# Patient Record
Sex: Female | Born: 1960
Health system: Southern US, Community
[De-identification: ages and names within clinical notes are randomized; demographics above are authoritative.]

## PROBLEM LIST (undated history)

## (undated) DIAGNOSIS — F419 Anxiety disorder, unspecified: Secondary | ICD-10-CM

## (undated) DIAGNOSIS — F411 Generalized anxiety disorder: Secondary | ICD-10-CM

## (undated) DIAGNOSIS — E079 Disorder of thyroid, unspecified: Secondary | ICD-10-CM

## (undated) DIAGNOSIS — F312 Bipolar disorder, current episode manic severe with psychotic features: Secondary | ICD-10-CM

## (undated) DIAGNOSIS — D229 Melanocytic nevi, unspecified: Secondary | ICD-10-CM

## (undated) DIAGNOSIS — Z950 Presence of cardiac pacemaker: Secondary | ICD-10-CM

## (undated) DIAGNOSIS — F209 Schizophrenia, unspecified: Secondary | ICD-10-CM

## (undated) DIAGNOSIS — I442 Atrioventricular block, complete: Secondary | ICD-10-CM

## (undated) HISTORY — DX: Anxiety disorder, unspecified: F41.9

## (undated) HISTORY — DX: Disorder of thyroid, unspecified: E07.9

## (undated) HISTORY — DX: Melanocytic nevi, unspecified: D22.9

## (undated) HISTORY — DX: Presence of cardiac pacemaker: Z95.0

## (undated) HISTORY — DX: Atrioventricular block, complete: I44.2

## (undated) HISTORY — PX: COLONOSCOPY: SHX174

---

## 1999-05-20 ENCOUNTER — Other Ambulatory Visit: Admission: RE | Admit: 1999-05-20 | Discharge: 1999-05-20 | Payer: Self-pay | Admitting: Obstetrics and Gynecology

## 1999-05-20 ENCOUNTER — Encounter (INDEPENDENT_AMBULATORY_CARE_PROVIDER_SITE_OTHER): Payer: Self-pay | Admitting: Specialist

## 2000-06-07 ENCOUNTER — Other Ambulatory Visit: Admission: RE | Admit: 2000-06-07 | Discharge: 2000-06-07 | Payer: Self-pay | Admitting: Obstetrics and Gynecology

## 2001-08-15 ENCOUNTER — Other Ambulatory Visit: Admission: RE | Admit: 2001-08-15 | Discharge: 2001-08-15 | Payer: Self-pay | Admitting: Obstetrics and Gynecology

## 2003-01-02 ENCOUNTER — Other Ambulatory Visit: Admission: RE | Admit: 2003-01-02 | Discharge: 2003-01-02 | Payer: Self-pay | Admitting: Obstetrics and Gynecology

## 2004-02-13 ENCOUNTER — Other Ambulatory Visit: Admission: RE | Admit: 2004-02-13 | Discharge: 2004-02-13 | Payer: Self-pay | Admitting: Obstetrics and Gynecology

## 2005-07-17 ENCOUNTER — Other Ambulatory Visit: Admission: RE | Admit: 2005-07-17 | Discharge: 2005-07-17 | Payer: Self-pay | Admitting: Obstetrics and Gynecology

## 2005-08-04 ENCOUNTER — Ambulatory Visit: Payer: Self-pay | Admitting: Gastroenterology

## 2005-08-19 ENCOUNTER — Ambulatory Visit: Payer: Self-pay | Admitting: Gastroenterology

## 2005-10-28 ENCOUNTER — Encounter: Payer: Self-pay | Admitting: Internal Medicine

## 2006-01-21 ENCOUNTER — Encounter: Admission: RE | Admit: 2006-01-21 | Discharge: 2006-01-21 | Payer: Self-pay | Admitting: Family Medicine

## 2006-03-05 ENCOUNTER — Ambulatory Visit: Payer: Self-pay | Admitting: Family Medicine

## 2006-04-28 ENCOUNTER — Ambulatory Visit: Payer: Self-pay | Admitting: Family Medicine

## 2006-10-17 DIAGNOSIS — F411 Generalized anxiety disorder: Secondary | ICD-10-CM | POA: Insufficient documentation

## 2006-10-29 ENCOUNTER — Ambulatory Visit: Payer: Self-pay | Admitting: Internal Medicine

## 2007-01-13 ENCOUNTER — Ambulatory Visit: Payer: Self-pay | Admitting: Internal Medicine

## 2007-01-13 LAB — CONVERTED CEMR LAB
BUN: 15 mg/dL (ref 6–23)
Basophils Absolute: 0 10*3/uL (ref 0.0–0.1)
Basophils Relative: 0 % (ref 0.0–1.0)
CO2: 29 meq/L (ref 19–32)
Calcium: 9.3 mg/dL (ref 8.4–10.5)
Chloride: 105 meq/L (ref 96–112)
Creatinine, Ser: 0.9 mg/dL (ref 0.4–1.2)
Eosinophils Absolute: 0.1 10*3/uL (ref 0.0–0.6)
Eosinophils Relative: 0.9 % (ref 0.0–5.0)
GFR calc Af Amer: 87 mL/min
GFR calc non Af Amer: 72 mL/min
Glucose, Bld: 97 mg/dL (ref 70–99)
HCT: 39.9 % (ref 36.0–46.0)
Hemoglobin: 13.2 g/dL (ref 12.0–15.0)
Lymphocytes Relative: 21.6 % (ref 12.0–46.0)
MCHC: 33 g/dL (ref 30.0–36.0)
MCV: 91.2 fL (ref 78.0–100.0)
Monocytes Absolute: 0.7 10*3/uL (ref 0.2–0.7)
Monocytes Relative: 8 % (ref 3.0–11.0)
Neutro Abs: 5.9 10*3/uL (ref 1.4–7.7)
Neutrophils Relative %: 69.5 % (ref 43.0–77.0)
Platelets: 292 10*3/uL (ref 150–400)
Potassium: 4.4 meq/L (ref 3.5–5.1)
RBC: 4.38 M/uL (ref 3.87–5.11)
RDW: 12.6 % (ref 11.5–14.6)
Sodium: 139 meq/L (ref 135–145)
TSH: 5.31 microintl units/mL (ref 0.35–5.50)
WBC: 8.5 10*3/uL (ref 4.5–10.5)

## 2007-02-03 ENCOUNTER — Ambulatory Visit: Payer: Self-pay | Admitting: Family Medicine

## 2007-03-10 ENCOUNTER — Encounter: Payer: Self-pay | Admitting: Internal Medicine

## 2007-07-04 ENCOUNTER — Ambulatory Visit: Payer: Self-pay | Admitting: Family Medicine

## 2007-07-12 ENCOUNTER — Encounter (INDEPENDENT_AMBULATORY_CARE_PROVIDER_SITE_OTHER): Payer: Self-pay | Admitting: Family Medicine

## 2007-10-12 ENCOUNTER — Ambulatory Visit: Payer: Self-pay | Admitting: Family Medicine

## 2007-10-12 LAB — CONVERTED CEMR LAB: Rapid Strep: NEGATIVE

## 2007-11-17 ENCOUNTER — Ambulatory Visit: Payer: Self-pay | Admitting: Internal Medicine

## 2008-01-09 ENCOUNTER — Ambulatory Visit: Payer: Self-pay | Admitting: Internal Medicine

## 2008-06-27 ENCOUNTER — Ambulatory Visit: Payer: Self-pay | Admitting: Internal Medicine

## 2008-07-16 ENCOUNTER — Encounter: Payer: Self-pay | Admitting: Internal Medicine

## 2008-07-24 ENCOUNTER — Telehealth (INDEPENDENT_AMBULATORY_CARE_PROVIDER_SITE_OTHER): Payer: Self-pay | Admitting: *Deleted

## 2008-10-26 ENCOUNTER — Encounter: Payer: Self-pay | Admitting: Internal Medicine

## 2009-07-03 ENCOUNTER — Ambulatory Visit: Payer: Self-pay | Admitting: Internal Medicine

## 2009-07-24 ENCOUNTER — Encounter: Payer: Self-pay | Admitting: Internal Medicine

## 2009-10-07 ENCOUNTER — Encounter: Payer: Self-pay | Admitting: Internal Medicine

## 2009-12-10 ENCOUNTER — Ambulatory Visit: Payer: Self-pay | Admitting: Internal Medicine

## 2009-12-10 ENCOUNTER — Emergency Department (HOSPITAL_BASED_OUTPATIENT_CLINIC_OR_DEPARTMENT_OTHER): Admission: EM | Admit: 2009-12-10 | Discharge: 2009-12-10 | Payer: Self-pay | Admitting: Emergency Medicine

## 2010-02-18 ENCOUNTER — Telehealth (INDEPENDENT_AMBULATORY_CARE_PROVIDER_SITE_OTHER): Payer: Self-pay | Admitting: *Deleted

## 2010-02-25 ENCOUNTER — Telehealth (INDEPENDENT_AMBULATORY_CARE_PROVIDER_SITE_OTHER): Payer: Self-pay | Admitting: *Deleted

## 2010-09-17 ENCOUNTER — Other Ambulatory Visit: Payer: Self-pay | Admitting: Internal Medicine

## 2010-09-17 ENCOUNTER — Ambulatory Visit
Admission: RE | Admit: 2010-09-17 | Discharge: 2010-09-17 | Payer: Self-pay | Source: Home / Self Care | Attending: Internal Medicine | Admitting: Internal Medicine

## 2010-09-17 ENCOUNTER — Encounter: Payer: Self-pay | Admitting: Internal Medicine

## 2010-09-17 DIAGNOSIS — R55 Syncope and collapse: Secondary | ICD-10-CM | POA: Insufficient documentation

## 2010-09-18 LAB — BASIC METABOLIC PANEL
BUN: 15 mg/dL (ref 6–23)
CO2: 29 mEq/L (ref 19–32)
Calcium: 9.5 mg/dL (ref 8.4–10.5)
Chloride: 99 mEq/L (ref 96–112)
Creatinine, Ser: 0.8 mg/dL (ref 0.4–1.2)
GFR: 77.91 mL/min (ref >60.00)
Glucose, Bld: 83 mg/dL (ref 70–99)
Potassium: 3.9 mEq/L (ref 3.5–5.1)
Sodium: 136 mEq/L (ref 135–145)

## 2010-09-18 LAB — CBC WITH DIFFERENTIAL/PLATELET
Basophils Absolute: 0 10*3/uL (ref 0.0–0.1)
Basophils Relative: 0.3 % (ref 0.0–3.0)
Eosinophils Absolute: 0 10*3/uL (ref 0.0–0.7)
Eosinophils Relative: 0.7 % (ref 0.0–5.0)
HCT: 40.9 % (ref 36.0–46.0)
Hemoglobin: 13.8 g/dL (ref 12.0–15.0)
Lymphocytes Relative: 34.6 % (ref 12.0–46.0)
Lymphs Abs: 2.5 10*3/uL (ref 0.7–4.0)
MCHC: 33.8 g/dL (ref 30.0–36.0)
MCV: 93.1 fl (ref 78.0–100.0)
Monocytes Absolute: 1 10*3/uL (ref 0.1–1.0)
Monocytes Relative: 13.3 % — ABNORMAL HIGH (ref 3.0–12.0)
Neutro Abs: 3.7 10*3/uL (ref 1.4–7.7)
Neutrophils Relative %: 51.1 % (ref 43.0–77.0)
Platelets: 287 10*3/uL (ref 150.0–400.0)
RBC: 4.39 Mil/uL (ref 3.87–5.11)
RDW: 13.4 % (ref 11.5–14.6)
WBC: 7.3 10*3/uL (ref 4.5–10.5)

## 2010-09-18 LAB — ALT: ALT: 21 U/L (ref 0–35)

## 2010-09-18 LAB — AST: AST: 20 U/L (ref 0–37)

## 2010-09-23 ENCOUNTER — Ambulatory Visit (HOSPITAL_COMMUNITY): Admission: RE | Admit: 2010-09-23 | Payer: Self-pay | Source: Home / Self Care | Admitting: Internal Medicine

## 2010-09-29 ENCOUNTER — Ambulatory Visit (HOSPITAL_COMMUNITY)
Admission: RE | Admit: 2010-09-29 | Discharge: 2010-09-29 | Payer: Self-pay | Source: Home / Self Care | Attending: Internal Medicine | Admitting: Internal Medicine

## 2010-09-29 ENCOUNTER — Ambulatory Visit: Admission: RE | Admit: 2010-09-29 | Discharge: 2010-09-29 | Payer: Self-pay | Source: Home / Self Care

## 2010-09-30 NOTE — Assessment & Plan Note (Signed)
Summary: swh   Vital Signs:  Patient profile:   50 year old female Weight:      136.6 pounds Temp:     98.8 degrees F BP sitting:   146 / 92  Vitals Entered By: Shary Decamp (December 10, 2009 12:58 PM) CC: dog bite yesterday, went to [facility], rx'd augmentin   History of Present Illness: status-post a dog bite yesterday went to the ER, had a tetanus shot, was prescribed Augmentin she already find out that the dog has been vaccinated against rabies.  Current Medications (verified): 1)  Risperdal 1 Mg Tabs (Risperidone) .Marland Kitchen.. 1 By Mouth Qhs 2)  Alprazolam 1 Mg  Tabs (Alprazolam) .... Prn 3)  Vitamins/supplements  Allergies (verified): No Known Drug Allergies  Past History:  Past Medical History: Anxiety, lost a child , sees psych not on BCP thyroid dz? getting checked at Dr Talmage Nap  (3-11)  Past Surgical History: c-section  Social History: born in [place] Married, getting dovorce  lives w/ parents  2 child, lost one   Review of Systems       denies any fever or discharge from the dog bite  Psych:  still dealing with anxiety, symptoms worse for the last 24 hours due to dog  bite.  Physical Exam  General:  alert and well-developed.   Extremities:  the stylet the left leg: Has two superficial wounds, no redness, warmness, no discharge.  Mild swelling around the wounds Psych:  Oriented X3, memory intact for recent and remote, normally interactive, good eye contact, and not depressed appearing.  slightly anxious appearing   Impression & Recommendations:  Problem # 1:  DOG BITE (ICD-E906.0) so far doing well the patient is reluctnt to take antibiotics because she took antibiotics for UTI a few weeks ago and got a yeast infection explained her that is possible that the wound gets  infected but she is still reluctant to take  augmentin  we agree that she will monitor the wound on call me if there is any signs of infection  Problem # 2:  ROUTINE GENERAL MEDICAL  EXAM@HEALTH  CARE FACL (ICD-V70.0) Td yesterday at the ER sees gyn routinely  Problem # 3:  ANXIETY (ICD-300.00) anxiety slightly worse for the last 24 hours, counseled Her updated medication list for this problem includes:    Alprazolam 1 Mg Tabs (Alprazolam) .Marland Kitchen... Prn  Complete Medication List: 1)  Risperdal 1 Mg Tabs (Risperidone) .Marland Kitchen.. 1 by mouth qhs 2)  Alprazolam 1 Mg Tabs (Alprazolam) .... Prn 3)  Vitamins/supplements    Immunization History:  Tetanus/Td Immunization History:    Tetanus/Td:  [facility] (12/09/2009)

## 2010-09-30 NOTE — Progress Notes (Signed)
  Phone Note Other Incoming   Request: Send information Summary of Call: Request for records received from [facility]. Request forwarded to [facility].     

## 2010-09-30 NOTE — Progress Notes (Signed)
Summary: INS PAPERWORK TO BE FILLED OUT  Phone Note Call from Patient Call back at Home Phone [phone]   Summary of Call: PATIENT DROPPED OFF FORM TO BE FILLED OUT BY DR PAZ ABOUT HER DOG BITE FROM APRIL 2011---  HER INSURANCE COMPANY NEEDS IT  WILL TAKE TO [facility] [place] Initial call taken by: Jerolyn Shin,  February 18, 2010 11:24 AM  Follow-up for Phone Call        given to dr paz awaiting signature.................Marland KitchenFelecia Deloach CMA  February 19, 2010 10:38 AM   done Torrington E. Paz MD  February 21, 2010 3:06 PM   Additional Follow-up for Phone Call Additional follow up Details #1::        Pt is aware paperwork is upfront for her to pick up. Army Fossa CMA  February 25, 2010 5:00 PM

## 2010-09-30 NOTE — Letter (Signed)
Summary: [facility]   Imported By: Lanelle Bal 12/18/2009 10:19:44  _____________________________________________________________________  External Attachment:    Type:   Image     Comment:   External Document

## 2010-10-02 NOTE — Assessment & Plan Note (Signed)
Summary: FOR FAINTING//PH   Vital Signs:  Patient profile:   50 year old female Weight:      154.13 pounds Pulse rate:   80 / minute Pulse rhythm:   regular BP sitting:   122 / 80  (left arm) Cuff size:   regular  Vitals Entered By: Army Fossa CMA (September 17, 2010 2:53 PM) CC: Pt here "fainted" last week at gym. Comments refill xanax [facility] [address]    History of Present Illness: a week ago, the patient was jogging  in the treadmil  for 8 minutes then suddenly she felt flushed and immediately after she fainted, landed on her face, no other injuries. She was immediately assisted , LOC lasted seconds (?) . There were  no associated seizure, postictal state,tongue bite. no bladder or bowel incontinence. she used to run quite a bit but this is the first time she exercises since October 2011. Prior to the syncope, she had 2 cups of coffee and a muffin  ROS No recent fever or fatigue No recent nausea, vomiting, diarrhea No taking any new medicines No chest pain or palpitations No recent headache, slurred speech or motor deficits   Current Medications (verified): 1)  Risperdal 1 Mg Tabs (Risperidone) .Marland Kitchen.. 1 By Mouth Qhs 2)  Alprazolam 1 Mg  Tabs (Alprazolam) .... Prn  Allergies (verified): No Known Drug Allergies  Past History:  Past Medical History: Reviewed history from 12/10/2009 and no changes required. Anxiety, lost a child , sees psych not on BCP thyroid dz? getting checked at Dr Talmage Nap  (3-11)  Past Surgical History: Reviewed history from 12/10/2009 and no changes required. c-section  Family History: CAD--no DM-- +  Social History: born in [place] divorced  lives w/ parents  2 child, lost one  tobacco-- no ETOH-- socially  Physical Exam  General:  alert and well-developed.   Neck:  no masses and no thyromegaly.   Lungs:  normal respiratory effort, no intercostal retractions, no accessory muscle use, and normal breath sounds.   Heart:  normal  rate, regular rhythm, no murmur, and no gallop.   Abdomen:  soft, normal bowel sounds, no distention, no masses, no guarding, and no rigidity.   Extremities:  nuo  lower extremity edema Neurologic:  alert & oriented X3, strength normal in all extremities, and gait normal.   Psych:  not anxious appearing and not depressed appearing.     Impression & Recommendations:  Problem # 1:  SYNCOPE (ICD-780.2) syncope after a 8 minute jog. no evidence of seizure  by history, CV review of systems essentially negative. EKG LBBB, no old EKGs Plan: Aspirin Echocardiogram cardiology referral ER if symptoms recur  ------------------------------addendum: paper chart reviewed, she had a LBBB before. plan is the same  Orders: Venipuncture (04540) TLB-BMP (Basic Metabolic Panel-BMET) (80048-METABOL) TLB-CBC Platelet - w/Differential (85025-CBCD) TLB-ALT (SGPT) (84460-ALT) TLB-AST (SGOT) (84450-SGOT) Specimen Handling (98119) EKG w/ Interpretation (93000) Cardiology Referral (Cardiology) Cardiology Referral (Cardiology)  Problem # 2:  ANXIETY (ICD-300.00) per psych, request a RF of xanax, ok #30 Her updated medication list for this problem includes:    Alprazolam 1 Mg Tabs (Alprazolam) .Marland Kitchen... Prn  Complete Medication List: 1)  Risperdal 1 Mg Tabs (Risperidone) .Marland Kitchen.. 1 by mouth qhs 2)  Alprazolam 1 Mg Tabs (Alprazolam) .... Prn 3)  Aspirin 81 Mg Tbec (Aspirin) .... One by mouth daily  Patient Instructions: 1)  go to the ER if you faint  again 2)  Your due for a complete physical at your  convenience Prescriptions: ALPRAZOLAM 1 MG  TABS (ALPRAZOLAM) prn  #30 x 0   Entered and Authorized by:   Nolon Rod. Ermalinda Joubert MD   Signed by:   Nolon Rod. Shanaiya Bene MD on 09/17/2010   Method used:   Print then Give to Patient   RxID:   [id number]    Orders Added: 1)  Venipuncture [14782] 2)  TLB-BMP (Basic Metabolic Panel-BMET) [80048-METABOL] 3)  TLB-CBC Platelet - w/Differential [85025-CBCD] 4)  TLB-ALT (SGPT)  [84460-ALT] 5)  TLB-AST (SGOT) [84450-SGOT] 6)  Specimen Handling [99000] 7)  EKG w/ Interpretation [93000] 8)  Cardiology Referral [Cardiology] 9)  Cardiology Referral [Cardiology] 10)  Est. Patient Level IV [95621]

## 2010-10-09 ENCOUNTER — Encounter: Payer: Self-pay | Admitting: Cardiology

## 2010-10-09 ENCOUNTER — Ambulatory Visit (INDEPENDENT_AMBULATORY_CARE_PROVIDER_SITE_OTHER): Payer: BC Managed Care – PPO | Admitting: Cardiology

## 2010-10-09 DIAGNOSIS — I5022 Chronic systolic (congestive) heart failure: Secondary | ICD-10-CM | POA: Insufficient documentation

## 2010-10-13 ENCOUNTER — Encounter (INDEPENDENT_AMBULATORY_CARE_PROVIDER_SITE_OTHER): Payer: BC Managed Care – PPO

## 2010-10-13 ENCOUNTER — Other Ambulatory Visit: Payer: Self-pay

## 2010-10-13 ENCOUNTER — Encounter: Payer: Self-pay | Admitting: Cardiology

## 2010-10-13 ENCOUNTER — Other Ambulatory Visit (INDEPENDENT_AMBULATORY_CARE_PROVIDER_SITE_OTHER): Payer: BC Managed Care – PPO

## 2010-10-13 DIAGNOSIS — I509 Heart failure, unspecified: Secondary | ICD-10-CM

## 2010-10-13 DIAGNOSIS — R55 Syncope and collapse: Secondary | ICD-10-CM

## 2010-10-13 DIAGNOSIS — I5023 Acute on chronic systolic (congestive) heart failure: Secondary | ICD-10-CM | POA: Insufficient documentation

## 2010-10-13 DIAGNOSIS — I5022 Chronic systolic (congestive) heart failure: Secondary | ICD-10-CM

## 2010-10-13 LAB — IBC PANEL
Iron: 113 ug/dL (ref 42–145)
Saturation Ratios: 33.7 % (ref 20.0–50.0)

## 2010-10-13 LAB — CBC WITH DIFFERENTIAL/PLATELET
Basophils Absolute: 0 10*3/uL (ref 0.0–0.1)
Lymphocytes Relative: 20.8 % (ref 12.0–46.0)
Monocytes Relative: 6.9 % (ref 3.0–12.0)
Platelets: 265 10*3/uL (ref 150.0–400.0)
RDW: 13 % (ref 11.5–14.6)

## 2010-10-13 LAB — BRAIN NATRIURETIC PEPTIDE: Pro B Natriuretic peptide (BNP): 28.8 pg/mL (ref 0.0–100.0)

## 2010-10-13 LAB — FERRITIN: Ferritin: 46.3 ng/mL (ref 10.0–291.0)

## 2010-10-13 LAB — BASIC METABOLIC PANEL
CO2: 27 mEq/L (ref 19–32)
Calcium: 9.1 mg/dL (ref 8.4–10.5)
Chloride: 99 mEq/L (ref 96–112)
Sodium: 141 mEq/L (ref 135–145)

## 2010-10-13 LAB — PROTIME-INR: INR: 1 ratio (ref 0.8–1.0)

## 2010-10-14 LAB — TSH: TSH: 3.83 u[IU]/mL (ref 0.35–5.50)

## 2010-10-16 ENCOUNTER — Telehealth (INDEPENDENT_AMBULATORY_CARE_PROVIDER_SITE_OTHER): Payer: Self-pay | Admitting: *Deleted

## 2010-10-16 ENCOUNTER — Telehealth: Payer: Self-pay | Admitting: Cardiology

## 2010-10-16 NOTE — Assessment & Plan Note (Signed)
Summary: np6 syncope pt has bcbs. gd / per renee office [phone]/gd / akw  Medications Added ALPRAZOLAM 1 MG  TABS (ALPRAZOLAM) one daily prn TYLENOL PM EXTRA STRENGTH 500-25 MG TABS (DIPHENHYDRAMINE-APAP (SLEEP)) take one tablet as needed COREG 3.125 MG TABS (CARVEDILOL) one twice a day LISINOPRIL 5 MG TABS (LISINOPRIL) one-half tablet daily      Allergies Added: ! Traci Sermon  Primary Provider:  Dr. Clotilde Dieter  CC:  syncope-.  History of Present Illness: 50 yo with history of LBBB and schizophreniform disorder presents for evaluation of abnormal echocardiogram and syncopal episode.  Several weeks ago in 1/12 while walking on a treadmill, patient felt lightheaded and flushed and passed out.  She landed on her face.  She was unconscious only for a few seconds she thinks.  She has had no syncopal or presyncopal episodes prior or since then.  She does not get exertional dyspnea or chest pain.  She walks for exercise.  She does not smoke.  She had a stress test about 4 years ago in [facility] but is not sure why (possibly because of LBBB).    As part of the workup for her syncopal episode, she had an echocardiogram.  This showed diffuse global LV hypokinesis.  EF was reported as 35-40%.  It looks more like 30% at the most to me.  As mentioned above, she has no history of chest pain.  No recent URI-type symptoms.  No history of HTN.  No family history of cardiomyopathy.  She has a LBBB on ECG.  Per Dr. Leta Jungling notes, this has been found in the past as well.   ECG: NSR, LBBB  Labs (1/12): K 3.9, creatinine 0.8  Current Medications (verified): 1)  Risperdal 1 Mg Tabs (Risperidone) .Marland Kitchen.. 1 By Mouth Qhs 2)  Alprazolam 1 Mg  Tabs (Alprazolam) .... Prn 3)  Aspirin 81 Mg Tbec (Aspirin) .... One By Mouth Daily 4)  Tylenol Pm Extra Strength 500-25 Mg Tabs (Diphenhydramine-Apap (Sleep)) .... Take One Tablet As Needed  Allergies (verified): 1)  ! * Clarinex  Past History:  Past Medical History: 1.  Anxiety 2. 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 thyroid dz? getting checked at Dr Talmage Nap  (3-11) 4. LBBB: This seems to be chronic. 5. Cardiomyopathy: Echo (1/12) with diffuse hypokinesis and EF reported as 35-40% (looks like 30% at the most to me), grade I diastolic dysfunction, mild mitral regurgitation.  6. Syncope while walking on treadmill.   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   Vital Signs:  Patient profile:   50 year old female Height:      62 inches Weight:      154 pounds BMI:     28.27 Pulse rate:   78 / minute Pulse (ortho):   82 / minute Pulse rhythm:   regular BP sitting:   110 / 70  (left arm) BP standing:   109 / 75  Vitals Entered By: Judithe Modest CMA (October 09, 2010 12:15 PM)  Serial Vital Signs/Assessments:  Time      Position  BP       Pulse  Resp  Temp     By 12:35  PM  Lying LA  118/82   75                    Judithe Modest CMA 12:35 PM  Sitting   109/75   76                    Judithe Modest CMA 12:35 PM  Standing  109/75   82                    Amanda Trulove CMA  Comments: 12:35 PM 2 minutes-102/77 HR 95 3 minutes-100/73 HR 98  Pt reported that she felt normal except for feeling a little anxious By: Judithe Modest CMA    Physical Exam  General:  Well developed, well nourished, in no acute distress. Head:  normocephalic and atraumatic Nose:  no deformity, discharge, inflammation, or lesions Mouth:  Teeth, gums and palate normal. Oral mucosa normal. Neck:  Neck supple, no JVD. No masses, thyromegaly or abnormal cervical nodes. Lungs:  Clear bilaterally to auscultation and percussion. Heart:  Non-displaced PMI, chest non-tender;  regular rate and rhythm, S1, S2 without murmurs, rubs or gallops. Carotid upstroke normal, no bruit. Pedals normal pulses. No edema, no varicosities. Abdomen:  Bowel sounds positive; abdomen soft and non-tender without masses, organomegaly, or hernias noted. No hepatosplenomegaly. Extremities:  No clubbing or cyanosis. Neurologic:  Alert and oriented x 3. Skin:  Intact without lesions or rashes. Psych:  Normal affect.   Impression & Recommendations:  Problem # 1:  CHRONIC SYSTOLIC HEART FAILURE (ICD-428.22) Dilated cardiomyopathy with EF appearing to be at most 30% by echo.  She has a LBBB which is chronic (per Dr. Leta Jungling notes, was seen on another ECG in the paper chart).  She has no significant chest pain or exertional dyspnea.  She is not volume overloaded on exam.  The cause of her cardiomyopathy is not readily apparent.  She does not have any significant risk factors for CAD.  She will need to start on medications to try to strengthen the heart and we will explore potential causes of her cardiomyopathy.  Differential includes CAD, prior myocarditis, infiltrative disease, familial cardiomyopathy (though no family member with known cardiomyopathy).  - Left heart catheterization to definitively rule in or out CAD.  - Check TSH, iron studies, SPEP, HIV, ANA - If LHC shows no coronary disease, would consider cardiac MRI to assess for infiltrative disease or myocarditis.  - Start Coreg 3.125 mg two times a day and lisinopril 2.5 mg daily.   Problem # 2:  SYNCOPE (ICD-780.2) Worrisome that this occurred in the setting of exertion, a LBBB, and a dilated cardiomyopathy.  Would certainly be very concerned about the possibility of VT.  Will get a 3 week event monitor.    Other Orders: Cardiac Catheterization (Cardiac Cath) Event (Event)  Patient Instructions: 1)  Your physician has recommended you make the following change in your medication:  2)  Start Coreg(carvedilol) 3.125mg  twice a day. 3)   Start Lisinopril 2.5mg  daily--this will be one-half of a 5mg  tablet daily. 4)  Return for lab Monday or Tuesday of next week---BMP-CBC-PT-TSH-ANA-Iron-IBC-Ferritin-BNP-HIV--428.22 5)  Your physician has requested that you have a cardiac catheterization.  Cardiac catheterization is used to diagnose and/or treat various heart conditions. Doctors may recommend this procedure for a number of different reasons. The most common reason is to evaluate chest pain. Chest pain can be a symptom of coronary artery disease (CAD), and cardiac catheterization can show  whether plaque is narrowing or blocking your heart's arteries. This procedure is also used to evaluate the valves, as well as measure the blood flow and oxygen levels in different parts of your heart.  For further information please visit [email or web address]  Please follow instruction sheet, as given. 6)  FEBRUARY 16,1096 7)  Your physician recommends that you schedule a follow-up appointment in: 2-3 weeks with Dr Shirlee Latch. Prescriptions: LISINOPRIL 5 MG TABS (LISINOPRIL) one-half tablet daily  #15 x 6   Entered by:   Katina Dung, RN, BSN   Authorized by:   Marca Ancona, MD   Signed by:   Katina Dung, RN, BSN on 10/09/2010   Method used:   Electronically to        [facility] #[id number]* (retail)       [address]       [place], [place]  [address]       Ph: [phone]       Fax: [phone]   RxID:   [id number] COREG 3.125 MG TABS (CARVEDILOL) one twice a day  #60 x 6   Entered by:   Katina Dung, RN, BSN   Authorized by:   Marca Ancona, MD   Signed by:   Katina Dung, RN, BSN on 10/09/2010   Method used:   Electronically to        [facility]. #[id number]* (retail)       [address]       [place], [place]  [address]       Ph: [phone]       Fax: [phone]   RxID:   [id number] ALPRAZOLAM 1 MG  TABS (ALPRAZOLAM) one daily prn  #30 x  0   Entered by:   Katina Dung, RN, BSN   Authorized by:   Marca Ancona, MD   Signed by:   Katina Dung, RN, BSN on 10/09/2010   Method used:   Print then Give to Patient   RxID:   [id number]

## 2010-10-16 NOTE — Letter (Signed)
Summary: Cardiac Catheterization Instructions- [facility]  [facility], Main Office  [address]   [place], [place] [address]   Phone: [phone]  Fax: [phone]     10/09/2010 MRN: [id number]  [facility] [address] [place], [place]  [address]  [place]  Dear Ms. DROEGE,   You are scheduled for a Cardiac Catheterization on Friday February 17,2012 with Dr. Marca Ancona.  Please arrive to the 1st floor of the Heart and Vascular Center at [facility] at 8:30 am  on the day of your procedure. Please do not arrive before 6:30 a.m. Call the Heart and Vascular Center at [phone] if you are unable to make your appointmnet. The Code to get into the parking garage under the building is 0300. Take the elevators to the 1st floor. You must have someone to drive you home. Someone must be with you for the first 24 hours after you arrive home. Please wear clothes that are easy to get on and off and wear slip-on shoes. Do not eat or drink after midnight except water with your medications that morning. Bring all your medications and current insurance cards with you.    _x__ You may take ALL of your medications with water that morning.   The usual length of stay after your procedure is 2 to 3 hours. This can vary.  If you have any questions, please call the office at the number listed above.   Katina Dung, RN, BSN

## 2010-10-17 ENCOUNTER — Telehealth (INDEPENDENT_AMBULATORY_CARE_PROVIDER_SITE_OTHER): Payer: Self-pay | Admitting: *Deleted

## 2010-10-17 ENCOUNTER — Encounter: Payer: Self-pay | Admitting: Cardiology

## 2010-10-17 LAB — CONVERTED CEMR LAB
Alpha-1-Globulin: 4.1 % (ref 2.9–4.9)
Alpha-2-Globulin: 9.9 % (ref 7.1–11.8)
Gamma Globulin: 16.1 % (ref 11.1–18.8)

## 2010-10-21 ENCOUNTER — Other Ambulatory Visit: Payer: Self-pay | Admitting: Cardiology

## 2010-10-22 NOTE — Miscellaneous (Signed)
Summary: Orders Update  Clinical Lists Changes  Orders: Added new Test order of T-Antinuclear Antib (ANA) [id number]) - Signed Added new Test order of T-Immunofixation Electrophoresis, Serum  ([id number]) - Signed Added new Test order of T-HIV-1 (Screen) [id number]) - Signed

## 2010-10-22 NOTE — Progress Notes (Signed)
Summary: Medical records request  Phone Note Other Incoming   Caller: Noreene Larsson @ [facility]--[phone] --ok to leave message Summary of Call: Noreene Larsson called from law office to check/confirm that we have received medical records request on the above patient. She notes that it has been sent several times (3 or more) and initial request was in 01/2010.   I know things like this go to [place], ?How do we confirm something like this? Please advise. Initial call taken by: Lucious Groves CMA,  October 16, 2010 12:32 PM  Follow-up for Phone Call        effective today, we can give out the following name and number to anyone calling to check on medical records:  Luster Landsberg at [facility]  [phone]  UNLESS the request  was just faxed  in the last 3-4 business days--the paperwork and / or paper chart may still be in the "basket" near Carol's desk waiting to be sent over to [facility]--we try to send the batch over to [facility] every 2-3 days Follow-up by: Jerolyn Shin,  October 16, 2010 3:27 PM  Additional Follow-up for Phone Call Additional follow up Details #1::        Ok, per the lady they have made 3+ requests. We need to call her ASAP to confirm that this has been received/initiated. Lucious Groves CMA  October 16, 2010 3:31 PM       Additional Follow-up for Phone Call Additional follow up Details #2::    See phone note from [place] at [facility].Marland KitchenMarland KitchenJerolyn Shin  October 17, 2010 2:50 PM

## 2010-10-22 NOTE — Progress Notes (Signed)
Summary: medical records response from [facility] at [facility]  ---- Converted from flag ---- ---- 10/17/2010 11:17 AM, Carlynn Spry wrote: Per Phone note on 10/16/10 on pt request for records on [employer]armaine [employer] from [employer].Maralyn Sago we would not have been holding a request over here from June. If it was from so far back it should have been brought to my attn. when they said second request. I did speak to the lawyers office concerning this request and looking in the EMR we never received the 1st or 2nd request, I did let the lawyers ofc. know that I would be completeing the request today and asked her to call [employer] customer Service on Monday with your site no.#.  I am doing these records as I am typeing you this flag. Noreene Larsson @ [employer] was okay with the solution.  If you have any further questions please give me a call.  Thanks, Luster Landsberg' [employer] ------------------------------

## 2010-10-22 NOTE — Progress Notes (Signed)
Summary: Reschedule cath   Phone Note Call from Patient   Caller: Patient Summary of Call: I spoke with the pt and she is scheduled for a cardiac cath tomorrow.  The pt said her mother was taken to the ER at [facility] this week and is now in the ICU on a ventilator and the family has to meet tomorrow to discuss taking the pt off of ventilator.  The pt is under a great deal of stress and would like to reschedule cardiac cath to 10/31/10  if possible.  The pt's sister is going to be in town through 11/03/10 and she would be able to provide transportation. I spoke with Casimiro Needle in the [facility] lab and canceled cath for 10/17/10.  I will forward this message to [facility] and Dr Shirlee Latch to see if the pt's cardiac cath can be arranged on 10/31/10 (MD is [facility]).  I made the pt aware that Thurston Hole would call her with new instructions for cath once this is rescheduled.    Initial call taken by: Julieta Gutting, RN, BSN,  October 16, 2010 8:49 AM     Appended Document: Reschedule cath 3/2 reschedule would be ok with me.   Appended Document: Reschedule cath I rescheduled cath to 10/31/10 1:30 [facility]--pt is aware --pt will return for BMP/CBC/PT  10/27/10

## 2010-10-22 NOTE — Letter (Signed)
Summary: Cardiac Catheterization Instructions- [facility]  [facility], Main Office  [address]   [place], [place] [address]   Phone: [phone]  Fax: [phone]     10/17/2010 MRN: [id number]  [facility] [address] [place], [place]  [address]  [place]  Dear Ms. COMLEY,   You are scheduled for a Cardiac Catheterization on Friday March 2,2012 with Dr. Marca Ancona.  Please arrive to the 1st floor of the Heart and Vascular Center at [facility] at 12:30 pm on the day of your procedure. Please do not arrive before 6:30 a.m. Call the Heart and Vascular Center at [phone] if you are unable to make your appointmnet. The Code to get into the parking garage under the building is 3000. Take the elevators to the 1st floor. You must have someone to drive you home. Someone must be with you for the first 24 hours after you arrive home. Please wear clothes that are easy to get on and off and wear slip-on shoes. Do not eat after midnight, you can have clear liquids until 7:30am. Nothing to eat or drink after 7:30am except water with your medications. Bring all your medications and current insurance cards with you.       [facility] [address] FOR LAB ON MONDAY        FEBRUARY 27,2012.   The usual length of stay after your procedure is 2 to 3 hours. This can vary.  If you have any questions, please call the office at the number listed above.   Katina Dung, RN, BSN

## 2010-10-24 ENCOUNTER — Other Ambulatory Visit (INDEPENDENT_AMBULATORY_CARE_PROVIDER_SITE_OTHER): Payer: BC Managed Care – PPO

## 2010-10-24 ENCOUNTER — Encounter (INDEPENDENT_AMBULATORY_CARE_PROVIDER_SITE_OTHER): Payer: Self-pay | Admitting: *Deleted

## 2010-10-24 ENCOUNTER — Other Ambulatory Visit: Payer: Self-pay

## 2010-10-24 DIAGNOSIS — I5022 Chronic systolic (congestive) heart failure: Secondary | ICD-10-CM

## 2010-10-24 DIAGNOSIS — R55 Syncope and collapse: Secondary | ICD-10-CM

## 2010-10-24 LAB — CBC WITH DIFFERENTIAL/PLATELET
Basophils Relative: 0.5 % (ref 0.0–3.0)
Eosinophils Absolute: 0.2 10*3/uL (ref 0.0–0.7)
HCT: 39.8 % (ref 36.0–46.0)
Lymphs Abs: 2 10*3/uL (ref 0.7–4.0)
MCHC: 34.3 g/dL (ref 30.0–36.0)
MCV: 91.9 fl (ref 78.0–100.0)
Monocytes Absolute: 0.8 10*3/uL (ref 0.1–1.0)
Neutrophils Relative %: 60.1 % (ref 43.0–77.0)
Platelets: 280 10*3/uL (ref 150.0–400.0)
RBC: 4.32 Mil/uL (ref 3.87–5.11)

## 2010-10-24 LAB — BASIC METABOLIC PANEL
BUN: 13 mg/dL (ref 6–23)
CO2: 29 mEq/L (ref 19–32)
Chloride: 106 mEq/L (ref 96–112)
Creatinine, Ser: 0.7 mg/dL (ref 0.4–1.2)

## 2010-10-24 LAB — PROTIME-INR: Prothrombin Time: 11.7 s (ref 10.2–12.4)

## 2010-10-27 ENCOUNTER — Other Ambulatory Visit: Payer: BC Managed Care – PPO

## 2010-10-28 ENCOUNTER — Ambulatory Visit: Payer: BC Managed Care – PPO | Admitting: Cardiology

## 2010-10-29 ENCOUNTER — Telehealth: Payer: Self-pay | Admitting: Cardiology

## 2010-10-31 ENCOUNTER — Inpatient Hospital Stay (HOSPITAL_BASED_OUTPATIENT_CLINIC_OR_DEPARTMENT_OTHER)
Admission: RE | Admit: 2010-10-31 | Discharge: 2010-10-31 | Disposition: A | Discharge status: Home / Self Care | Payer: BC Managed Care – PPO | Source: Ambulatory Visit | Attending: Cardiovascular Disease | Admitting: Cardiovascular Disease

## 2010-10-31 DIAGNOSIS — I447 Left bundle-branch block, unspecified: Secondary | ICD-10-CM | POA: Insufficient documentation

## 2010-10-31 DIAGNOSIS — I428 Other cardiomyopathies: Secondary | ICD-10-CM | POA: Insufficient documentation

## 2010-10-31 DIAGNOSIS — I509 Heart failure, unspecified: Secondary | ICD-10-CM | POA: Insufficient documentation

## 2010-11-03 DIAGNOSIS — I428 Other cardiomyopathies: Secondary | ICD-10-CM | POA: Insufficient documentation

## 2010-11-06 ENCOUNTER — Other Ambulatory Visit: Payer: Self-pay | Admitting: Cardiology

## 2010-11-06 ENCOUNTER — Encounter (INDEPENDENT_AMBULATORY_CARE_PROVIDER_SITE_OTHER): Payer: Self-pay | Admitting: *Deleted

## 2010-11-06 NOTE — Progress Notes (Signed)
Summary: question on  Cardiac Catheterization   Phone Note Call from Patient Call back at Home Phone [phone]   Caller: Patient Reason for Call: Talk to Nurse Summary of Call: pt has question re when she can go back to work. when she get the  Cardiac Catheterization procedure done on friday. Initial call taken by: Roe Coombs,  October 29, 2010 12:20 PM  Follow-up for Phone Call        Pt. is scheduled for cath on Friday, October 31, 2010. 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 I told pt she should plan on taking Saturday off and could probably return to work on Monday depending on outcome of cath.  I told pt that Dr. Shirlee Latch would give her definite instructions on when she could return to work after cath done. Follow-up by: Dossie Arbour, RN, BSN,  October 29, 2010 1:55 PM

## 2010-11-07 NOTE — Procedures (Signed)
  NAMESIMCHA, FARRINGTON          ACCOUNT NO.:  [id number]  MEDICAL RECORD NO.:  [id number]          PATIENT TYPE:  OUT  LOCATION:  [facility]                         FACILITY:  [facility]  PHYSICIAN:  Marca Ancona, MD      DATE OF BIRTH:  Feb 14, 1961  DATE OF PROCEDURE:  10/31/2010 DATE OF DISCHARGE:  09/29/2010                           CARDIAC CATHETERIZATION   PROCEDURES: 1. Left heart catheterization. 2. Coronary angiography. 3. Left ventriculography.  INDICATIONS:  This is a 50 year old who was found by echo to have an EF of about 30%.  She does not have significant symptomatology.  She has left bundle-branch block.  We are doing left heart catheterization today to try to find the etiology of her cardiomyopathy.  PROCEDURE NOTE:  After informed consent was obtained, the right groin was sterilely prepped and draped.  Lidocaine 1% was used to locally anesthetize the right groin area.  The right common femoral artery was entered using modified Seldinger technique and a 5-French arterial sheath was placed.  Right coronary artery was engaged using the 3-D RCA catheter.  The left coronary artery was engaged using JL-4 catheter. The left ventricle was entered using angled pigtail catheter.  There was no complications.  FINDINGS: 1. Hemodynamics.  LV 140/50 and aorta 143/80. 2. Left ventriculography.  EF was estimated 45-50% with mildly     decreased global function. 3. Right coronary artery.  The right coronary artery was dominant     vessel with no angiographic coronary artery disease. 4. Left main.  The left main had no angiographic coronary artery     disease. 5. Left circumflex system.  The left circumflex system had no     angiographic coronary artery disease. 6. LAD system.  The LAD system had a moderate-to-large first diagonal     and no angiographic coronary artery disease.  IMPRESSION:  This is a 50 year old with echocardiogram showing EF of about 30% and left  bundle-branch block who was sent for left heart catheterization today to rule out coronary artery disease as a cause of the cardiomyopathy.  There is no angiographic coronary disease.  EF is estimated to be 45-50% by left ventriculography with diffuse hypokinesis.  I suspect this is a nonischemic cardiomyopathy.  The patient does seem to have made some improvement in her LV systolic function.  We will get a cardiac MRI to rule out infiltrative process or any sign of myocarditis.     Marca Ancona, MD     DM/MEDQ  D:  10/31/2010  T:  11/01/2010  Job:  [id number]  cc:   Willow Ora, MD  Electronically Signed by Marca Ancona MD on 11/06/2010 09:12:41 AM

## 2010-11-10 ENCOUNTER — Ambulatory Visit: Payer: BC Managed Care – PPO | Admitting: Cardiology

## 2010-11-11 NOTE — Letter (Signed)
Summary: Appointment - Cardiac MRI  [facility], Main Office  [address]   [place], [place] [address]   Phone: [phone]  Fax: [phone]      November 06, 2010 MRN: [id number]   [facility] [address] [place], [place]  [address]   Dear Ms. HARDWICK,   We have scheduled the above patient for an appointment for a Cardiac MRI on 11-19-2010 at 9:00 a.m.  Please refer to the below information for the location and instructions for this test:  Location:     [facility]       [address]       [place], [place]  [address] Instructions:    Wil[facility] Arms at [facility] Outpatient Registration 45 minutes prior to your appointment time.  This will ensure you are in the Radiology Department 30 minutes prior to your appointment.    There are no restrictions for this test you may eat and take medications as usual.  If you need to reschedule this appointment please call at the number listed above.  Sincerely,      Lorne Skeens  [facility] Scheduling Team

## 2010-11-17 ENCOUNTER — Inpatient Hospital Stay (HOSPITAL_COMMUNITY): Admission: RE | Admit: 2010-11-17 | Payer: BC Managed Care – PPO | Source: Ambulatory Visit

## 2010-11-17 ENCOUNTER — Other Ambulatory Visit (HOSPITAL_COMMUNITY): Payer: BC Managed Care – PPO

## 2010-11-18 ENCOUNTER — Telehealth: Payer: Self-pay | Admitting: *Deleted

## 2010-11-18 NOTE — Telephone Encounter (Signed)
Dr Shirlee Latch reviewed monitor done 10/13/10-11/11/10. NSR.  Pt given results by telephone

## 2010-11-19 ENCOUNTER — Ambulatory Visit (HOSPITAL_COMMUNITY)
Admission: RE | Admit: 2010-11-19 | Discharge: 2010-11-19 | Disposition: A | Discharge status: Home / Self Care | Payer: BC Managed Care – PPO | Source: Ambulatory Visit | Attending: Cardiology | Admitting: Cardiology

## 2010-11-19 DIAGNOSIS — I517 Cardiomegaly: Secondary | ICD-10-CM | POA: Insufficient documentation

## 2010-11-19 DIAGNOSIS — I428 Other cardiomyopathies: Secondary | ICD-10-CM | POA: Insufficient documentation

## 2010-11-19 MED ORDER — GADOPENTETATE DIMEGLUMINE 469.01 MG/ML IV SOLN
35.0000 mL | Freq: Once | INTRAVENOUS | Status: AC
  Administered 2010-11-19: 35 mL via INTRAVENOUS

## 2010-11-20 ENCOUNTER — Encounter: Payer: Self-pay | Admitting: Cardiology

## 2010-11-24 ENCOUNTER — Telehealth: Payer: Self-pay | Admitting: Cardiology

## 2010-11-24 NOTE — Telephone Encounter (Signed)
Pt calling re mri results. Pt states a nurse called re results. Pt # [phone].

## 2010-11-24 NOTE — Telephone Encounter (Signed)
MRI results and MD's recommendations given  Pt. States has a  post cardiac cath appointment on  12/08/10.

## 2010-11-26 ENCOUNTER — Telehealth: Payer: Self-pay | Admitting: Cardiology

## 2010-11-26 NOTE — Telephone Encounter (Signed)
I talked with pt by telephone 

## 2010-12-08 ENCOUNTER — Ambulatory Visit (INDEPENDENT_AMBULATORY_CARE_PROVIDER_SITE_OTHER): Payer: BC Managed Care – PPO | Admitting: Cardiology

## 2010-12-08 ENCOUNTER — Encounter: Payer: Self-pay | Admitting: Internal Medicine

## 2010-12-08 ENCOUNTER — Encounter: Payer: Self-pay | Admitting: Cardiology

## 2010-12-08 VITALS — BP 154/88 | HR 73 | Ht 62.0 in | Wt 149.0 lb

## 2010-12-08 DIAGNOSIS — I5022 Chronic systolic (congestive) heart failure: Secondary | ICD-10-CM

## 2010-12-08 DIAGNOSIS — R55 Syncope and collapse: Secondary | ICD-10-CM

## 2010-12-08 MED ORDER — CARVEDILOL 6.25 MG PO TABS: 6.2500 mg | ORAL_TABLET | Freq: Two times a day (BID) | ORAL | Status: DC

## 2010-12-08 MED ORDER — LISINOPRIL 5 MG PO TABS: 5.0000 mg | ORAL_TABLET | Freq: Every day | ORAL | Status: DC

## 2010-12-08 NOTE — Patient Instructions (Signed)
Increase Coreg(carvedilol) to 6.25mg  twice a day.  Increase Lisinopril to 5mg  daily.  Lab in 2 weeks--BMP 428.22  Schedule an appointment to see Dr Shirlee Latch in 3 months.(July 2012)  Schedule an appointment for an echocardiogram in 6 months. (October 2012)

## 2010-12-09 NOTE — Assessment & Plan Note (Signed)
Three week event monitor showed no worrisome arrhythmia.  Will follow carefully.

## 2010-12-09 NOTE — Progress Notes (Signed)
50 yo with history of LBBB and schizophreniform disorder presented initially for evaluation of abnormal echocardiogram and syncopal episode. In 1/12 while walking on a treadmill, patient felt lightheaded and flushed and passed out.  She landed on her face.  She was unconscious only for a few seconds she thinks.  She has had no syncopal or presyncopal episodes prior or since then.  She does not get exertional dyspnea or chest pain.  She walks for exercise.  She does not smoke.   As part of the workup for her syncopal episode, she had an echocardiogram.  This showed diffuse global LV hypokinesis.  EF was reported as 35-40%.  It looked more like 30% at the most to me.  As mentioned above, she has no history of chest pain.  No recent URI-type symptoms.  No history of HTN.  No family history of cardiomyopathy.  She had a LBBB on ECG.  This had been seen in the past as well.   Left heart cath in 3/12 showed EF improved somewhat to 45-50% and no angiographic coronary disease.  Cardiac MRI showed EF 43% with no delayed enhancement.  3 week event monitor showed no significant arrhythmias.   ECG: NSR, LBBB  Labs (1/12): K 3.9, creatinine 0.8 Labs (2/12): K 4.4, creatinine 0.7, BNP 29, SPEP negative, ANA weakly positive (1:40), HIV negative, transferrin saturation 34%.   Allergies (verified):  1)  ! * Clarinex  Past Medical History: 1. Anxiety 2. 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 thyroid dz? getting checked at Dr Talmage Nap  (3-11) 4. LBBB: This seems to be chronic. 5. Nonischemic cardiomyopathy: Echo (1/12) with diffuse hypokinesis and EF reported as 35-40% (looked like 30% at the most to me), grade I diastolic dysfunction, mild mitral regurgitation.  Left heart cath (3/12) with EF improved to 45-50%, no angiographic CAD.  Cardiac MRI 3/12 showed EF 43%, mild global hypokinesis, mild LV dilation, normal RV size and systolic function, no myocardial delayed enhancement. HIV negative, transferrin  saturation 34%, SPEP negative, ANA only weakly positive.  6. Syncope while walking on treadmill. 3 week event monitor in 2/12 showed no significant events.   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   Current Outpatient Prescriptions  Medication Sig Dispense Refill  . ALPRAZolam (XANAX) 1 MG tablet Take 1 mg by mouth at bedtime as needed. 0.5 mg bid      . aspirin 81 MG tablet Take 81 mg by mouth daily.        . diphenhydramine-acetaminophen (TYLENOL PM EXTRA STRENGTH) 25-500 MG TABS Take 1 tablet by mouth at bedtime as needed.        . risperiDONE (RISPERDAL) 1 MG tablet Take 1 mg by mouth daily. 2 tablets at bedtime      . carvedilol (COREG) 6.25 MG tablet Take 1 tablet (6.25 mg total) by mouth 2 (two) times daily.  60 tablet  11  . lisinopril (PRINIVIL,ZESTRIL) 5 MG tablet Take 1 tablet (5 mg total) by mouth daily.  30 tablet  11    BP 154/88  Pulse 73  Ht 5\' 2"  (1.575 m)  Wt 149 lb (67.586 kg)  BMI 27.25 kg/m2 General: NAD  Neck: No JVD, no thyromegaly or thyroid nodule.  Lungs: Clear to auscultation bilaterally with normal respiratory effort. CV: Nondisplaced PMI.  Heart regular S1/S2, no S3/S4, no murmur.  No peripheral edema.  No carotid bruit.  Normal pedal pulses.  Abdomen: Soft, nontender, no hepatosplenomegaly, no distention.  Skin: Intact without lesions or rashes.  Neurologic: Alert and oriented x 3.  Psych: Normal affect. Extremities: No clubbing or cyanosis.  HEENT: Normal.

## 2010-12-09 NOTE — Assessment & Plan Note (Signed)
Nonischemic cardiomyopathy.  EF was initially around 30%, improved now to 43% (by cardiac MRI) with medical treatment.  No angiographic coronary disease.  Labs tests for ANA, HIV, SPEP, transferrin saturation were all unremarkable.  No family history of cardiomyopathy.  No myocardial delayed enhancement, so no definite evidence for prior MI, myocarditis, or infiltrative disease.  - Increase Coreg to 6.25 mg bid and lisinopirl to 5 mg daily.   - BMET in 2 wks - Echo in 6 months

## 2011-01-01 ENCOUNTER — Other Ambulatory Visit (INDEPENDENT_AMBULATORY_CARE_PROVIDER_SITE_OTHER): Payer: BC Managed Care – PPO | Admitting: *Deleted

## 2011-01-01 DIAGNOSIS — I5022 Chronic systolic (congestive) heart failure: Secondary | ICD-10-CM

## 2011-01-01 LAB — BASIC METABOLIC PANEL
CO2: 27 mEq/L (ref 19–32)
Chloride: 102 mEq/L (ref 96–112)
Creatinine, Ser: 0.7 mg/dL (ref 0.4–1.2)
Potassium: 4.1 mEq/L (ref 3.5–5.1)
Sodium: 137 mEq/L (ref 135–145)

## 2011-01-13 NOTE — Assessment & Plan Note (Signed)
[facility]                        [place] OFFICE NOTE   NAME:Nancy Drake, Nancy Drake                   MRN:          [id number]  DATE:02/03/2007                            DOB:          February 04, 1961    REASON FOR VISIT:  Followup.   Nancy Drake presents today reporting that she feels much better.  She  noticed that her dizziness, resolved after she ate more frequent meals  throughout the day.  She has not had any significant recurrence since  she saw Dr. Drue Novel.  Review of Labs were unremarkable.  She has no specific  complaints today.   MEDICATIONS:  Please see med list.   OBJECTIVE:  Weight 137.4, temperature 98.9, pulse 78, respiratory rate  of 16, blood pressure 130/70.  She is a pleasant female in no acute  distress, answers questions appropriately, alert and oriented x3.  HEENT:  Unremarkable.  NECK:  Supple.  LUNGS:  Clear.  HEART:  Regular rate and rhythm.  No murmurs, gallops, rubs.   IMPRESSION:  Dizziness improved with diet changes, consistent with  hyperglycemia.   PLAN:  1. Advised patient to continue with lifestyle changes.  2. Encourage patient to follow up with her psychiatrist; as she has      not seen them in a while.  3. The patient to follow up as needed in the interim.     Leanne Chang, M.D.  Electronically Signed    LA/MedQ  DD: 02/03/2007  DT: 02/03/2007  Job #: [id number]

## 2011-02-18 ENCOUNTER — Telehealth: Payer: Self-pay | Admitting: Cardiology

## 2011-02-18 NOTE — Telephone Encounter (Signed)
That should be ok to use.

## 2011-02-18 NOTE — Telephone Encounter (Signed)
Pt aware OK to use per Dr Shirlee Latch

## 2011-02-18 NOTE — Telephone Encounter (Signed)
Pt is prescribe prometrium 200 mg from GYN. Wants to know should she take this.

## 2011-02-18 NOTE — Telephone Encounter (Signed)
Spoke with pt who states she is only to be on the medication for 12 days but wants to make sure it is OK with Dr Shirlee Latch.  Pt aware he is not in the office today but will be tomorrow and we will call her back once approval has been given.

## 2011-04-23 ENCOUNTER — Ambulatory Visit (INDEPENDENT_AMBULATORY_CARE_PROVIDER_SITE_OTHER): Payer: BC Managed Care – PPO | Admitting: Cardiology

## 2011-04-23 ENCOUNTER — Encounter: Payer: Self-pay | Admitting: Cardiology

## 2011-04-23 VITALS — BP 118/80 | HR 62 | Ht 62.0 in | Wt 137.8 lb

## 2011-04-23 DIAGNOSIS — I5022 Chronic systolic (congestive) heart failure: Secondary | ICD-10-CM

## 2011-04-23 MED ORDER — LISINOPRIL 5 MG PO TABS: ORAL_TABLET | ORAL | Status: DC

## 2011-04-23 NOTE — Patient Instructions (Signed)
Increase Lisinopril to 5mg  twice a day.  Your physician recommends that you return for lab work in: 2 weeks --BMP 428.22  Your physician has requested that you have an echocardiogram. Echocardiography is a painless test that uses sound waves to create images of your heart. It provides your doctor with information about the size and shape of your heart and how well your heart's chambers and valves are working. This procedure takes approximately one hour. There are no restrictions for this procedure.   September 2012.  Your physician recommends that you schedule a follow-up appointment in with Dr Shirlee Latch in September after the echo has been done.

## 2011-04-24 NOTE — Assessment & Plan Note (Signed)
Nonischemic cardiomyopathy.  EF was initially around 30%, improved now to 43% (by cardiac MRI) with medical treatment.  No angiographic coronary disease.  Labs tests for ANA, HIV, SPEP, transferrin saturation were all unremarkable.  No family history of cardiomyopathy.  No heavy ETOH or drug abuse.  No myocardial delayed enhancement, so no definite evidence for prior MI, myocarditis, or infiltrative disease.  - Continue current dose of Coreg.   - Increase lisinopril to 5 mg bid.  - BMET in 2 weeks.  - Followup in 2 months with echo to reassess LV systolic function on medical treatment.

## 2011-04-24 NOTE — Progress Notes (Signed)
PCP: Dr. Drue Novel  50 yo with history of LBBB and schizophreniform disorder presented initially for evaluation of abnormal echocardiogram and syncopal episode. In 1/12 while walking on a treadmill, patient felt lightheaded and flushed and passed out.  She landed on her face.  She was unconscious only for a few seconds she thinks.  She has had no syncopal or presyncopal episodes prior or since then.  She does not get exertional dyspnea or chest pain.  She walks for exercise.  She does not smoke.  As part of the workup for her syncopal episode, she had an echocardiogram.  This showed diffuse global LV hypokinesis.  EF was reported as 35-40%.  It looked more like 30% at the most to me.  As mentioned above, she has no history of chest pain.  No recent URI-type symptoms.  No history of HTN.  No family history of cardiomyopathy. She had a LBBB on ECG.  This had been seen in the past as well.   Left heart cath in 3/12 showed EF improved somewhat to 45-50% and no angiographic coronary disease.  Cardiac MRI showed EF 43% with no delayed enhancement.  3 week event monitor showed no significant arrhythmias.   Patient has been doing well symptomatically.  She swims, bikes, and walks for exercise.  She is doing some weight training.  She feels less anxious in general.  No exertional dyspnea or chest pain.   ECG: NSR, LBBB  Labs (1/12): K 3.9, creatinine 0.8 Labs (2/12): K 4.4, creatinine 0.7, BNP 29, SPEP negative, ANA weakly positive (1:40), HIV negative, transferrin saturation 34%.  Labs (5/12): K 4.1, creatinine 0.7  Allergies (verified):  1)  ! * Clarinex  Past Medical History: 1. Anxiety 2. 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 thyroid dz? getting checked at Dr Talmage Nap  (3-11) 4. LBBB: This seems to be chronic. 5. Nonischemic cardiomyopathy: Echo (1/12) with diffuse hypokinesis and EF reported as 35-40% (looked like 30% at the most to me), grade I diastolic dysfunction, mild mitral regurgitation.  Left  heart cath (3/12) with EF improved to 45-50%, no angiographic CAD.  Cardiac MRI 3/12 showed EF 43%, mild global hypokinesis, mild LV dilation, normal RV size and systolic function, no myocardial delayed enhancement. HIV negative, transferrin saturation 34%, SPEP negative, ANA only weakly positive.  No heavy ETOH use.  6. Syncope while walking on treadmill. 3 week event monitor in 2/12 showed no significant events.   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   Current Outpatient Prescriptions  Medication Sig Dispense Refill  . ALPRAZolam (XANAX) 1 MG tablet Take 1 mg by mouth at bedtime as needed. 0.5 mg bid      . aspirin 81 MG tablet Take 81 mg by mouth daily.        . carvedilol (COREG) 6.25 MG tablet Take 1 tablet (6.25 mg total) by mouth 2 (two) times daily.  60 tablet  11  . Melatonin 1 MG TABS Take 1 tablet by mouth as needed.        . risperiDONE (RISPERDAL) 1 MG tablet Take 1 mg by mouth daily. 2 tablets at bedtime      . lisinopril (  PRINIVIL,ZESTRIL) 5 MG tablet Take one tablet twice a day  60 tablet  6    BP 118/80  Pulse 62  Ht 5\' 2"  (1.575 m)  Wt 137 lb 12.8 oz (62.506 kg)  BMI 25.20 kg/m2 General: NAD Neck: No JVD, no thyromegaly or thyroid nodule.  Lungs: Clear to auscultation bilaterally with normal respiratory effort. CV: Nondisplaced PMI.  Heart regular S1/S2, no S3/S4, no murmur.  No peripheral edema.  No carotid bruit.  Normal pedal pulses.  Abdomen: Soft, nontender, no hepatosplenomegaly, no distention.  Skin: Intact without lesions or rashes.  Neurologic: Alert and oriented x 3.  Psych: Normal affect. Extremities: No clubbing or cyanosis.  HEENT: Normal.

## 2011-05-05 ENCOUNTER — Other Ambulatory Visit (INDEPENDENT_AMBULATORY_CARE_PROVIDER_SITE_OTHER): Payer: BC Managed Care – PPO | Admitting: *Deleted

## 2011-05-05 DIAGNOSIS — I5022 Chronic systolic (congestive) heart failure: Secondary | ICD-10-CM

## 2011-05-05 LAB — BASIC METABOLIC PANEL
BUN: 12 mg/dL (ref 6–23)
Chloride: 106 mEq/L (ref 96–112)
GFR: 91.56 mL/min (ref >60.00)
Glucose, Bld: 96 mg/dL (ref 70–99)
Potassium: 3.9 mEq/L (ref 3.5–5.1)
Sodium: 141 mEq/L (ref 135–145)

## 2011-05-12 ENCOUNTER — Encounter: Payer: Self-pay | Admitting: *Deleted

## 2011-05-15 ENCOUNTER — Encounter: Payer: Self-pay | Admitting: Family Medicine

## 2011-05-15 ENCOUNTER — Ambulatory Visit (INDEPENDENT_AMBULATORY_CARE_PROVIDER_SITE_OTHER): Payer: BC Managed Care – PPO | Admitting: Family Medicine

## 2011-05-15 VITALS — BP 130/74 | HR 65 | Temp 99.2°F | Wt 140.4 lb

## 2011-05-15 DIAGNOSIS — J40 Bronchitis, not specified as acute or chronic: Secondary | ICD-10-CM

## 2011-05-15 MED ORDER — GUAIFENESIN-CODEINE 100-10 MG/5ML PO SYRP: ORAL_SOLUTION | ORAL | Status: DC

## 2011-05-15 MED ORDER — CLARITHROMYCIN ER 500 MG PO TB24: 1000.0000 mg | ORAL_TABLET | Freq: Every day | ORAL | Status: AC

## 2011-05-15 NOTE — Patient Instructions (Signed)
Bronchitis Bronchitis is the body's way of reacting to injury and/or infection (inflammation) of the bronchi. Bronchi are the air tubes that extend from the windpipe into the lungs. If the inflammation becomes severe, it may cause shortness of breath.  CAUSES Inflammation may be caused by:  A virus.   Germs (bacteria).   Dust.   Allergens.   Pollutants and many other irritants.  The cells lining the bronchial tree are covered with tiny hairs (cilia). These constantly beat upward, away from the lungs, toward the mouth. This keeps the lungs free of pollutants. When these cells become too irritated and are unable to do their job, mucus begins to develop. This causes the characteristic cough of bronchitis. The cough clears the lungs when the cilia are unable to do their job. Without either of these protective mechanisms, the mucus would settle in the lungs. Then you would develop pneumonia. Smoking is a common cause of bronchitis and can contribute to pneumonia. Stopping this habit is the single most important thing you can do to help yourself. TREATMENT  Your caregiver may prescribe an antibiotic if the cough is caused by bacteria. Also, medicines that open up your airways make it easier to breathe. Your caregiver may also recommend or prescribe an expectorant. It will loosen the mucus to be coughed up. Only take over-the-counter or prescription medicines for pain, discomfort, or fever as directed by your caregiver.   Removing whatever causes the problem (smoking, for example) is critical to preventing the problem from getting worse.   Cough suppressants may be prescribed for relief of cough symptoms.   Inhaled medicines may be prescribed to help with symptoms now and to help prevent problems from returning.   For those with recurrent (chronic) bronchitis, there may be a need for steroid medicines.  SEEK IMMEDIATE MEDICAL CARE IF:  During treatment, you develop more pus-like mucus  (purulent sputum).   You or your child has an oral temperature above 100.4, not controlled by medicine.   Your baby is older than 3 months with a rectal temperature of 102 F (38.9 C) or higher.   Your baby is 3 months old or younger with a rectal temperature of 100.4 F (38 C) or higher.   You become progressively more ill.   You have increased difficulty breathing, wheezing, or shortness of breath.  It is necessary to seek immediate medical care if you are elderly or sick from any other disease. MAKE SURE YOU:  Understand these instructions.   Will watch your condition.   Will get help right away if you are not doing well or get worse.  Document Released: 08/17/2005 Document Re-Released: 11/11/2009 [employer] Patient Information 2011 [employer]. 

## 2011-05-15 NOTE — Progress Notes (Signed)
  Subjective:     Nancy Drake is a 50 y.o. female here for evaluation of a cough. Onset of symptoms was 5 days ago. Symptoms have been gradually worsening since that time. The cough is productive and is aggravated by reclining position. Associated symptoms include: fever and shortness of breath. Patient does not have a history of asthma. Patient does not have a history of environmental allergens. Patient has not traveled recently. Patient does not have a history of smoking. Patient has not had a previous chest x-ray. Patient has not had a PPD done.  The following portions of the patient's history were reviewed and updated as appropriate: allergies, current medications, past family history, past medical history, past social history, past surgical history and problem list.  Review of Systems Pertinent items are noted in HPI.    Objective:    Oxygen saturation 97% on room air BP 130/74  Pulse 65  Temp(Src) 99.2 F (37.3 C) (Oral)  Wt 140 lb 6.4 oz (63.685 kg)  SpO2 97% General appearance: alert, cooperative, appears stated age and no distress Ears: normal TM's and external ear canals both ears Nose: Nares normal. Septum midline. Mucosa normal. No drainage or sinus tenderness. Throat: lips, mucosa, and tongue normal; teeth and gums normal Neck: no adenopathy, no carotid bruit, no JVD, supple, symmetrical, trachea midline and thyroid not enlarged, symmetric, no tenderness/mass/nodules Lungs: rhonchi bilaterally Heart: regular rate and rhythm, S1, S2 normal, no murmur, click, rub or gallop Extremities: extremities normal, atraumatic, no cyanosis or edema    Assessment:    Acute Bronchitis    Plan:    Antibiotics per medication orders. Antitussives per medication orders. Avoid exposure to tobacco smoke and fumes. Call if shortness of breath worsens, blood in sputum, change in character of cough, development of fever or chills, inability to maintain nutrition and hydration. Avoid  exposure to tobacco smoke and fumes.

## 2011-05-21 ENCOUNTER — Other Ambulatory Visit (HOSPITAL_COMMUNITY): Payer: BC Managed Care – PPO

## 2011-05-21 ENCOUNTER — Ambulatory Visit: Payer: BC Managed Care – PPO | Admitting: Cardiology

## 2011-06-10 ENCOUNTER — Encounter: Payer: Self-pay | Admitting: Internal Medicine

## 2011-06-10 ENCOUNTER — Ambulatory Visit (INDEPENDENT_AMBULATORY_CARE_PROVIDER_SITE_OTHER): Payer: BC Managed Care – PPO | Admitting: Family Medicine

## 2011-06-10 ENCOUNTER — Encounter: Payer: Self-pay | Admitting: Family Medicine

## 2011-06-10 DIAGNOSIS — R059 Cough, unspecified: Secondary | ICD-10-CM | POA: Insufficient documentation

## 2011-06-10 DIAGNOSIS — R05 Cough: Secondary | ICD-10-CM | POA: Insufficient documentation

## 2011-06-10 NOTE — Patient Instructions (Signed)
Your cough and symptoms are most likely due to allergies and post nasal drip Start Claritin or Zyrtec daily Use the Nasonex- 2 sprays each nostril- daily Drink plenty of fluids Add Mucinex to thin your congestion Call with any questions or concerns Hang in there!

## 2011-06-10 NOTE — Assessment & Plan Note (Signed)
Most likely due to PND.  No bacterial infxn on PE.  Start OTC antihistamine and nasal steroid.  Reviewed supportive care and red flags that should prompt return.  Pt expressed understanding and is in agreement w/ plan.

## 2011-06-10 NOTE — Progress Notes (Signed)
  Subjective:    Patient ID: Nancy Drake, female    DOB: 16-Dec-1960, 50 y.o.   MRN: [id number]  HPI ? Bronchitis- had bronchitis in September, took abx but cough and congestion has not improved.  Coughing all day.  Cough is productive of white sputum.  No fevers.  Denies nasal congestion/PND.  + sick contacts.   Review of Systems For ROS see HPI     Objective:   Physical Exam  Vitals reviewed. Constitutional: She appears well-developed and well-nourished. No distress.  HENT:  Head: Normocephalic and atraumatic.  Right Ear: Tympanic membrane normal.  Left Ear: Tympanic membrane normal.  Nose: Mucosal edema and rhinorrhea present. Right sinus exhibits no maxillary sinus tenderness and no frontal sinus tenderness. Left sinus exhibits no maxillary sinus tenderness and no frontal sinus tenderness.  Mouth/Throat: Mucous membranes are normal. Posterior oropharyngeal erythema (w/ PND) present.  Eyes: Conjunctivae and EOM are normal. Pupils are equal, round, and reactive to light.  Neck: Normal range of motion. Neck supple.  Cardiovascular: Normal rate, regular rhythm and normal heart sounds.   Pulmonary/Chest: Effort normal and breath sounds normal. No respiratory distress. She has no wheezes. She has no rales.  Lymphadenopathy:    She has no cervical adenopathy.          Assessment & Plan:

## 2011-06-17 ENCOUNTER — Encounter: Payer: Self-pay | Admitting: *Deleted

## 2011-06-24 ENCOUNTER — Ambulatory Visit (INDEPENDENT_AMBULATORY_CARE_PROVIDER_SITE_OTHER): Payer: BC Managed Care – PPO | Admitting: Cardiology

## 2011-06-24 ENCOUNTER — Ambulatory Visit (HOSPITAL_COMMUNITY): Payer: BC Managed Care – PPO | Attending: Cardiology

## 2011-06-24 ENCOUNTER — Encounter: Payer: Self-pay | Admitting: Cardiology

## 2011-06-24 DIAGNOSIS — I059 Rheumatic mitral valve disease, unspecified: Secondary | ICD-10-CM | POA: Insufficient documentation

## 2011-06-24 DIAGNOSIS — I5022 Chronic systolic (congestive) heart failure: Secondary | ICD-10-CM

## 2011-06-24 DIAGNOSIS — R9431 Abnormal electrocardiogram [ECG] [EKG]: Secondary | ICD-10-CM | POA: Insufficient documentation

## 2011-06-24 DIAGNOSIS — R55 Syncope and collapse: Secondary | ICD-10-CM

## 2011-06-24 DIAGNOSIS — I509 Heart failure, unspecified: Secondary | ICD-10-CM | POA: Insufficient documentation

## 2011-06-24 DIAGNOSIS — I079 Rheumatic tricuspid valve disease, unspecified: Secondary | ICD-10-CM | POA: Insufficient documentation

## 2011-06-24 MED ORDER — CARVEDILOL 12.5 MG PO TABS: 12.5000 mg | ORAL_TABLET | Freq: Two times a day (BID) | ORAL | Status: DC

## 2011-06-24 NOTE — Progress Notes (Signed)
PCP: Dr. Drue Novel  49 yo with history of LBBB and schizophreniform disorder presented initially for evaluation of abnormal echocardiogram and syncopal episode. In 1/12 while walking on a treadmill, patient felt lightheaded and flushed and passed out.  She landed on her face.  She was unconscious only for a few seconds she thinks.  She has had no syncopal or presyncopal episodes prior or since then.  She does not get exertional dyspnea or chest pain.  She walks for exercise.  She does not smoke.  As part of the workup for her syncopal episode, she had an echocardiogram.  This showed diffuse global LV hypokinesis.  EF was reported as 35-40%.  It looked more like 30% at the most to me.  As mentioned above, she has no history of chest pain.  No recent URI-type symptoms.  No history of HTN.  No family history of cardiomyopathy. She had a LBBB on ECG.  This had been seen in the past as well.   Left heart cath in 3/12 showed EF improved somewhat to 45-50% and no angiographic coronary disease.  Cardiac MRI showed EF 43% with no delayed enhancement.  3 week event monitor showed no significant arrhythmias.   Patient continues to do well symptomatically.  She swims, bikes, and walks for exercise.  She is doing some weight training.  No exertional dyspnea or chest pain.  No further syncope.  Echo was done today.  I reviewed the echo, which showed EF around 40%, mild MR, grade II diastolic dysfunction.    ECG: NSR, LBBB  Labs (1/12): K 3.9, creatinine 0.8 Labs (2/12): K 4.4, creatinine 0.7, BNP 29, SPEP negative, ANA weakly positive (1:40), HIV negative, transferrin saturation 34%.  Labs (5/12): K 4.1, creatinine 0.7 Labs (9/12): K 3.9, creatinine 0.7  Allergies (verified):  1)  ! * Clarinex  Past Medical History: 1. Anxiety 2. 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 thyroid dz? getting checked at Dr Talmage Nap  (3-11) 4. LBBB: This seems to be chronic. 5. Nonischemic cardiomyopathy: Echo (1/12) with diffuse  hypokinesis and EF reported as 35-40% (looked like 30% at the most to me), grade I diastolic dysfunction, mild mitral regurgitation.  Left heart cath (3/12) with EF improved to 45-50%, no angiographic CAD.  Cardiac MRI 3/12 showed EF 43%, mild global hypokinesis, mild LV dilation, normal RV size and systolic function, no myocardial delayed enhancement. HIV negative, transferrin saturation 34%, SPEP negative, ANA only weakly positive.  No heavy ETOH use.  Echo (10/12): EF 40%, mild MR, grade II diastolic dysfunction (my read).  6. Syncope while walking on treadmill. 3 week event monitor in 2/12 showed no significant events.   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   Current Outpatient Prescriptions  Medication Sig Dispense Refill  . ALPRAZolam (XANAX) 1 MG tablet Take 1 mg by mouth at bedtime as needed. 0.5 mg bid      . aspirin 81 MG tablet Take 81 mg by mouth daily.        . diphenhydramine-acetaminophen (TYLENOL PM) 25-500 MG TABS Take 1 tablet by mouth at bedtime as needed.        Marland Kitchen guaiFENesin-codeine (ROBITUSSIN AC) 100-10  MG/5ML syrup 1-2 tsp po qhs prn cough  180 mL  0  . lisinopril (PRINIVIL,ZESTRIL) 5 MG tablet Take one tablet twice a day  60 tablet  6  . Melatonin 1 MG TABS Take 1 tablet by mouth as needed.        . risperiDONE (RISPERDAL) 1 MG tablet Take 5 mg by mouth daily. 2 tablets at bedtime      . DISCONTD: carvedilol (COREG) 6.25 MG tablet Take 1 tablet (6.25 mg total) by mouth 2 (two) times daily.  60 tablet  11  . carvedilol (COREG) 12.5 MG tablet Take 1 tablet (12.5 mg total) by mouth 2 (two) times daily.  60 tablet  6    BP 122/68  Pulse 72  Ht 5\' 1"  (1.549 m)  Wt  144 lb (65.318 kg)  BMI 27.21 kg/m2 General: NAD Neck: No JVD, no thyromegaly or thyroid nodule.  Lungs: Clear to auscultation bilaterally with normal respiratory effort. CV: Nondisplaced PMI.  Heart regular S1/S2, no S3/S4, no murmur.  No peripheral edema.  No carotid bruit.  Normal pedal pulses.  Abdomen: Soft, nontender, no hepatosplenomegaly, no distention.  Neurologic: Alert and oriented x 3.  Psych: Normal affect. Extremities: No clubbing or cyanosis.

## 2011-06-24 NOTE — Assessment & Plan Note (Signed)
Nonischemic cardiomyopathy.  EF was initially around 30%, improved to 43% on cardiac MRI.  Echo today appeared to show EF around 40%.  No angiographic coronary disease.  Labs tests for ANA, HIV, SPEP, transferrin saturation have been unremarkable.  No family history of cardiomyopathy.  No heavy ETOH or drug abuse.  No myocardial delayed enhancement, so no definite evidence for prior MI, myocarditis, or infiltrative disease.  It is possible that cardiomyopathy is related to a prior viral myocarditis.  - Increase Coreg to 9.375 mg bid x 5 days, then up to 12.5 mg bid after that..   - Continue current lisinopril  - Followup in 4 months.

## 2011-06-24 NOTE — Patient Instructions (Addendum)
Increase Coreg(carvedilol) to 9.375mg  twice a day for 5 days then increase to 12.5mg  twice a day. This will be one and one-half 6.25mg  tablets twice a day for 5 days then one 12.5mg  tablet twice a day.  Your physician wants you to follow-up in: 4 months with Dr Shirlee Latch. ( April 2013).  You will receive a reminder letter in the mail two months in advance. If you don't receive a letter, please call our office to schedule the follow-up appointment.

## 2011-06-24 NOTE — Assessment & Plan Note (Signed)
Three week event monitor earlier this year showed no worrisome arrhythmia.  She has not had any further syncopal or severe lightheaded events.  Will follow carefully.

## 2011-08-26 ENCOUNTER — Telehealth: Payer: Self-pay | Admitting: Internal Medicine

## 2011-08-26 NOTE — Telephone Encounter (Signed)
Patient states that she was supposed to fly to [place] this Friday 08/28/11 but cancelled the flight due to anxiety. She would like Dr. Drue Novel to write her a doctors note stating that she does have anxiety so that she can get a full refund for her ticket.

## 2011-08-27 NOTE — Telephone Encounter (Signed)
Patient made aware and agreed to call psych     KP

## 2011-08-27 NOTE — Telephone Encounter (Signed)
She sees psychiatry, they need to send the letter

## 2011-12-31 ENCOUNTER — Other Ambulatory Visit: Payer: Self-pay | Admitting: *Deleted

## 2011-12-31 DIAGNOSIS — I5022 Chronic systolic (congestive) heart failure: Secondary | ICD-10-CM

## 2011-12-31 MED ORDER — LISINOPRIL 5 MG PO TABS: ORAL_TABLET | ORAL | Status: DC

## 2012-02-15 ENCOUNTER — Other Ambulatory Visit: Payer: Self-pay | Admitting: Cardiology

## 2012-02-15 ENCOUNTER — Telehealth: Payer: Self-pay | Admitting: Cardiology

## 2012-02-15 DIAGNOSIS — F411 Generalized anxiety disorder: Secondary | ICD-10-CM | POA: Insufficient documentation

## 2012-02-15 DIAGNOSIS — R002 Palpitations: Secondary | ICD-10-CM | POA: Insufficient documentation

## 2012-02-15 NOTE — Telephone Encounter (Signed)
Pt calling stating she needs to get off some of her meds--states her psychologist recently changed her meds and she is having trouble taking all these meds and her psychiatric meds are causing her to have nightmares--she is almost out of coreg and wonders if she needs to take--advised to take her cardiac meds and if she is having problems with psychiatric meds, she needs to call psycologist who prescribed--i refilled her coreg and told her to go get it and continue to take it--pt reluctantly agrees

## 2012-02-15 NOTE — ED Notes (Signed)
Pt. initially came in c/o rapid heart rate. Checked vitals and heart rate was 67 with oxygen at 97% on RA.

## 2012-02-15 NOTE — Telephone Encounter (Signed)
Please return call to patient at [phone] regarding medications

## 2012-02-15 NOTE — Telephone Encounter (Signed)
Problems likely from psych meds, she needs to followup with psych.

## 2012-02-16 ENCOUNTER — Encounter (HOSPITAL_COMMUNITY): Payer: Self-pay | Admitting: *Deleted

## 2012-02-16 ENCOUNTER — Emergency Department (HOSPITAL_COMMUNITY)
Admission: EM | Admit: 2012-02-16 | Discharge: 2012-02-16 | Discharge status: Against Medical Advice | Payer: BC Managed Care – PPO | Attending: Emergency Medicine | Admitting: Emergency Medicine

## 2012-02-16 NOTE — ED Notes (Signed)
Pt reports anxiety today, was having palpitations earlier today d/t anxiety. Pt has taken xanax today x3 w/o relief. Pt also admits to poor sleep x5 days when her psychiatrist started her on her restoril. Pt denies SI/HI however states that the medications makes her feel paranoid "like something bad is going to happen to me."

## 2012-02-17 ENCOUNTER — Emergency Department (HOSPITAL_COMMUNITY)
Admission: EM | Admit: 2012-02-17 | Discharge: 2012-02-17 | Disposition: A | Discharge status: Home / Self Care | Payer: BC Managed Care – PPO | Attending: Emergency Medicine | Admitting: Emergency Medicine

## 2012-02-17 ENCOUNTER — Encounter (HOSPITAL_COMMUNITY): Payer: Self-pay | Admitting: Emergency Medicine

## 2012-02-17 ENCOUNTER — Ambulatory Visit (INDEPENDENT_AMBULATORY_CARE_PROVIDER_SITE_OTHER): Payer: Medicaid Other | Admitting: Internal Medicine

## 2012-02-17 VITALS — BP 128/86 | HR 93 | Temp 98.7°F | Wt 132.0 lb

## 2012-02-17 DIAGNOSIS — G4700 Insomnia, unspecified: Secondary | ICD-10-CM

## 2012-02-17 DIAGNOSIS — I428 Other cardiomyopathies: Secondary | ICD-10-CM

## 2012-02-17 DIAGNOSIS — F419 Anxiety disorder, unspecified: Secondary | ICD-10-CM

## 2012-02-17 DIAGNOSIS — F411 Generalized anxiety disorder: Secondary | ICD-10-CM | POA: Insufficient documentation

## 2012-02-17 DIAGNOSIS — Z79899 Other long term (current) drug therapy: Secondary | ICD-10-CM | POA: Insufficient documentation

## 2012-02-17 DIAGNOSIS — F2081 Schizophreniform disorder: Secondary | ICD-10-CM | POA: Insufficient documentation

## 2012-02-17 DIAGNOSIS — R410 Disorientation, unspecified: Secondary | ICD-10-CM

## 2012-02-17 DIAGNOSIS — F29 Unspecified psychosis not due to a substance or known physiological condition: Secondary | ICD-10-CM

## 2012-02-17 DIAGNOSIS — I429 Cardiomyopathy, unspecified: Secondary | ICD-10-CM

## 2012-02-17 LAB — RAPID URINE DRUG SCREEN, HOSP PERFORMED
Amphetamines: NOT DETECTED
Benzodiazepines: POSITIVE — AB
Cocaine: NOT DETECTED

## 2012-02-17 LAB — BASIC METABOLIC PANEL
BUN: 11 mg/dL (ref 6–23)
CO2: 27 mEq/L (ref 19–32)
Chloride: 102 mEq/L (ref 96–112)
Creatinine, Ser: 0.71 mg/dL (ref 0.50–1.10)
Glucose, Bld: 119 mg/dL — ABNORMAL HIGH (ref 70–99)

## 2012-02-17 LAB — DIFFERENTIAL
Lymphocytes Relative: 32 % (ref 12–46)
Lymphs Abs: 3.2 10*3/uL (ref 0.7–4.0)
Monocytes Absolute: 1 10*3/uL (ref 0.1–1.0)
Monocytes Relative: 10 % (ref 3–12)
Neutro Abs: 5.7 10*3/uL (ref 1.7–7.7)

## 2012-02-17 LAB — URINALYSIS, ROUTINE W REFLEX MICROSCOPIC
Bilirubin Urine: NEGATIVE
Glucose, UA: NEGATIVE mg/dL
Nitrite: NEGATIVE
Specific Gravity, Urine: 1.01 (ref 1.005–1.030)
pH: 6.5 (ref 5.0–8.0)

## 2012-02-17 LAB — URINE MICROSCOPIC-ADD ON

## 2012-02-17 LAB — CBC
HCT: 39.4 % (ref 36.0–46.0)
Hemoglobin: 13.7 g/dL (ref 12.0–15.0)
MCV: 87.4 fL (ref 78.0–100.0)
WBC: 9.9 10*3/uL (ref 4.0–10.5)

## 2012-02-17 MED ORDER — LORAZEPAM 1 MG PO TABS: 1.0000 mg | ORAL_TABLET | Freq: Three times a day (TID) | ORAL | Status: DC | PRN

## 2012-02-17 MED ORDER — CEPHALEXIN 500 MG PO CAPS
500.0000 mg | ORAL_CAPSULE | Freq: Four times a day (QID) | ORAL | Status: DC
  Administered 2012-02-17: 500 mg via ORAL
  Filled 2012-02-17: qty 1

## 2012-02-17 MED ORDER — ACETAMINOPHEN 325 MG PO TABS
650.0000 mg | ORAL_TABLET | ORAL | Status: DC | PRN
  Administered 2012-02-17: 650 mg via ORAL
  Filled 2012-02-17: qty 2

## 2012-02-17 MED ORDER — IBUPROFEN 200 MG PO TABS: 600.0000 mg | ORAL_TABLET | Freq: Three times a day (TID) | ORAL | Status: DC | PRN

## 2012-02-17 MED ORDER — ONDANSETRON HCL 4 MG PO TABS: 4.0000 mg | ORAL_TABLET | Freq: Three times a day (TID) | ORAL | Status: DC | PRN

## 2012-02-17 MED ORDER — ZOLPIDEM TARTRATE 5 MG PO TABS
5.0000 mg | ORAL_TABLET | Freq: Once | ORAL | Status: AC
  Administered 2012-02-17: 5 mg via ORAL

## 2012-02-17 MED ORDER — ZOLPIDEM TARTRATE 5 MG PO TABS
5.0000 mg | ORAL_TABLET | Freq: Every evening | ORAL | Status: DC | PRN
  Filled 2012-02-17: qty 1

## 2012-02-17 MED ORDER — LORAZEPAM 1 MG PO TABS
1.0000 mg | ORAL_TABLET | Freq: Once | ORAL | Status: AC
  Administered 2012-02-17: 1 mg via ORAL
  Filled 2012-02-17: qty 1

## 2012-02-17 NOTE — ED Notes (Signed)
Per EMS report, pt was sent to ED by Physician who was concerned that pt was misusing her medication.  Pt told MD that some days she doesn't take any of her tricyclic antidepressant and some days she takes more than she should.  Pt denies SI or intentional OD, states some days she is just more anxious than others.  Pt appears very anxious at this time.

## 2012-02-17 NOTE — ED Notes (Signed)
ACT team at bedside for assessment. 

## 2012-02-17 NOTE — ED Notes (Signed)
Placed on a tricyclic a week ago. States that she has been taking extra doses of the med and is more confused. Hx of cardiomyopathy

## 2012-02-17 NOTE — ED Notes (Signed)
ZOX:[id number]<AV> Expected date:02/17/12<BR> Expected time:<BR> Means of arrival:<BR> Comments:<BR> EMS 10 GC = od on tricyclic/stable

## 2012-02-17 NOTE — Discharge Instructions (Signed)
Anxiety and Panic Attacks Your caregiver has informed you that you are having an anxiety or panic attack. There may be many forms of this. Most of the time these attacks come suddenly and without warning. They come at any time of day, including periods of sleep, and at any time of life. They may be strong and unexplained. Although panic attacks are very scary, they are physically harmless. Sometimes the cause of your anxiety is not known. Anxiety is a protective mechanism of the body in its fight or flight mechanism. Most of these perceived danger situations are actually nonphysical situations (such as anxiety over losing a job). CAUSES  The causes of an anxiety or panic attack are many. Panic attacks may occur in otherwise healthy people given a certain set of circumstances. There may be a genetic cause for panic attacks. Some medications may also have anxiety as a side effect. SYMPTOMS  Some of the most common feelings are:  Intense terror.   Dizziness, feeling faint.   Hot and cold flashes.   Fear of going crazy.   Feelings that nothing is real.   Sweating.   Shaking.   Chest pain or a fast heartbeat (palpitations).   Smothering, choking sensations.   Feelings of impending doom and that death is near.   Tingling of extremities, this may be from over-breathing.   Altered reality (derealization).   Being detached from yourself (depersonalization).  Several symptoms can be present to make up anxiety or panic attacks. DIAGNOSIS  The evaluation by your caregiver will depend on the type of symptoms you are experiencing. The diagnosis of anxiety or panic attack is made when no physical illness can be determined to be a cause of the symptoms. TREATMENT  Treatment to prevent anxiety and panic attacks may include:  Avoidance of circumstances that cause anxiety.   Reassurance and relaxation.   Regular exercise.   Relaxation therapies, such as yoga.   Psychotherapy with a  psychiatrist or therapist.   Avoidance of caffeine, alcohol and illegal drugs.   Prescribed medication.  SEEK IMMEDIATE MEDICAL CARE IF:   You experience panic attack symptoms that are different than your usual symptoms.   You have any worsening or concerning symptoms.  Document Released: 08/17/2005 Document Revised: 08/06/2011 Document Reviewed: 12/19/2009 [facility] Patient Information 2012 [place], [place].  RESOURCE GUIDE  Chronic Pain Problems: Contact [facility]  [phone] Patients need to be referred by their primary care doctor.  Insufficient Money for Medicine: Contact United Way:  call "211" or [facility] 212-553-2844.  No Primary Care Doctor: - Call [facility]  [phone] - can help you locate a primary care doctor that  accepts your insurance, provides certain services, etc. - Physician Referral [phone]  Agencies that provide inexpensive medical care: - [facility]  [phone] - [facility]  [phone] - [facility]  [phone] [facility]  [phone] - [facility]  [phone] [facility]  [phone]  Medicaid-accepting [facility] Providers: - [facility]- [address], [address]  [phone], Mon-Fri 9am-7pm, Sat 9am-1pm - [facility]- [address] [place], [address] [place]  [phone] - [facility]- [address] [place]  [phone] [facility]- [address]  [phone] - Renaye Rakers- [address] [place], [address], [phone]  Only accepts [place] patients after they have their name  applied  to their card  Self Pay (no insurance) in [facility]: - Sickle Cell Patients: Dr Willey Blade, [facility]  [address] [place], [phone] - [facility]- [address] [place]  [phone]       Patrcia Dolly [facility] [place]-  [address]       -     [facility]- see information above (Speak to [employer] if you do not have insurance)       -  [facility]- [address] [place], [phone]       -  [facility] [place]- [address],  [phone]       -  [facility]- [address], [phone]       -  Dr Julio Sicks-  [address], [place], [phone]       -  [facility]- [address] [phone]       -  [facility]- [address], [phone], also [address], [phone]       -    [facility]- [address] [place], [phone], 1st & 3rd Saturday   every month, 10am-1pm  1) Find a Doctor and Pay Out of Pocket Although you won't have to find out who is covered by your insurance plan, it is a good idea to ask around and get recommendations. You will then need to call the office and see if the doctor you have chosen will accept you as a new patient and what types of options they offer for patients who are self-pay. Some doctors offer discounts or will set up payment plans for their patients who do not have insurance, but you will need to ask so you aren't surprised when you get to your appointment.  2) Contact Your Local Health Department Not all health departments have doctors that can see patients for sick visits, but many do, so it is worth a call to see if yours does. If you don't know where your local health department is, you can check in your phone book. The CDC also has a tool to help you locate your state's health department, and many state websites also have listings of all of their local health departments.  3) Find a Walk-in Clinic If your illness is not likely to be very severe or complicated, you may want to try a walk in clinic. These are popping up all over the country in pharmacies, drugstores, and shopping centers. They're usually staffed by nurse practitioners or physician assistants that have been trained  to treat common illnesses and complaints. They're usually fairly quick and inexpensive. However, if you have serious medical issues or chronic medical problems, these are probably not your best option  STD Testing - [facility] of [facility], STD Clinic, [address] [place], phone [phone] or [phone].  Monday - Friday, call for an appointment. [facility] Department of [employer], STD Clinic, [place] [address], [place], phone [phone] or [phone].  Monday - Friday, call for an appointment.  Abuse/Neglect: [facility] Child Abuse Hotline 218 419 6624 [facility] Child Abuse Hotline (703)368-8350 (After Hours)  Emergency Shelter:  [facility] (760)010-7434  Maternity Homes: - [facility] 913-537-9254 - [facility] [phone]  MRSA Hotline #:  161-0960  [facility] Resources  [facility] [place]  [employer] [facility] Dept. [address]                 [address]         [address] [place] [address]  Blondell Reveal Phone:  [phone]                                  Phone:  [phone]                   Phone:  [phone]  [facility] Mental Health, [phone] - [phone]       -     [facility] in [place], [address] [place] [address],                                  321-528-0858, [facility] Child Abuse Hotline 254-780-5739 or 416-044-6041 (After Hours)   Behavioral Health Services  Substance Abuse Resources: - [facility] and Drug Services  6677783222 - [facility] 9806636661 - [facility] [place] [phone] Floydene Flock [phone]) [phone] - Residential & Outpatient Substance Abuse Program   (939)536-9839  Psychological Services: [facility]  [phone] Services  [phone] - [facility], [phone] [place]. [address] [place], ACCESS LINE: 901-098-3894 or (810)629-4646, [employer]za  Dental Assistance  If unable to pay or uninsured, contact:  [facility] or [facility]. to become qualified for the adult dental clinic.  Patients with Medicaid: [facility] [phone] [address], [phone] [address] [phone]  If unable to pay, or uninsured, contact [facility] 510-301-9341) or [facility] Department [phone] in [place], [phone] in [facility]) to become qualified for the adult dental clinic  Other Low-Cost Community Dental Services: - [facility]- [address] [place], [place], [place], [address], [phone], [address], 2nd and 4th Thursday of the month at 6:30am.  10 clients each day by appointment, can sometimes see walk-in patients if someone does not show for an appointment. [facility]- [address] [place], [place], [address], [phone] - [facility] - [facility]- [address] [place], [place], [address], [phone] - [facility]- [phone] [facility] Department- [phone] [facility] Department- [phone]

## 2012-02-17 NOTE — BH Assessment (Signed)
Assessment Note   Nancy Drake is a 51 y.o. female who presents to [facility] with severe anxiety, insomnia and depression.  Pt denies SI/HI/Psych.  Pt came to ed on Monday with same sxs and d/c'd home with followup.  Pt reports the following: pt has been experiencing increased anxiety for the past 6 mos and states that she's had a change in her medications.  Pt.'s risperidal was decreased to 0.5mg  by Dr. [place] Pink).  Pt is very restless and has severe anxiety during assessment, is confused and concentration is decreased.  Pt can contract for safety and has appointment with psych 02/18/12.  Pt will be d/c home with follow up instructions and telepsych will be cancelled.    Axis I: Anxiety Disorder NOS and Depressive Disorder NOS Axis II: Deferred Axis III:  Past Medical History  Diagnosis Date  . Anxiety   . 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 Cardiomyopathy   . Syncope     while walking on treadmill  . LBBB (left bundle branch block)     SEEMS TO BE CHRONIC   Axis IV: other psychosocial or environmental problems Axis V: 51-60 moderate symptoms  Past Medical History:  Past Medical History  Diagnosis Date  . Anxiety   . 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 Cardiomyopathy   . Syncope     while walking on treadmill  . LBBB (left bundle branch block)     SEEMS TO BE CHRONIC    Past Surgical History  Procedure Date  . Cesarean section     Family History:  Family History  Problem Relation Age of Onset  . Other Other     uncle with pacemaker  . Heart disease Other     PACERMAKER    Social History:  reports that she has never smoked. She has never used smokeless tobacco. She reports that she drinks alcohol. She reports that she does not use illicit drugs.  Additional Social History:  Alcohol / Drug Use Pain Medications: None  Prescriptions: None  Over the Counter: None  History of alcohol / drug use?: No history of alcohol / drug abuse Longest  period of sobriety (when/how long): None   CIWA: CIWA-Ar BP: 133/79 mmHg Pulse Rate: 92  COWS:    Allergies:  Allergies  Allergen Reactions  . Desloratadine     Unknown reaction.     Home Medications:  (Not in a hospital admission)  OB/GYN Status:  No LMP recorded. Patient is not currently having periods (Reason: Perimenopausal).  General Assessment Data Location of Assessment: [facility] ED Living Arrangements: Children;Parent Can pt return to current living arrangement?: Yes Admission Status: Voluntary Is patient capable of signing voluntary admission?: Yes Transfer from: Acute Hospital Referral Source: MD  Education Status Is patient currently in school?: No Current Grade: None  Highest grade of school patient has completed: None  Name of school: None  Contact person: None   Risk to self Suicidal Ideation: No Is patient at risk for suicide?: No Suicidal Plan?: No Access to Means: No Specify Access to Suicidal Means: None  What has been your use of drugs/alcohol within the last 12 months?: Pt denies  Previous Attempts/Gestures: No How many times?: 0  Other Self Harm Risks: None  Triggers for Past Attempts: None known Intentional Self Injurious Behavior: None Family Suicide History: No Recent stressful life event(s): Other (Comment) (Unk; Recent change in psych meds, Relational issues ) Persecutory voices/beliefs?: No Depression: Yes Depression  Symptoms: Loss of interest in usual pleasures Substance abuse history and/or treatment for substance abuse?: No Suicide prevention information given to non-admitted patients: Not applicable  Risk to Others Homicidal Ideation: No Thoughts of Harm to Others: No Current Homicidal Intent: No Current Homicidal Plan: No Access to Homicidal Means: No Identified Victim: None  History of harm to others?: No Assessment of Violence: None Noted Violent Behavior Description: None  Does patient have access to weapons?: No Criminal  Charges Pending?: No Does patient have a court date: No  Psychosis Hallucinations: None noted Delusions: None noted  Mental Status Report Appear/Hygiene: Other (Comment) (Appropriate ) Eye Contact: Fair Motor Activity: Unremarkable Speech: Logical/coherent Level of Consciousness: Alert;Restless Mood: Anxious;Depressed Affect: Anxious;Frightened Anxiety Level: Severe Thought Processes: Coherent;Relevant Judgement: Unimpaired Orientation: Person;Place;Time;Situation Obsessive Compulsive Thoughts/Behaviors: None  Cognitive Functioning Concentration: Decreased Memory: Recent Intact;Remote Intact IQ: Average Insight: Fair Impulse Control: Fair Appetite: Fair Weight Loss: 0  Weight Gain: 0  Sleep: Decreased Total Hours of Sleep: 3  Vegetative Symptoms: None  ADLScreening [facility] Assessment Services) Patient's cognitive ability adequate to safely complete daily activities?: Yes Patient able to express need for assistance with ADLs?: Yes Independently performs ADLs?: Yes  Abuse/Neglect [employer]) Physical Abuse: Denies Verbal Abuse: Denies Sexual Abuse: Denies  Prior Inpatient Therapy Prior Inpatient Therapy: No Prior Therapy Dates: None  Prior Therapy Facilty/Provider(s): None  Reason for Treatment: None   Prior Outpatient Therapy Prior Outpatient Therapy: No Prior Therapy Dates: None  Prior Therapy Facilty/Provider(s): None  Reason for Treatment: None   ADL Screening (condition at time of admission) Patient's cognitive ability adequate to safely complete daily activities?: Yes Patient able to express need for assistance with ADLs?: Yes Independently performs ADLs?: Yes Weakness of Legs: None Weakness of Arms/Hands: None  Home Assistive Devices/Equipment Home Assistive Devices/Equipment: None  Therapy Consults (therapy consults require a physician order) PT Evaluation Needed: No OT Evalulation Needed: No SLP Evaluation Needed: No Abuse/Neglect Assessment  (Assessment to be complete while patient is alone) Physical Abuse: Denies Verbal Abuse: Denies Sexual Abuse: Denies Exploitation of patient/patient's resources: Denies Self-Neglect: Denies Values / Beliefs Cultural Requests During Hospitalization: None Spiritual Requests During Hospitalization: None Consults Spiritual Care Consult Needed: No Social Work Consult Needed: No Merchant navy officer (For Healthcare) Advance Directive: Patient does not have advance directive;Patient would not like information Pre-existing out of facility DNR order (yellow form or pink MOST form): No Nutrition Screen Unintentional weight loss greater than 10lbs within the last month: No Problems chewing or swallowing foods and/or liquids: No Home Tube Feeding or Total Parenteral Nutrition (TPN): No Patient appears severely malnourished: No Pregnant or Lactating: No  Additional Information 1:1 In Past 12 Months?: No CIRT Risk: No Elopement Risk: No Does patient have medical clearance?: Yes     Disposition:  Disposition Disposition of Patient: Outpatient treatment;Referred to (Current Provider ) Type of outpatient treatment: Adult Patient referred to: Outpatient clinic referral  On Site Evaluation by:   Reviewed with Physician:     Murrell Redden 02/17/2012 9:48 PM

## 2012-02-17 NOTE — ED Notes (Signed)
D/c instructions reviewed w/ pt and family - pt and family deny any further questions or concerns at present. Pt ambulating independently w/ steady gait on d/c in no acute distress, A&Ox4.  

## 2012-02-17 NOTE — ED Notes (Signed)
Telepsych consult called and information faxed.  Caller informed that they have multiple consults and only 1 psychiatrist so it will probably take longer than 1 hour for consult to be completed.

## 2012-02-17 NOTE — Progress Notes (Signed)
  Subjective:    Patient ID: Nancy Drake, female    DOB: Sep 18, 1960, 51 y.o.   MRN: [id number]  HPI Acute visit. Here with her father. --The first complaint of the patient was sore throat for 3 days, she denied cough, runny nose or URI type of symptoms. Subjective fever?. --As I notice that she was extremely anxious she reported that she was really feeling poorly. Increased anxiety started about 2 weeks ago, she couldn't sleep, went to see psychiatry 02/01/2012 and she was prescribed doxepin according to the patient. The patient's father also reports that she is very anxious because her only living daughter is taking a trip with her father. --On further questions, she admits to palpitations on and off, some dizziness. Also some shortness of breath and a ill defined  anterior chest pain. She cannot describe symptoms further. + near fainting feeling sometimes. --I notice that the patient was having some difficulty expressing her words and the father states that she has been a little confused lately. --She showed me her all her medicine bottles, has several bottles of risperdol. I'm not sure about compliance    Past Medical History: 10-2010 syncope--->ECHO 10-2010, cath 10-2010 10-2010 dx with Dilated cardiomyopathy with initial  EF ~ 30% by echo Anxiety, lost a child , sees psych Schizophrenia not on BCP thyroid dz? getting checked at Dr Talmage Nap  (3-11)  Past Surgical History: c-section  Family History: CAD--no DM-- +  Social History: born in [place],  divorced , lives w/ father, 2 child, lost one  tobacco-- no ETOH-- socially   Review of Systems See HPI    Objective:   Physical Exam Alert, very anxious appearing, does not seem to be in any physical distress. Neck no JVD HEENT: Throat with minimal redness and normal uvula. No discharge. Lungs clear to auscultation bilaterally Cardiovascular, regular, no murmur Extremities no edema Neurological exam, alert, oriented x3,  definitely has some difficulty finding words, fidgeting constantly, no actual tremor. Face symmetric, EOMI, motor intact.     Assessment & Plan:  Presents today with several symptoms: Sore throat Severe anxiety, history of schizophrenia. Insomnia, recently started doxepin Difficulty finding words, fidgeting. Confused at times ? (per pt father's report) Etiology of the symptoms is unclear. Tricyclic overdose? Severe anxiety, uncontrolled schizophrenia? Stroke ? history of dilated cardiomyopathy. Patient refused I EKG, refused strep test. Plan: Referral to the ER for further eval. Father in agreement. Case discussed with the emergency room physician. JP

## 2012-02-17 NOTE — ED Provider Notes (Signed)
History     CSN: [id number]  Arrival date & time 02/17/12  1645   First MD Initiated Contact with Patient 02/17/12 1723      Chief Complaint  Patient presents with  . Anxiety    (Consider location/radiation/quality/duration/timing/severity/associated sxs/prior treatment) HPI Comments: C. placed on doxepin. His had increasing anxiety. Was seen by her primary care physician and sent here for further evaluation. She's had difficulty sleeping and concentrating. She is slightly confused. She has no suicidal or homicidal ideation   Patient is a 51 y.o. female presenting with anxiety. The history is provided by the patient. No language interpreter was used.  Anxiety This is a new problem. The current episode started more than 2 days ago (approx 1 week ago). The problem occurs constantly. The problem has been gradually worsening. Pertinent negatives include no chest pain, no abdominal pain, no headaches and no shortness of breath. Nothing aggravates the symptoms. Nothing relieves the symptoms. She has tried nothing for the symptoms. The treatment provided no relief.    Past Medical History  Diagnosis Date  . Anxiety   . 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 Cardiomyopathy   . Syncope     while walking on treadmill  . LBBB (left bundle branch block)     SEEMS TO BE CHRONIC    Past Surgical History  Procedure Date  . Cesarean section     Family History  Problem Relation Age of Onset  . Other Other     uncle with pacemaker  . Heart disease Other     PACERMAKER    History  Substance Use Topics  . Smoking status: Never Smoker   . Smokeless tobacco: Never Used  . Alcohol Use: 0.0 oz/week     socially    OB History    Grav Para Term Preterm Abortions TAB SAB Ect Mult Living                  Review of Systems  Constitutional: Negative for fever, chills, activity change, appetite change and fatigue.  HENT: Negative for congestion, sore throat, rhinorrhea, neck  pain and neck stiffness.   Respiratory: Negative for cough and shortness of breath.   Cardiovascular: Negative for chest pain and palpitations.  Gastrointestinal: Negative for nausea, vomiting, abdominal pain, diarrhea and constipation.  Genitourinary: Negative for dysuria, urgency, frequency and flank pain.  Musculoskeletal: Negative for myalgias, back pain and arthralgias.  Neurological: Negative for dizziness, weakness, light-headedness, numbness and headaches.  Psychiatric/Behavioral: Positive for confusion and dysphoric mood. Negative for suicidal ideas, hallucinations, self-injury and agitation. The patient is nervous/anxious.   All other systems reviewed and are negative.    Allergies  Desloratadine  Home Medications   Current Outpatient Rx  Name Route Sig Dispense Refill  . ALPRAZOLAM 1 MG PO TABS Oral Take 0.5-1 mg by mouth 4 (four) times daily as needed. For anxiety    . CARVEDILOL 12.5 MG PO TABS Oral Take 12.5 mg by mouth 2 (two) times daily with a meal.    . DOXEPIN HCL 25 MG PO CAPS Oral Take 25 mg by mouth at bedtime.    Marland Kitchen LISINOPRIL 5 MG PO TABS Oral Take 5 mg by mouth 2 (two) times daily.    . ADULT MULTIVITAMIN W/MINERALS CH Oral Take 1 tablet by mouth daily.    Marland Kitchen RISPERIDONE 0.5 MG PO TABS Oral Take 0.5 mg by mouth every evening.      BP 150/92  Pulse 90  Temp 98.7 F (37.1 C) (Oral)  Resp 18  Ht 5\' 2"  (1.575 m)  Wt 132 lb (59.875 kg)  BMI 24.14 kg/m2  SpO2 97%  Physical Exam  Nursing note and vitals reviewed. Constitutional: She is oriented to person, place, and time. She appears well-developed and well-nourished. No distress.  HENT:  Head: Normocephalic and atraumatic.  Mouth/Throat: Oropharynx is clear and moist. No oropharyngeal exudate.  Eyes: Conjunctivae and EOM are normal. Pupils are equal, round, and reactive to light.  Neck: Normal range of motion. Neck supple.  Cardiovascular: Normal rate, regular rhythm, normal heart sounds and intact distal  pulses.  Exam reveals no gallop and no friction rub.   No murmur heard. Pulmonary/Chest: Effort normal and breath sounds normal. No respiratory distress. She exhibits no tenderness.  Abdominal: Soft. Bowel sounds are normal. There is no tenderness. There is no rebound and no guarding.  Musculoskeletal: Normal range of motion. She exhibits no edema and no tenderness.  Neurological: She is alert and oriented to person, place, and time. She has normal strength. No cranial nerve deficit or sensory deficit.  Skin: Skin is warm. No rash noted.  Psychiatric: Her affect is blunt. Her speech is delayed. Her speech is not rapid and/or pressured. She is slowed and withdrawn. Thought content is not paranoid and not delusional. She exhibits a depressed mood. She expresses no homicidal and no suicidal ideation. She expresses no suicidal plans and no homicidal plans.    ED Course  Procedures (including critical care time)  Labs Reviewed  URINALYSIS, ROUTINE W REFLEX MICROSCOPIC - Abnormal; Notable for the following:    Leukocytes, UA SMALL (*)     All other components within normal limits  URINE RAPID DRUG SCREEN (HOSP PERFORMED) - Abnormal; Notable for the following:    Benzodiazepines POSITIVE (*)     All other components within normal limits  URINE MICROSCOPIC-ADD ON - Abnormal; Notable for the following:    Squamous Epithelial / LPF FEW (*)     Bacteria, UA FEW (*)     All other components within normal limits  BASIC METABOLIC PANEL - Abnormal; Notable for the following:    Glucose, Bld 119 (*)     All other components within normal limits  CBC  DIFFERENTIAL   No results found.   1. ANXIETY       MDM  Anxiety. Since likely secondary to recent adjustment of her medications. She has an appointment with her psychiatrist tomorrow. She received a dose of Ambien prior to discharge for sleep. She was received a dose of Ativan. The act team spoke with her provided additional recommendations.  There is no need for telemetry psych as the patient is not a harm to herself or others and has no evidence of psychosis. She is medically clear. Will be discharged home        Dayton Bailiff, MD 02/17/12 2112

## 2012-02-18 ENCOUNTER — Emergency Department (HOSPITAL_COMMUNITY)
Admission: EM | Admit: 2012-02-18 | Discharge: 2012-02-22 | Disposition: A | Discharge status: Home / Self Care | Payer: BC Managed Care – PPO | Attending: Emergency Medicine | Admitting: Emergency Medicine

## 2012-02-18 ENCOUNTER — Encounter: Payer: Self-pay | Admitting: Internal Medicine

## 2012-02-18 ENCOUNTER — Encounter (HOSPITAL_COMMUNITY): Payer: Self-pay | Admitting: *Deleted

## 2012-02-18 DIAGNOSIS — Z79899 Other long term (current) drug therapy: Secondary | ICD-10-CM | POA: Insufficient documentation

## 2012-02-18 DIAGNOSIS — F419 Anxiety disorder, unspecified: Secondary | ICD-10-CM

## 2012-02-18 DIAGNOSIS — F411 Generalized anxiety disorder: Secondary | ICD-10-CM | POA: Insufficient documentation

## 2012-02-18 DIAGNOSIS — F2081 Schizophreniform disorder: Secondary | ICD-10-CM | POA: Insufficient documentation

## 2012-02-18 DIAGNOSIS — I428 Other cardiomyopathies: Secondary | ICD-10-CM | POA: Insufficient documentation

## 2012-02-18 LAB — RAPID URINE DRUG SCREEN, HOSP PERFORMED
Amphetamines: NOT DETECTED
Barbiturates: NOT DETECTED
Cocaine: NOT DETECTED
Tetrahydrocannabinol: NOT DETECTED

## 2012-02-18 LAB — COMPREHENSIVE METABOLIC PANEL
ALT: 20 U/L (ref 0–35)
AST: 20 U/L (ref 0–37)
Albumin: 4.6 g/dL (ref 3.5–5.2)
CO2: 26 mEq/L (ref 19–32)
Chloride: 101 mEq/L (ref 96–112)
GFR calc non Af Amer: >90 mL/min (ref >90)
Potassium: 3.8 mEq/L (ref 3.5–5.1)
Sodium: 138 mEq/L (ref 135–145)
Total Bilirubin: 0.4 mg/dL (ref 0.3–1.2)

## 2012-02-18 LAB — CBC
Platelets: 370 10*3/uL (ref 150–400)
RBC: 4.7 MIL/uL (ref 3.87–5.11)
RDW: 13.1 % (ref 11.5–15.5)
WBC: 11.1 10*3/uL — ABNORMAL HIGH (ref 4.0–10.5)

## 2012-02-18 MED ORDER — RISPERIDONE 0.5 MG PO TABS
0.5000 mg | ORAL_TABLET | Freq: Every evening | ORAL | Status: DC
  Administered 2012-02-18 – 2012-02-21 (×4): 0.5 mg via ORAL
  Filled 2012-02-18 (×4): qty 1

## 2012-02-18 MED ORDER — LISINOPRIL 5 MG PO TABS
5.0000 mg | ORAL_TABLET | Freq: Two times a day (BID) | ORAL | Status: DC
  Administered 2012-02-18 – 2012-02-22 (×9): 5 mg via ORAL
  Filled 2012-02-18 (×12): qty 1

## 2012-02-18 MED ORDER — ALPRAZOLAM 0.5 MG PO TABS
0.5000 mg | ORAL_TABLET | Freq: Four times a day (QID) | ORAL | Status: DC | PRN
  Administered 2012-02-18: 1 mg via ORAL
  Administered 2012-02-18 – 2012-02-19 (×4): 0.5 mg via ORAL
  Administered 2012-02-20 – 2012-02-21 (×4): 1 mg via ORAL
  Filled 2012-02-18 (×3): qty 1
  Filled 2012-02-18 (×2): qty 2
  Filled 2012-02-18 (×2): qty 1
  Filled 2012-02-18 (×2): qty 2

## 2012-02-18 MED ORDER — CARVEDILOL 12.5 MG PO TABS
12.5000 mg | ORAL_TABLET | Freq: Two times a day (BID) | ORAL | Status: DC
  Administered 2012-02-18 – 2012-02-22 (×8): 12.5 mg via ORAL
  Filled 2012-02-18 (×11): qty 1

## 2012-02-18 MED ORDER — DOXEPIN HCL 25 MG PO CAPS
25.0000 mg | ORAL_CAPSULE | Freq: Every day | ORAL | Status: DC
  Administered 2012-02-18 – 2012-02-21 (×4): 25 mg via ORAL
  Filled 2012-02-18 (×5): qty 1

## 2012-02-18 NOTE — ED Notes (Signed)
Appetite fair.  

## 2012-02-18 NOTE — ED Provider Notes (Signed)
History     CSN: [id number]  Arrival date & time 02/18/12  1003   First MD Initiated Contact with Patient 02/18/12 1007      Chief Complaint  Patient presents with  . Medical Clearance    (Consider location/radiation/quality/duration/timing/severity/associated sxs/prior treatment) The history is provided by the patient and medical records (psychiatrist office). The history is limited by the condition of the patient (psychosis, unwillingness to cooperate with examination).  Pt is a 51 y/o F with PMH anxiety, schizophreniform disorder, stable cardiomyopathy who presents to ED escorted by police (without IVC paperwork) from her psychiatrist office for medical clearance and inpatient treatment of psychosis. Pt does tell me that her doctor saw her today, but will not elaborate. Reports SI at one point in our interview, then denies later. Endorses voices in her head, denies visual hallucinations. Appears confused. States "pain all over" but will not elaborate.   Past Medical History  Diagnosis Date  . Anxiety   . 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 Cardiomyopathy   . Syncope     while walking on treadmill  . LBBB (left bundle branch block)     SEEMS TO BE CHRONIC    Past Surgical History  Procedure Date  . Cesarean section     Family History  Problem Relation Age of Onset  . Other Other     uncle with pacemaker  . Heart disease Other     PACERMAKER    History  Substance Use Topics  . Smoking status: Never Smoker   . Smokeless tobacco: Never Used  . Alcohol Use: 0.0 oz/week     socially     Review of Systems  Unable to perform ROS: Psychiatric disorder    Allergies  Desloratadine  Home Medications   Current Outpatient Rx  Name Route Sig Dispense Refill  . ALPRAZOLAM 1 MG PO TABS Oral Take 0.5-1 mg by mouth 4 (four) times daily as needed. For anxiety    . CARVEDILOL 12.5 MG PO TABS Oral Take 12.5 mg by mouth 2 (two) times daily with a meal.    .  DOXEPIN HCL 25 MG PO CAPS Oral Take 25 mg by mouth at bedtime.    Marland Kitchen LISINOPRIL 5 MG PO TABS Oral Take 5 mg by mouth 2 (two) times daily.    . ADULT MULTIVITAMIN W/MINERALS CH Oral Take 1 tablet by mouth daily.    Marland Kitchen RISPERIDONE 0.5 MG PO TABS Oral Take 0.5 mg by mouth every evening.      BP 126/96  Pulse 88  Temp 98.9 F (37.2 C) (Oral)  Resp 20  SpO2 100%  Physical Exam  Constitutional: She appears well-developed and well-nourished.       Disheveled.   HENT:  Head: Normocephalic and atraumatic.  Right Ear: External ear normal.       MMM  Eyes: Conjunctivae are normal.  Neck: Neck supple.  Cardiovascular: Normal rate, regular rhythm and normal heart sounds.   Pulmonary/Chest: Effort normal and breath sounds normal. No respiratory distress. She has no wheezes.  Abdominal: Soft. Bowel sounds are normal. She exhibits no distension. There is no tenderness.  Musculoskeletal: She exhibits no edema.  Neurological: She is alert. No cranial nerve deficit (3-12 intact). Abnormal coordination: does not cooperate with coordination testing.  Skin: Skin is warm and dry.  Psychiatric: Her mood appears anxious. Her speech is delayed and tangential. She is withdrawn and actively hallucinating. Cognition and memory are impaired. She expresses inappropriate  judgment. She expresses suicidal ideation. She is inattentive.    ED Course  Procedures (including critical care time)  Labs Reviewed  CBC - Abnormal; Notable for the following:    WBC 11.1 (*)     All other components within normal limits  COMPREHENSIVE METABOLIC PANEL - Abnormal; Notable for the following:    Glucose, Bld 117 (*)     All other components within normal limits  URINE RAPID DRUG SCREEN (HOSP PERFORMED) - Abnormal; Notable for the following:    Benzodiazepines POSITIVE (*)     All other components within normal limits  ETHANOL  POCT PREGNANCY, URINE   No results found.     MDM  Psychosis, seen by psychiatrist with a  [facility] contract. Is already accepted at [facility] but no beds available. Medically cleared, awaiting placement.         Shaaron Adler, [place] 02/18/12 2026

## 2012-02-18 NOTE — ED Notes (Signed)
Positive for Benzo's but had Ativan prior to collecting specimen.

## 2012-02-18 NOTE — ED Notes (Signed)
Pt alert.  Pt denies SI/HI.  Pt c/o hearing numbers in her mind.  Pt calm and cooperative

## 2012-02-18 NOTE — ED Notes (Signed)
Pt received to Rm 35. Alert, asking for her father. Asking for "mango juice" and became tearful. When questioned said "my Mom always drank that". Given apple juice and a snack.

## 2012-02-18 NOTE — ED Notes (Addendum)
Pt's sister, Darl Pikes called. Wanted to make sure we knew pt had "heart condition". Also wanted to emphasize that her ex-husband "[name]" is not to make any decisions regarding her care. [name] is currently in visiting with pt and is expressing concerns about her current meds and her private psychiatrist. States "I have been arguing with him for years".

## 2012-02-18 NOTE — ED Notes (Signed)
Family at bedside. 

## 2012-02-18 NOTE — ED Notes (Signed)
Pt. Belonging in Locker #35 (1 bag)

## 2012-02-18 NOTE — ED Notes (Signed)
Pt is manic. Pt was escorted by gpd to ed. Pt was sent from pcp due to manic phases. Pt is standing difficult to redirect. Pt denies si/hi.pt states my head fills full and I dont know how to complete my thoughts

## 2012-02-18 NOTE — ED Notes (Signed)
Pt has been wanded and has one bag of belongings. Pt purse and cell phone sent with father.

## 2012-02-18 NOTE — Telephone Encounter (Signed)
LM for pt on voice ID voice mail that recent symptoms unlikely to be from coreg per Dr Shirlee Latch. Pt advised she should continue coreg and folllowup with provider that prescribed other medications.

## 2012-02-18 NOTE — ED Notes (Signed)
Pt's father has been at her bedside for a prolonged period of time. Pt seems very comforted by his presence.

## 2012-02-18 NOTE — ED Notes (Signed)
FAMILY CONTACTS  Myra Rude (brother-in-law)  [phone]  Janett Billow (sister) [phone]

## 2012-02-19 NOTE — Consult Note (Signed)
Reason for Consult: Anxiety, and psychosis, questionable, noncompliant with the medication management Referring Physician: Dr. Lenor Coffin is an 51 y.o. female.  HPI: Patient was seen and chart reviewed. Patient has no previous history of acute psychiatric hospitalization but received outpatient psychiatric services from Dr. Judie Bonus who provided Risperdal and doxepin. Patient was brought in by [facility] for displaying manic behaviors at her physician office and thought blocking. Patient has appeared talking herself, highly anxious picking on her fingernails constantly and telling herself to stop picking fingers. Reportedly patient has been mismanaging her medication taking too many pills or not taking it all. Patient seems to be obsessed and has compulsive behaviors. Patient was able to take a shower within reasonable time during this morning. Patient was brought in by her father 2 times in the last 2 weeks because her sleep difficulties, nightmares, not him, not taking medication right. Patient was sent home because of no safety concerns referred to the outpatient psychiatric services. Patient blamed her increased anxiety for repeated change of her psychotropic medications. Patient stated she is not eating right or sleeping well and asking if she had took enough meals for the day and asking if she can be safe enough to go home. Patient has been decompensating since last the emergency department visit about 2 days ago.  Patient appeared staying in her room sitting on the edge of the bed and took 1/2 of her meal provided. She is awake, alert, oriented. Patient has anxious mood with the appropriate and congruent affect. Patient was unable to keep her hands still constantly picking her fingers or toes, which seems to be more stressful to her. Patient has denied suicidal and homicidal ideations, intentions and plans. Patient has no evidence of paranoia, delusional, auditory or  visual hallucinations.   Past Medical History  Diagnosis Date  . Anxiety   . 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 Cardiomyopathy   . Syncope     while walking on treadmill  . LBBB (left bundle branch block)     SEEMS TO BE CHRONIC    Past Surgical History  Procedure Date  . Cesarean section     Family History  Problem Relation Age of Onset  . Other Other     uncle with pacemaker  . Heart disease Other     PACERMAKER    Social History:  reports that she has never smoked. She has never used smokeless tobacco. She reports that she drinks alcohol. She reports that she does not use illicit drugs.  Allergies:  Allergies  Allergen Reactions  . Desloratadine     Unknown reaction.     Medications: I have reviewed the patient's current medications.  Results for orders placed during the hospital encounter of 02/18/12 (from the past 48 hour(s))  CBC     Status: Abnormal   Collection Time   02/18/12 10:36 AM      Component Value Range Comment   WBC 11.1 (*) 4.0 - 10.5 K/uL    RBC 4.70  3.87 - 5.11 MIL/uL    Hemoglobin 14.5  12.0 - 15.0 g/dL    HCT 09.8  11.9 - 14.7 %    MCV 88.3  78.0 - 100.0 fL    MCH 30.9  26.0 - 34.0 pg    MCHC 34.9  30.0 - 36.0 g/dL    RDW 82.9  56.2 - 13.0 %    Platelets 370  150 - 400 K/uL  COMPREHENSIVE METABOLIC PANEL     Status: Abnormal   Collection Time   02/18/12 10:36 AM      Component Value Range Comment   Sodium 138  135 - 145 mEq/L    Potassium 3.8  3.5 - 5.1 mEq/L    Chloride 101  96 - 112 mEq/L    CO2 26  19 - 32 mEq/L    Glucose, Bld 117 (*) 70 - 99 mg/dL    BUN 13  6 - 23 mg/dL    Creatinine, Ser 4.09  0.50 - 1.10 mg/dL    Calcium 81.1  8.4 - 10.5 mg/dL    Total Protein 8.3  6.0 - 8.3 g/dL    Albumin 4.6  3.5 - 5.2 g/dL    AST 20  0 - 37 U/L    ALT 20  0 - 35 U/L    Alkaline Phosphatase 72  39 - 117 U/L    Total Bilirubin 0.4  0.3 - 1.2 mg/dL    GFR calc non Af Amer >90  >90 mL/min    GFR calc Af Amer >90  >90  mL/min   ETHANOL     Status: Normal   Collection Time   02/18/12 10:36 AM      Component Value Range Comment   Alcohol, Ethyl (B) <11  0 - 11 mg/dL   URINE RAPID DRUG SCREEN (HOSP PERFORMED)     Status: Abnormal   Collection Time   02/18/12 12:41 PM      Component Value Range Comment   Opiates NONE DETECTED  NONE DETECTED    Cocaine NONE DETECTED  NONE DETECTED    Benzodiazepines POSITIVE (*) NONE DETECTED    Amphetamines NONE DETECTED  NONE DETECTED    Tetrahydrocannabinol NONE DETECTED  NONE DETECTED    Barbiturates NONE DETECTED  NONE DETECTED   POCT PREGNANCY, URINE     Status: Normal   Collection Time   02/18/12 12:48 PM      Component Value Range Comment   Preg Test, Ur NEGATIVE  NEGATIVE     No results found.  Positive for anxiety, bad mood, depression, obsessive compulsive disorder, repetitive activity, sleep disturbance and Thought blocking Blood pressure 148/64, pulse 90, temperature 98 F (36.7 C), temperature source Oral, resp. rate 20, SpO2 96.00%.   Assessment/Plan: Psychotic disorder, not otherwise specified. Anxiety disorder, not otherwise specified Obsessive-compulsive disorder traits Schizophreniform disorder, by history.  Recommended inpatient psychiatric hospitalization for safety and stabilization. Patient required medication management to stabilize her anxiety and failed as outpatient psychiatric treatment.  Arlesia Kiel,[name] 02/19/2012, 11:42 AM

## 2012-02-19 NOTE — ED Provider Notes (Signed)
Care assumed at shift change.  Patient is awake, alert and walking around the unit.  Her speech is slowed and her affect is flat.  She asks "is it the year 2013?"  Awaiting acceptance for inpatient treatment.  Geoffery Lyons, MD 02/19/12 [phone]

## 2012-02-19 NOTE — ED Notes (Signed)
Pt father here to visit. Okayed through security. Keys and wallet given to Victorino Dike, RN and locked into cabinet.

## 2012-02-19 NOTE — [facility] Counselor (Signed)
TC to [facility]. Spoke with [facility] who stated they did have bed availability. Faxed info over for review.

## 2012-02-19 NOTE — ED Notes (Signed)
Patient expressed the desire to take a shower. Patient reminded that she had already showered today. Patient said she needed to use the restroom. As patient was leaving the bathroom, she was noted to have wet hair. Patient did not take any shower belongings with her into the bathroom. When asked about our previous conversation about her already taking a shower today, she said that she forgot. Patient appears to have only wet her hair. Towel given to patient to dry off her hair if needed.
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
## 2012-02-19 NOTE — ED Notes (Signed)
Pt remains awake, pacing in her room. No s/s of distress noted.

## 2012-02-19 NOTE — ED Provider Notes (Signed)
Medical screening examination/treatment/procedure(s) were performed by non-physician practitioner and as supervising physician I was immediately available for consultation/collaboration.  Bernd Crom, MD 02/19/12 1420 

## 2012-02-19 NOTE — ED Notes (Signed)
Pt pacing and chanting to herself in her room, appearing anxious. Pt repeating "esperi". Pt unable to explain what she means. Pt unable to stop repeating phrase. RN redirected patient; pt sitting on bed.

## 2012-02-19 NOTE — ED Notes (Signed)
Pt awake, out in hallway requesting to see her father and sister. Pt states "I have to go now; I need to speak with them". Pt fidgeting and unable to stand still. Pt also with noted thought blocking. Pt informed that she cannot leave at this time. Pt redirected to her room.

## 2012-02-20 MED ORDER — HALOPERIDOL 5 MG PO TABS
5.0000 mg | ORAL_TABLET | Freq: Once | ORAL | Status: AC
  Administered 2012-02-20: 5 mg via ORAL
  Filled 2012-02-20: qty 1

## 2012-02-20 NOTE — BH Assessment (Signed)
Assessment Note   From initial 02/19/12 assessment -Nancy Drake is a 51 y.o. female who was brought in by [facility] due to displaying thought blocking and manic behavior at her physicians appointment 6/20. Pt is currently here voluntarily. Pt demonstrates thought blocking and was unable to answer this writers questions during the assessment. Per RN, prior to this Clinical research associate entering pt's room, pt was on her bed chanting incantations in what appeared to be a pray ritual. During assessment, pt repeated "esperidone" multiple times, and repeatedly touched her throat. Pt would then state "wait" and attempt to speak again. Pt was able to say she has been to [facility] before with her father and that she had a nightmare last night. Pt then continued to repeat "esperidone" and slowly shuffled towards this Clinical research associate. Pt has not slept since arriving in the ED. Per notes, pt presented to ED 6/19 with minor thought blocking, stating her head "feels full" and reporting a recent change in psychotropic medications. Pt was encouraged to follow up with her psychiatrist in outpatient at that time. Pt appears to have decompensated since that time. Pt appears to need hospitalization for further evaluation and stabilization.   Reassessment 02/20/12 - Pt's speech sometime incoherent. Pt denies AVH but pt appears to be responding to internal stimuli and thought blocking occuring. Writer unable to assess re: mood, anxiety, or sleep. Pt's affect is blunted. 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 She asks Clinical research associate if pt has told Clinical research associate about pt's daughter. When writer asks daughter's age, pt responds, "When I came into the hospital, she was 74 but I've been in here a while". Pt tells writer "You people are killing me. You aren't giving me my medicine and they are in my file". Pt denies SI and HI. No delusions noted. From 02/17/12 assessment, pt endorsed increasing anxiety over past 6 mos.     Axis I: Psychotic Disorder NOS Axis II:  Deferred Axis III:  Past Medical History  Diagnosis Date  . Anxiety   . 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 Cardiomyopathy   . Syncope     while walking on treadmill  . LBBB (left bundle branch block)     SEEMS TO BE CHRONIC   Axis IV: deferred Axis V: 11-20 some danger of hurting self or others possible OR occasionally fails to maintain minimal personal hygiene OR gross impairment in communication  Past Medical History:  Past Medical History  Diagnosis Date  . Anxiety   . 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 Cardiomyopathy   . Syncope     while walking on treadmill  . LBBB (left bundle branch block)     SEEMS TO BE CHRONIC    Past Surgical History  Procedure Date  . Cesarean section     Family History:  Family History  Problem Relation Age of Onset  . Other Other     uncle with pacemaker  . Heart disease Other     PACERMAKER    Social History:  reports that she has never smoked. She has never used smokeless tobacco. She reports that she drinks alcohol. She reports that she does not use illicit drugs.  Additional Social History:  Alcohol / Drug Use History of alcohol / drug use?: No history of alcohol / drug abuse  CIWA: CIWA-Ar BP: 167/102 mmHg Pulse Rate: 86  COWS:    Allergies:  Allergies  Allergen Reactions  . Desloratadine  Unknown reaction.     Home Medications:  (Not in a hospital admission)  OB/GYN Status:  No LMP recorded. Patient is not currently having periods (Reason: Perimenopausal).  General Assessment Data [facility] of Assessment: [facility] Living Arrangements: Children;Parent Can pt return to current living arrangement?: Yes Admission Status: Voluntary Is patient capable of signing voluntary admission?: No Transfer from: Acute Hospital Referral Source: MD  Education Status Is patient currently in school?: No  Risk to self Suicidal Ideation: No Suicidal Intent: No Is patient at risk for suicide?:  No Suicidal Plan?: No Access to Means: No Specify Access to Suicidal Means: n/a What has been your use of drugs/alcohol within the last 12 months?: none Previous Attempts/Gestures: No How many times?: 0  Other Self Harm Risks: none Triggers for Past Attempts:  (n/a) Intentional Self Injurious Behavior: None Family Suicide History: Unable to assess Recent stressful life event(s): Other (Comment) (unable to assess) Persecutory voices/beliefs?: No Depression: No Depression Symptoms:  (none noted) Substance abuse history and/or treatment for substance abuse?: No Suicide prevention information given to non-admitted patients: Not applicable  Risk to Others Homicidal Ideation: No Thoughts of Harm to Others: No Current Homicidal Intent: No Current Homicidal Plan: No Access to Homicidal Means: No Identified Victim: none History of harm to others?: No Assessment of Violence: None Noted Violent Behavior Description: none Does patient have access to weapons?: No Criminal Charges Pending?: No Does patient have a court date: No  Psychosis Hallucinations:  (pt denies but appears to be responding to internal stimuli) Delusions: None noted  Mental Status Report Appear/Hygiene: Disheveled Eye Contact: Fair Motor Activity: Unremarkable Speech: Incoherent;Soft;Logical/coherent Level of Consciousness: Quiet/awake Mood: Other (Comment) (pt didn't answer questions re: mood) Affect: Blunted Anxiety Level:  (pt didn't answer re: anxiety level) Thought Processes: Irrelevant (thought blocking) Judgement: Impaired Orientation: Unable to assess Obsessive Compulsive Thoughts/Behaviors:  (pt didn't answer )  Cognitive Functioning Concentration:  (unable to assess) Memory:  (unable to assess) IQ: Average Insight: Poor Impulse Control: Poor Appetite: Fair Weight Loss: 0  Weight Gain: 0  Sleep:  (pt didn't answer question) Total Hours of Sleep:  (unknown) Vegetative Symptoms:  None  ADLScreening [facility] Assessment Services) Patient's cognitive ability adequate to safely complete daily activities?: Yes Patient able to express need for assistance with ADLs?: Yes Independently performs ADLs?: Yes  Abuse/Neglect [facility]) Physical Abuse: Denies Verbal Abuse: Denies Sexual Abuse: Denies  Prior Inpatient Therapy Prior Inpatient Therapy: No Prior Therapy Dates: None  Prior Therapy Facilty/Provider(s): None  Reason for Treatment: None   Prior Outpatient Therapy Prior Outpatient Therapy: Yes Prior Therapy Dates: currently Prior Therapy Facilty/Provider(s): Dr. Donell Beers Reason for Treatment: med managment  ADL Screening (condition at time of admission) Patient's cognitive ability adequate to safely complete daily activities?: Yes Patient able to express need for assistance with ADLs?: Yes Independently performs ADLs?: Yes       Abuse/Neglect Assessment (Assessment to be complete while patient is alone) Physical Abuse: Denies Verbal Abuse: Denies Sexual Abuse: Denies Values / Beliefs Cultural Requests During Hospitalization: None Spiritual Requests During Hospitalization: None   Advance Directives (For Healthcare) Advance Directive: Patient does not have advance directive Pre-existing out of facility DNR order (yellow form or pink MOST form): No Nutrition Screen Diet: Regular Unintentional weight loss greater than 10lbs within the last month: No Problems chewing or swallowing foods and/or liquids: No Home Tube Feeding or Total Parenteral Nutrition (TPN): No Patient appears severely malnourished: No Pregnant or Lactating: No  Additional Information 1:1 In Past 12  Months?: No CIRT Risk: No Elopement Risk: No Does patient have medical clearance?: Yes     Disposition:  Disposition Disposition of Patient: Inpatient treatment program (Pt [facility] bed on [address] hall) Type of inpatient treatment program: Adult (Accepted [facility]  pending bed)  On Site Evaluation by:   Reviewed with Physician:     Donnamarie Rossetti P 02/20/2012 11:09 PM

## 2012-02-20 NOTE — ED Provider Notes (Addendum)
BP 133/82  Pulse 89  Temp 98.3 F (36.8 C) (Oral)  Resp 20  SpO2 97% Pt mildly agitated last night. Was given haldol and xanax. Pt is resting comfortably. Pt accepted at [facility] pending bed availability.    Loren Racer, MD 02/20/12 0716    BP 109/72  Pulse 86  Temp 98.1 F (36.7 C) (Oral)  Resp 16  SpO2 98% Pt is repetitive. No new issues. Awaiting bed.   Loren Racer, MD 02/21/12 [phone]

## 2012-02-21 NOTE — ED Notes (Signed)
Medicated with 1mg  Xanax as ordered. Will continue to monitor.

## 2012-02-22 ENCOUNTER — Telehealth: Payer: Self-pay | Admitting: Cardiology

## 2012-02-22 MED ORDER — LORAZEPAM 1 MG PO TABS
1.0000 mg | ORAL_TABLET | Freq: Once | ORAL | Status: AC
  Administered 2012-02-22: 1 mg via ORAL
  Filled 2012-02-22: qty 1

## 2012-02-22 MED ORDER — WHITE PETROLATUM GEL
Status: DC | PRN
  Administered 2012-02-22: 1 via TOPICAL
  Filled 2012-02-22: qty 5

## 2012-02-22 NOTE — BH Assessment (Signed)
Assessment Note From initial 02/19/12 assessment -Nancy Drake is a 51 y.o. female who was brought in by [facility] due to displaying thought blocking and manic behavior at her physicians appointment 6/20. Pt is currently here voluntarily. Pt demonstrates thought blocking and was unable to answer this writers questions during the assessment. Per RN, prior to this Clinical research associate entering pt's room, pt was on her bed chanting incantations in what appeared to be a pray ritual. During assessment, pt repeated "esperidone" multiple times, and repeatedly touched her throat. Pt would then state "wait" and attempt to speak again. Pt was able to say she has been to [facility] before with her father and that she had a nightmare last night. Pt then continued to repeat "esperidone" and slowly shuffled towards this Clinical research associate. Pt has not slept since arriving in the ED. Per notes, pt presented to ED 6/19 with minor thought blocking, stating her head "feels full" and reporting a recent change in psychotropic medications. Pt was encouraged to follow up with her psychiatrist in outpatient at that time. Pt appears to have decompensated since that time. Pt appears to need hospitalization for further evaluation and stabilization.  Reassessment 02/20/12 - Pt's speech sometime incoherent. Pt denies AVH but pt appears to be responding to internal stimuli and thought blocking occuring. Writer unable to assess re: mood, anxiety, or sleep. Pt's affect is blunted. 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 She asks Clinical research associate if pt has told Clinical research associate about pt's daughter. When writer asks daughter's age, pt responds, "When I came into the hospital, she was 89 but I've been in here a while". Pt tells writer "You people are killing me. You aren't giving me my medicine and they are in my file". Pt denies SI and HI. No delusions noted. From 02/17/12 assessment, pt endorsed increasing anxiety over past 6 mos.   Reassessment 02/22/12 - Pt speech coherent. She denies AVH.  Thought process is coherent and circumstantial. No thought blocking present. At times pt seems confused. Pt able to tell writer about pt's childhood in [place] and [place] in great detail. She reports euthymic mood. Pt endorses severe anxiety with panic attack night of 02/21/12. 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 Pt denies SI and HI.   Axis I: Psychotic Disorder NOS Axis II: Deferred Axis III:  Past Medical History  Diagnosis Date  . Anxiety   . 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 Cardiomyopathy   . Syncope     while walking on treadmill  . LBBB (left bundle branch block)     SEEMS TO BE CHRONIC   Axis IV: other psychosocial or environmental problems and problems related to social environment Axis V: 31-40 impairment in reality testing  Past Medical History:  Past Medical History  Diagnosis Date  . Anxiety   . 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 Cardiomyopathy   . Syncope     while walking on treadmill  . LBBB (left bundle branch block)     SEEMS TO BE CHRONIC    Past Surgical History  Procedure Date  . Cesarean section     Family History:  Family History  Problem Relation Age of Onset  . Other Other     uncle with pacemaker  . Heart disease Other     PACERMAKER    Social History:  reports that she  has never smoked. She has never used smokeless tobacco. She reports that she drinks alcohol. She reports that she does not use illicit drugs.  Additional Social History:  Alcohol / Drug Use History of alcohol / drug use?: No history of alcohol / drug abuse  CIWA: CIWA-Ar BP: 133/86 mmHg Pulse Rate: 100  COWS:    Allergies:  Allergies  Allergen Reactions  . Desloratadine     Unknown reaction.     Home Medications:  (Not in a hospital admission)  OB/GYN Status:  No LMP recorded. Patient is not currently having periods (Reason:  Perimenopausal).  General Assessment Data Location of Assessment: [facility] ED Living Arrangements: Children;Parent Can pt return to current living arrangement?: Yes Admission Status: Voluntary Is patient capable of signing voluntary admission?: No Transfer from: Acute Hospital Referral Source: MD  Education Status Is patient currently in school?: No Current Grade: n/a Highest grade of school patient has completed: 11 Name of school: [employer] in [place] person: n/a  Risk to self Suicidal Ideation: No Suicidal Intent: No Is patient at risk for suicide?: No Suicidal Plan?: No Access to Means: No Specify Access to Suicidal Means: n/a What has been your use of drugs/alcohol within the last 12 months?: non Previous Attempts/Gestures: No How many times?: 0  Other Self Harm Risks: non Triggers for Past Attempts:  (n/a) Intentional Self Injurious Behavior: None Family Suicide History: No Recent stressful life event(s): Other (Comment) (unable to assess) Persecutory voices/beliefs?: No Depression: No Depression Symptoms:  (none) Substance abuse history and/or treatment for substance abuse?: No Suicide prevention information given to non-admitted patients: Not applicable  Risk to Others Homicidal Ideation: No Thoughts of Harm to Others: No Current Homicidal Intent: No Current Homicidal Plan: No Access to Homicidal Means: No Identified Victim: none History of harm to others?: No Assessment of Violence: None Noted Violent Behavior Description: n/a Does patient have access to weapons?: No Criminal Charges Pending?: No Does patient have a court date: No  Psychosis Hallucinations: None noted Delusions: None noted  Mental Status Report Appear/Hygiene: Other (Comment) (unremarkable) Eye Contact: Good Motor Activity: Freedom of movement Speech: Logical/coherent;Soft Level of Consciousness: Alert Mood: Anxious Affect: Appropriate to circumstance Anxiety  Level: Panic Attacks Panic attack frequency: frequently Most recent panic attack: 02/21/12 Thought Processes: Coherent;Relevant;Circumstantial Judgement: Impaired Orientation: Person;Place;Situation;Time Obsessive Compulsive Thoughts/Behaviors: None  Cognitive Functioning Concentration: Decreased Memory: Remote Intact;Recent Intact IQ: Average Insight: Poor Impulse Control: Fair Appetite: Fair Weight Loss: 0  Weight Gain: 0  Sleep: No Change Total Hours of Sleep: 6  Vegetative Symptoms: None  ADLScreening [facility] Assessment Services) Patient's cognitive ability adequate to safely complete daily activities?: Yes Patient able to express need for assistance with ADLs?: Yes Independently performs ADLs?: Yes  Abuse/Neglect [facility]) Physical Abuse: Denies Verbal Abuse: Denies Sexual Abuse: Denies  Prior Inpatient Therapy Prior Inpatient Therapy: No Prior Therapy Dates: None  Prior Therapy Facilty/Provider(s): None  Reason for Treatment: None   Prior Outpatient Therapy Prior Outpatient Therapy: Yes Prior Therapy Dates: currently Prior Therapy Facilty/Provider(s): Dr. Donell Beers Reason for Treatment: med managment  ADL Screening (condition at time of admission) Patient's cognitive ability adequate to safely complete daily activities?: Yes Patient able to express need for assistance with ADLs?: Yes Independently performs ADLs?: Yes       Abuse/Neglect Assessment (Assessment to be complete while patient is alone) Physical Abuse: Denies Verbal Abuse: Denies Sexual Abuse: Denies Values / Beliefs Cultural Requests During Hospitalization: None Spiritual Requests During Hospitalization: None   Advance  Directives (For Healthcare) Advance Directive: Patient does not have advance directive Pre-existing out of facility DNR order (yellow form or pink MOST form): No Nutrition Screen Diet: Regular Unintentional weight loss greater than 10lbs within the last month: No Problems chewing  or swallowing foods and/or liquids: No Home Tube Feeding or Total Parenteral Nutrition (TPN): No Patient appears severely malnourished: No Pregnant or Lactating: No  Additional Information 1:1 In Past 12 Months?: No CIRT Risk: No Elopement Risk: No Does patient have medical clearance?: Yes     Disposition:  Disposition Disposition of Patient:  (Accepted [facility] pending available 400 hall bed) Type of inpatient treatment program: Adult  On Site Evaluation by:   Reviewed with Physician:     Donnamarie Rossetti P 02/22/2012 1:49 AM

## 2012-02-22 NOTE — Discharge Planning (Signed)
CSW spoke with patient who is more lucid and clear in thought process.  Previous assessment notes indicate same.  Patient expressed desire to be discharged. Patient to set up followup appointment with outpatient psychiatrist.  Discussed with EDP who agrees with disposition,.  Manson Passey Philipe Laswell ANN S , MSW, LCSWA 02/22/2012 9:48 AM [phone]

## 2012-02-22 NOTE — Discharge Instructions (Signed)
Follow up with your doctors in coming week.  Return to ER if worse, severe anxiety or depression, other concern.       Anxiety and Panic Attacks Your caregiver has informed you that you are having an anxiety or panic attack. There may be many forms of this. Most of the time these attacks come suddenly and without warning. They come at any time of day, including periods of sleep, and at any time of life. They may be strong and unexplained. Although panic attacks are very scary, they are physically harmless. Sometimes the cause of your anxiety is not known. Anxiety is a protective mechanism of the body in its fight or flight mechanism. Most of these perceived danger situations are actually nonphysical situations (such as anxiety over losing a job). CAUSES  The causes of an anxiety or panic attack are many. Panic attacks may occur in otherwise healthy people given a certain set of circumstances. There may be a genetic cause for panic attacks. Some medications may also have anxiety as a side effect. SYMPTOMS  Some of the most common feelings are:  Intense terror.   Dizziness, feeling faint.   Hot and cold flashes.   Fear of going crazy.   Feelings that nothing is real.   Sweating.   Shaking.   Chest pain or a fast heartbeat (palpitations).   Smothering, choking sensations.   Feelings of impending doom and that death is near.   Tingling of extremities, this may be from over-breathing.   Altered reality (derealization).   Being detached from yourself (depersonalization).  Several symptoms can be present to make up anxiety or panic attacks. DIAGNOSIS  The evaluation by your caregiver will depend on the type of symptoms you are experiencing. The diagnosis of anxiety or panic attack is made when no physical illness can be determined to be a cause of the symptoms. TREATMENT  Treatment to prevent anxiety and panic attacks may include:  Avoidance of circumstances that cause anxiety.     Reassurance and relaxation.   Regular exercise.   Relaxation therapies, such as yoga.   Psychotherapy with a psychiatrist or therapist.   Avoidance of caffeine, alcohol and illegal drugs.   Prescribed medication.  SEEK IMMEDIATE MEDICAL CARE IF:   You experience panic attack symptoms that are different than your usual symptoms.   You have any worsening or concerning symptoms.  Document Released: 08/17/2005 Document Revised: 08/06/2011 Document Reviewed: 12/19/2009 [facility] Patient Information 2012 [place], [place].       Depression, Adolescent and Adult Depression is a true and treatable medical condition. In general there are two kinds of depression:  Depression we all experience in some form. For example depression from the death of a loved one, financial distress or natural disasters will trigger or increase depression.   Clinical depression, on the other hand, appears without an apparent cause or reason. This depression is a disease. Depression may be caused by chemical imbalance in the body and brain or may come as a response to a physical illness. Alcohol and other drugs can cause depression.  DIAGNOSIS  The diagnosis of depression is usually based upon symptoms and medical history. TREATMENT  Treatments for depression fall into three categories. These are:  Drug therapy. There are many medicines that treat depression. Responses may vary and sometimes trial and error is necessary to determine the best medicines and dosage for a particular patient.   Psychotherapy, also called talking treatments, helps people resolve their problems by looking at them  from a different point of view and by giving people insight into their own personal makeup. Traditional psychotherapy looks at a childhood source of a problem. Other psychotherapy will look at current conflicts and move toward solving those. If the cause of depression is drug use, counseling is available to help abstain. In  time the depression will usually improve. If there were underlying causes for the chemical use, they can be addressed.   ECT (electroconvulsive therapy) or shock treatment is not as commonly used today. It is a very effective treatment for severe suicidal depression. During ECT electrical impulses are applied to the head. These impulses cause a generalized seizure. It can be effective but causes a loss of memory for recent events. Sometimes this loss of memory may include the last several months.  Treat all depression or suicide threats as serious. Obtain professional help. Do not wait to see if serious depression will get better over time without help. Seek help for yourself or those around you. In the [place] the number to the National Suicide Help Lines With 24 Hour Help Are: 1-800-SUICIDE 918-644-3570 Document Released: 08/14/2000 Document Revised: 08/06/2011 Document Reviewed: 04/04/2008 [facility] Patient Information 2012 [place], [place].

## 2012-02-22 NOTE — ED Provider Notes (Addendum)
Pt resting comfortably. Nad. Vitals normal. Discussed w act team - reeval pending.  Suzi Roots, MD 02/22/12 [phone]  Act team has reassessed. In past couple days note pt feels markedly improved. They feel pt stable for discharge. On recheck pt appears upbeat, is smiling, alert, conversant. States she had been feeling effect of a number of relatively recent stressors which had caused her to feel very anxious. States feels is now coping much better. States stressors and anxiety now seem manageable to her. Denies feeling depressed. Mood normal. No thoughts of harm to self or others. Optimistic about discharge to home and resuming her normal activities. States has f/u w Dr Drue Novel and Plovsky.     Suzi Roots, MD 02/22/12 1011  Suzi Roots, MD 02/22/12 [phone]

## 2012-02-22 NOTE — Telephone Encounter (Signed)
Pt will continue coreg 12.5mg  bid and lisinopril 5mg  bid. She will keep her appt with Dr Shirlee Latch 03/18/12.

## 2012-02-22 NOTE — Telephone Encounter (Signed)
Spoke with pt. Pt states she was recently hospitalized. Psychiatric meds and sleeping meds were adjusted. She is feeling some better.

## 2012-02-22 NOTE — Telephone Encounter (Signed)
Pt rtn dr Alford Highland call, was seen in [facility] ER, pls call also thinks she needs to se [facility] sooner 03-18-12/mt

## 2012-03-18 ENCOUNTER — Encounter: Payer: Self-pay | Admitting: Cardiology

## 2012-03-18 ENCOUNTER — Ambulatory Visit (INDEPENDENT_AMBULATORY_CARE_PROVIDER_SITE_OTHER): Payer: BC Managed Care – PPO | Admitting: Cardiology

## 2012-03-18 VITALS — BP 100/70 | HR 80 | Ht 62.0 in | Wt 133.0 lb

## 2012-03-18 DIAGNOSIS — I428 Other cardiomyopathies: Secondary | ICD-10-CM

## 2012-03-18 DIAGNOSIS — R55 Syncope and collapse: Secondary | ICD-10-CM

## 2012-03-18 NOTE — Patient Instructions (Signed)
Your physician wants you to follow-up in: 6 months with Dr McLean. (January 2014). You will receive a reminder letter in the mail two months in advance. If you don't receive a letter, please call our office to schedule the follow-up appointment.  

## 2012-03-21 NOTE — Assessment & Plan Note (Addendum)
Nonischemic cardiomyopathy.  EF was initially around 30%, improved to 43% on cardiac MRI.  Echo in 10/12 appeared to show EF around 40%.  No angiographic coronary disease.  Labs tests for ANA, HIV, SPEP, transferrin saturation have been unremarkable.  No family history of cardiomyopathy.  No heavy ETOH or drug abuse.  No myocardial delayed enhancement, so no definite evidence for prior MI, myocarditis, or infiltrative disease.  This may be a LBBB cardiomyopathy.  NYHA class I.  She will continue Coreg and lisinopril at current doses.  Repeat echo in a year or so.

## 2012-03-21 NOTE — Assessment & Plan Note (Signed)
Three week event monitor showed no worrisome arrhythmia.  She has not had any further syncopal or severe lightheaded events.  Will follow carefully.

## 2012-03-21 NOTE — Progress Notes (Signed)
Patient ID: Nancy Drake, female   DOB: 1961/04/17, 51 y.o.   MRN: [id number] PCP: Dr. Drue Novel  51 yo with history of LBBB and schizophreniform disorder presented initially for evaluation of abnormal echocardiogram and syncopal episode. In 1/12 while walking on a treadmill, patient felt lightheaded and flushed and passed out.  She landed on her face.  She was unconscious only for a few seconds she thinks.  She has had no syncopal or presyncopal episodes prior or since then.  As part of the workup for her syncopal episode, she had an echocardiogram.  This showed diffuse global LV hypokinesis.  EF was reported as 35-40%.  It looked more like 30% at the most to me.  As mentioned above, she has no history of chest pain.  No recent URI-type symptoms.  No history of HTN.  No family history of cardiomyopathy. She had a LBBB on ECG.  This had been seen in the past as well.   Left heart cath in 3/12 showed EF improved somewhat to 45-50% and no angiographic coronary disease.  Cardiac MRI showed EF 43% with no delayed enhancement.  3 week event monitor showed no significant arrhythmias.   Patient continues to do well symptomatically.  She walks for exercise.  No exertional dyspnea or chest pain.  No further syncope.  In 6/13, she went to the ER for a panic attack and ended up with a 3 day stay at [facility].  She is feeling better now.   ECG: NSR, LBBB  Labs (1/12): K 3.9, creatinine 0.8 Labs (2/12): K 4.4, creatinine 0.7, BNP 29, SPEP negative, ANA weakly positive (1:40), HIV negative, transferrin saturation 34%.  Labs (5/12): K 4.1, creatinine 0.7 Labs (9/12): K 3.9, creatinine 0.7 Labs (6/13): K 3.8, creatinine 0.72  Allergies (verified):  1)  ! * Clarinex  Past Medical History: 1. Anxiety 2. 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 thyroid dz? getting checked at Dr Talmage Nap  (3-11) 4. LBBB: This seems to be chronic. 5. Nonischemic cardiomyopathy: Echo (1/12) with diffuse hypokinesis and EF  reported as 35-40% (looked like 30% at the most to me), grade I diastolic dysfunction, mild mitral regurgitation.  Left heart cath (3/12) with EF improved to 45-50%, no angiographic CAD.  Cardiac MRI 3/12 showed EF 43%, mild global hypokinesis, mild LV dilation, normal RV size and systolic function, no myocardial delayed enhancement. HIV negative, transferrin saturation 34%, SPEP negative, ANA only weakly positive.  No heavy ETOH use.  Echo (10/12): EF 40%, mild MR, grade II diastolic dysfunction (my read).  6. Syncope while walking on treadmill. 3 week event monitor in 2/12 showed no significant events.   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 ALPRAZolam (XANAX) 1 MG tablet Take 0.5-1 mg by mouth 4 (four) times daily as needed. For anxiety      . carvedilol (COREG) 12.5 MG tablet Take 12.5 mg by mouth 2 (two) times daily with a meal.      . lisinopril (PRINIVIL,ZESTRIL) 5 MG tablet Take 5 mg by mouth 2 (two) times daily.      . Multiple Vitamin (MULTIVITAMIN WITH MINERALS) TABS Take 1 tablet by mouth daily.      . risperiDONE (RISPERDAL) 0.5 MG tablet  Take 0.5 mg by mouth 2 (two) times daily.         BP 100/70  Pulse 80  Ht 5\' 2"  (1.575 m)  Wt 133 lb (60.328 kg)  BMI 24.33 kg/m2 General: NAD Neck: No JVD, no thyromegaly or thyroid nodule.  Lungs: Clear to auscultation bilaterally with normal respiratory effort. CV: Nondisplaced PMI.  Heart regular S1/S2, no S3/S4, no murmur.  No peripheral edema.  No carotid bruit.  Normal pedal pulses.  Abdomen: Soft, nontender, no hepatosplenomegaly, no distention.  Neurologic: Alert and oriented x 3.  Psych: Normal affect. Extremities: No clubbing or  cyanosis.

## 2012-03-31 DIAGNOSIS — I442 Atrioventricular block, complete: Secondary | ICD-10-CM

## 2012-03-31 HISTORY — PX: PACEMAKER INSERTION: SHX728

## 2012-03-31 HISTORY — DX: Atrioventricular block, complete: I44.2

## 2012-04-08 ENCOUNTER — Encounter (HOSPITAL_COMMUNITY)
Admission: EM | Disposition: A | Discharge status: Home / Self Care | Payer: Self-pay | Source: Home / Self Care | Attending: Internal Medicine

## 2012-04-08 ENCOUNTER — Encounter (HOSPITAL_COMMUNITY): Payer: Self-pay | Admitting: *Deleted

## 2012-04-08 ENCOUNTER — Emergency Department (HOSPITAL_COMMUNITY): Payer: BC Managed Care – PPO

## 2012-04-08 ENCOUNTER — Inpatient Hospital Stay (HOSPITAL_COMMUNITY)
Admission: EM | Admit: 2012-04-08 | Discharge: 2012-04-13 | DRG: 116 | Disposition: A | Discharge status: Home / Self Care | Payer: BC Managed Care – PPO | Attending: Internal Medicine | Admitting: Internal Medicine

## 2012-04-08 ENCOUNTER — Other Ambulatory Visit: Payer: Self-pay

## 2012-04-08 DIAGNOSIS — I447 Left bundle-branch block, unspecified: Secondary | ICD-10-CM | POA: Diagnosis present

## 2012-04-08 DIAGNOSIS — I442 Atrioventricular block, complete: Principal | ICD-10-CM | POA: Diagnosis present

## 2012-04-08 DIAGNOSIS — F411 Generalized anxiety disorder: Secondary | ICD-10-CM | POA: Diagnosis present

## 2012-04-08 DIAGNOSIS — I5023 Acute on chronic systolic (congestive) heart failure: Secondary | ICD-10-CM

## 2012-04-08 DIAGNOSIS — Z79899 Other long term (current) drug therapy: Secondary | ICD-10-CM

## 2012-04-08 DIAGNOSIS — R000 Tachycardia, unspecified: Secondary | ICD-10-CM | POA: Diagnosis present

## 2012-04-08 DIAGNOSIS — I5022 Chronic systolic (congestive) heart failure: Secondary | ICD-10-CM

## 2012-04-08 DIAGNOSIS — R55 Syncope and collapse: Secondary | ICD-10-CM | POA: Diagnosis present

## 2012-04-08 DIAGNOSIS — F209 Schizophrenia, unspecified: Secondary | ICD-10-CM | POA: Diagnosis present

## 2012-04-08 DIAGNOSIS — Z950 Presence of cardiac pacemaker: Secondary | ICD-10-CM

## 2012-04-08 DIAGNOSIS — I428 Other cardiomyopathies: Secondary | ICD-10-CM

## 2012-04-08 HISTORY — PX: TEMPORARY PACEMAKER INSERTION: SHX5471

## 2012-04-08 LAB — COMPREHENSIVE METABOLIC PANEL
Albumin: 3.2 g/dL — ABNORMAL LOW (ref 3.5–5.2)
BUN: 11 mg/dL (ref 6–23)
Chloride: 105 mEq/L (ref 96–112)
Creatinine, Ser: 0.99 mg/dL (ref 0.50–1.10)
Total Bilirubin: 0.1 mg/dL — ABNORMAL LOW (ref 0.3–1.2)

## 2012-04-08 LAB — CBC
HCT: 40.9 % (ref 36.0–46.0)
Hemoglobin: 14 g/dL (ref 12.0–15.0)
MCH: 30.6 pg (ref 26.0–34.0)
MCHC: 34.2 g/dL (ref 30.0–36.0)
MCV: 89.3 fL (ref 78.0–100.0)
RDW: 12.7 % (ref 11.5–15.5)

## 2012-04-08 LAB — CBC WITH DIFFERENTIAL/PLATELET
Basophils Relative: 0 % (ref 0–1)
Eosinophils Absolute: 0.2 10*3/uL (ref 0.0–0.7)
HCT: 37 % (ref 36.0–46.0)
Hemoglobin: 12.3 g/dL (ref 12.0–15.0)
MCH: 30 pg (ref 26.0–34.0)
MCHC: 33.2 g/dL (ref 30.0–36.0)
Monocytes Absolute: 1.1 10*3/uL — ABNORMAL HIGH (ref 0.1–1.0)
Monocytes Relative: 8 % (ref 3–12)
Neutro Abs: 8.6 10*3/uL — ABNORMAL HIGH (ref 1.7–7.7)

## 2012-04-08 LAB — PRO B NATRIURETIC PEPTIDE: Pro B Natriuretic peptide (BNP): 173.4 pg/mL — ABNORMAL HIGH (ref 0–125)

## 2012-04-08 LAB — PROTIME-INR: Prothrombin Time: 13.6 seconds (ref 11.6–15.2)

## 2012-04-08 LAB — URINALYSIS, ROUTINE W REFLEX MICROSCOPIC
Bilirubin Urine: NEGATIVE
Hgb urine dipstick: NEGATIVE
Ketones, ur: NEGATIVE mg/dL
Protein, ur: NEGATIVE mg/dL
Urobilinogen, UA: 0.2 mg/dL (ref 0.0–1.0)

## 2012-04-08 LAB — TROPONIN I: Troponin I: <0.3 ng/mL (ref <0.30)

## 2012-04-08 LAB — CREATININE, SERUM: GFR calc non Af Amer: >90 mL/min (ref >90)

## 2012-04-08 LAB — CARDIAC PANEL(CRET KIN+CKTOT+MB+TROPI): Relative Index: INVALID (ref 0.0–2.5)

## 2012-04-08 SURGERY — TEMPORARY PACEMAKER INSERTION
Anesthesia: LOCAL | Laterality: Right

## 2012-04-08 MED ORDER — SODIUM CHLORIDE 0.9 % IJ SOLN: 3.0000 mL | INTRAMUSCULAR | Status: DC | PRN

## 2012-04-08 MED ORDER — LIDOCAINE HCL (PF) 1 % IJ SOLN
INTRAMUSCULAR | Status: AC
  Filled 2012-04-08: qty 30

## 2012-04-08 MED ORDER — HEPARIN (PORCINE) IN NACL 2-0.9 UNIT/ML-% IJ SOLN
INTRAMUSCULAR | Status: AC
  Filled 2012-04-08: qty 1000

## 2012-04-08 MED ORDER — ATROPINE SULFATE 1 MG/ML IJ SOLN
INTRAMUSCULAR | Status: AC | PRN
  Administered 2012-04-08 (×2): 1 mg via INTRAVENOUS

## 2012-04-08 MED ORDER — ALPRAZOLAM 0.25 MG PO TABS
0.5000 mg | ORAL_TABLET | Freq: Four times a day (QID) | ORAL | Status: DC | PRN
  Administered 2012-04-08: 1 mg via ORAL
  Administered 2012-04-09 – 2012-04-11 (×6): 0.5 mg via ORAL
  Administered 2012-04-11: 1 mg via ORAL
  Administered 2012-04-12 – 2012-04-13 (×4): 0.5 mg via ORAL
  Filled 2012-04-08: qty 2
  Filled 2012-04-08: qty 1
  Filled 2012-04-08: qty 4
  Filled 2012-04-08 (×5): qty 2
  Filled 2012-04-08 (×2): qty 1
  Filled 2012-04-08: qty 4
  Filled 2012-04-08: qty 1
  Filled 2012-04-08 (×2): qty 2

## 2012-04-08 MED ORDER — NITROGLYCERIN 0.4 MG SL SUBL: 0.4000 mg | SUBLINGUAL_TABLET | SUBLINGUAL | Status: DC | PRN

## 2012-04-08 MED ORDER — OXYCODONE-ACETAMINOPHEN 5-325 MG PO TABS
1.0000 | ORAL_TABLET | ORAL | Status: DC | PRN
  Administered 2012-04-09 – 2012-04-10 (×4): 2 via ORAL
  Administered 2012-04-10 – 2012-04-11 (×2): 1 via ORAL
  Filled 2012-04-08 (×2): qty 2
  Filled 2012-04-08: qty 1
  Filled 2012-04-08: qty 2
  Filled 2012-04-08: qty 1
  Filled 2012-04-08: qty 2

## 2012-04-08 MED ORDER — ENOXAPARIN SODIUM 40 MG/0.4ML ~~LOC~~ SOLN
40.0000 mg | SUBCUTANEOUS | Status: DC
  Administered 2012-04-09 – 2012-04-10 (×2): 40 mg via SUBCUTANEOUS
  Filled 2012-04-08 (×5): qty 0.4

## 2012-04-08 MED ORDER — ONDANSETRON HCL 4 MG/2ML IJ SOLN
4.0000 mg | Freq: Four times a day (QID) | INTRAMUSCULAR | Status: DC | PRN
  Filled 2012-04-08: qty 2

## 2012-04-08 MED ORDER — RISPERIDONE 0.5 MG PO TABS
0.5000 mg | ORAL_TABLET | Freq: Two times a day (BID) | ORAL | Status: DC
  Administered 2012-04-08 – 2012-04-11 (×6): 0.5 mg via ORAL
  Filled 2012-04-08 (×7): qty 1

## 2012-04-08 MED ORDER — ATROPINE SULFATE 1 MG/ML IJ SOLN
INTRAMUSCULAR | Status: AC | PRN
  Administered 2012-04-08: 1 mg via INTRAVENOUS

## 2012-04-08 MED ORDER — ONDANSETRON HCL 4 MG/2ML IJ SOLN: 4.0000 mg | Freq: Four times a day (QID) | INTRAMUSCULAR | Status: DC | PRN

## 2012-04-08 MED ORDER — SODIUM CHLORIDE 0.9 % IV SOLN
250.0000 mL | INTRAVENOUS | Status: DC | PRN
  Administered 2012-04-08: 21:00:00 via INTRAVENOUS

## 2012-04-08 MED ORDER — DOPAMINE-DEXTROSE 3.2-5 MG/ML-% IV SOLN
2.0000 ug/kg/min | Freq: Once | INTRAVENOUS | Status: AC
  Administered 2012-04-08: 5 ug/kg/min via INTRAVENOUS

## 2012-04-08 MED ORDER — MORPHINE SULFATE 2 MG/ML IJ SOLN
1.0000 mg | INTRAMUSCULAR | Status: DC | PRN
  Administered 2012-04-09: 1 mg via INTRAVENOUS
  Filled 2012-04-08: qty 1

## 2012-04-08 MED ORDER — ALPRAZOLAM 0.25 MG PO TABS: 0.2500 mg | ORAL_TABLET | Freq: Two times a day (BID) | ORAL | Status: DC | PRN

## 2012-04-08 MED ORDER — POTASSIUM CHLORIDE CRYS ER 20 MEQ PO TBCR
40.0000 meq | EXTENDED_RELEASE_TABLET | Freq: Once | ORAL | Status: AC
  Administered 2012-04-08: 40 meq via ORAL
  Filled 2012-04-08: qty 2

## 2012-04-08 MED ORDER — DOPAMINE-DEXTROSE 3.2-5 MG/ML-% IV SOLN
INTRAVENOUS | Status: AC
  Administered 2012-04-08: 5 ug/kg/min via INTRAVENOUS
  Filled 2012-04-08: qty 250

## 2012-04-08 MED ORDER — ACETAMINOPHEN 325 MG PO TABS: 650.0000 mg | ORAL_TABLET | ORAL | Status: DC | PRN

## 2012-04-08 MED ORDER — ZOLPIDEM TARTRATE 5 MG PO TABS
5.0000 mg | ORAL_TABLET | Freq: Every evening | ORAL | Status: DC | PRN
  Administered 2012-04-09 – 2012-04-10 (×2): 5 mg via ORAL
  Filled 2012-04-08 (×2): qty 1

## 2012-04-08 MED ORDER — ZOLPIDEM TARTRATE 5 MG PO TABS: 5.0000 mg | ORAL_TABLET | Freq: Every evening | ORAL | Status: DC | PRN

## 2012-04-08 MED ORDER — DOPAMINE-DEXTROSE 3.2-5 MG/ML-% IV SOLN: 2.0000 ug/kg/min | Freq: Once | INTRAVENOUS | Status: DC

## 2012-04-08 MED ORDER — ONDANSETRON HCL 4 MG/2ML IJ SOLN
INTRAMUSCULAR | Status: AC
  Administered 2012-04-09: 4 mg via INTRAVENOUS
  Filled 2012-04-08: qty 2

## 2012-04-08 MED ORDER — SODIUM CHLORIDE 0.9 % IJ SOLN
3.0000 mL | Freq: Two times a day (BID) | INTRAMUSCULAR | Status: DC
  Administered 2012-04-09: 3 mL via INTRAVENOUS
  Administered 2012-04-09: 23:00:00 via INTRAVENOUS
  Administered 2012-04-09 – 2012-04-11 (×5): 3 mL via INTRAVENOUS
  Administered 2012-04-12: 10:00:00 via INTRAVENOUS

## 2012-04-08 NOTE — ED Notes (Signed)
Family at bedside. 

## 2012-04-08 NOTE — Interval H&P Note (Deleted)
History and Physical Interval Note:  04/08/2012 8:35 PM  Nancy Drake  has presented today for temporary pacemaker, transvenous.  with the diagnosis of Bradycardia, complete heart block.   The various methods of treatment have been discussed with the patient and family. After consideration of risks, benefits and other options for treatment, the patient has consented to  Procedure(s) (LRB): TEMPORARY PACEMAKER INSERTION (Right) as a surgical intervention .  The patient's history has been reviewed, patient examined, no change in status, stable for surgery.  I have reviewed the patient's chart and labs.  Questions were answered to the patient's satisfaction.     MCALHANY,CHRISTOPHER

## 2012-04-08 NOTE — ED Notes (Signed)
Patient responded to atropine; heart rate now 101.

## 2012-04-08 NOTE — ED Provider Notes (Signed)
History     CSN: [id number]  Arrival date & time 04/08/12  1905   First MD Initiated Contact with Patient 04/08/12 1909      Chief Complaint  Patient presents with  . Bradycardia    (Consider location/radiation/quality/duration/timing/severity/associated sxs/prior treatment) The history is provided by the patient and the EMS personnel.  HISTORY OF PRESENT ILLNESS:  51 yo female who presents via EMS for bradycardia.  EMS was called for a syncopal episode and altered mental status.  On EMS arrival patient's heart rate was 24.  She was started on external pacing.  On arrival to the emergency department the patient was alert and being externally paced.  She reports feeling lightheaded and dizzy just prior to syncopal episode.  History of cardiomyopathy.  Pt reports having a syncopal episode approximately 2 weeks ago.  Past Medical History  Diagnosis Date  . Anxiety   . 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 Cardiomyopathy   . Syncope     while walking on treadmill  . LBBB (left bundle branch block)     SEEMS TO BE CHRONIC    Past Surgical History  Procedure Date  . Cesarean section     Family History  Problem Relation Age of Onset  . Other Other     uncle with pacemaker  . Heart disease Other     PACERMAKER    History  Substance Use Topics  . Smoking status: Never Smoker   . Smokeless tobacco: Never Used  . Alcohol Use: No     socially    OB History    Grav Para Term Preterm Abortions TAB SAB Ect Mult Living                  Review of Systems  Constitutional: Negative.   HENT: Negative.   Eyes: Negative.   Respiratory: Negative for shortness of breath.   Cardiovascular: Negative for chest pain.  Gastrointestinal: Positive for nausea. Negative for vomiting, diarrhea and constipation.  Genitourinary: Negative.   Musculoskeletal: Negative.   Skin: Negative.   Neurological: Positive for syncope.  All other systems reviewed and are  negative.    Allergies  Desloratadine  Home Medications   No current outpatient prescriptions on file.  BP 150/85  Pulse 85  Temp 98 F (36.7 C) (Oral)  Resp 17  Ht 5\' 1"  (1.549 m)  Wt 136 lb 7.4 oz (61.9 kg)  BMI 25.78 kg/m2  SpO2 99%  LMP 02/21/2012  Physical Exam  Nursing note and vitals reviewed. Constitutional: She appears well-developed and well-nourished. She appears distressed.  HENT:  Head: Normocephalic and atraumatic.  Eyes: Conjunctivae are normal.  Neck: Neck supple.  Cardiovascular: Regular rhythm and normal heart sounds.  Bradycardia present.   Pulmonary/Chest: Effort normal and breath sounds normal. She has no wheezes. She has no rales.  Abdominal: Soft. She exhibits no distension. There is no tenderness.  Musculoskeletal: Normal range of motion.  Neurological: She is alert.  Skin: Skin is warm and dry.    ED Course  Procedures (including critical care time)  Labs Reviewed  CBC WITH DIFFERENTIAL - Abnormal; Notable for the following:    WBC 13.5 (*)     Neutro Abs 8.6 (*)     Monocytes Absolute 1.1 (*)     All other components within normal limits  COMPREHENSIVE METABOLIC PANEL - Abnormal; Notable for the following:    Glucose, Bld 151 (*)     Albumin 3.2 (*)  AST 39 (*)     ALT 56 (*)     Total Bilirubin 0.1 (*)     GFR calc non Af Amer 66 (*)     GFR calc Af Amer 76 (*)     All other components within normal limits  PRO B NATRIURETIC PEPTIDE - Abnormal; Notable for the following:    Pro B Natriuretic peptide (BNP) 173.4 (*)     All other components within normal limits  CBC - Abnormal; Notable for the following:    WBC 13.9 (*)     All other components within normal limits  TROPONIN I  MAGNESIUM  URINALYSIS, ROUTINE W REFLEX MICROSCOPIC  MRSA PCR SCREENING  CARDIAC PANEL(CRET KIN+CKTOT+MB+TROPI)  PROTIME-INR  CREATININE, SERUM  CARDIAC PANEL(CRET KIN+CKTOT+MB+TROPI)  CARDIAC PANEL(CRET KIN+CKTOT+MB+TROPI)  HEMOGLOBIN A1C    Dg Chest Portable 1 View  04/08/2012  *RADIOLOGY REPORT*  Clinical Data: Weakness, bradycardia  PORTABLE CHEST - 1 VIEW  Comparison: Chest radiograph 01/21/2006  Findings: Normal cardiac silhouette.  No effusion, infiltrate, or pneumothorax. No osseous abnormality.  IMPRESSION: No acute cardiopulmonary process.  Original Report Authenticated By: Genevive Bi, M.D.     1. Complete heart block   2. Acute on chronic systolic heart failure   3. CHB (complete heart block)       MDM  51 yo female with PMHx of cardiomyopathy, LBBB, syncope who presented via EMS for bradycardia requiring pacing.  Pt had syncopal episode today and on EMS arrival had HR of 24.  She was externally paced on arrival to the ED with poor capture.  Pt initially alert, but shortly after arrival became unresponsive.  She was noted to be in complete heart block on the monitor with no capture on external pacing.  Pulse was absent, CPR was started and pt was given 1mg  Atropine.  Pt responded to atropine with return of HR in the 90's and returned alertness.  EKG with LBBB after ROSC.  Cardiology was consulted and at bedside.  While at bedside, pt had another episode of bradycardia.  CPR was again performed and pt was given Atropine with return of heart rate in the 90's.  Cardiology will take pt straight to the cath lab for placement of a temp pacer.        Cherre Robins, MD 04/09/12 [phone]

## 2012-04-08 NOTE — Code Documentation (Signed)
Pt is A&Ox3; asking questions and responding normally.

## 2012-04-08 NOTE — ED Notes (Addendum)
CPR started; Atropine 1 mg given.

## 2012-04-08 NOTE — CV Procedure (Signed)
   Cardiac Catheterization Operative Report  Nancy Drake [id number] 8/9/20138:57 PM Willow Ora, MD  Procedure Performed:  1. Placement of transvenous pacemaker in the right ventricle via the right femoral vein.   Operator: Verne Carrow, MD  Indication:  Syncope with high grade heart block (complete heart block).                                   Procedure Details: The risks, benefits, complications, treatment options, and expected outcomes were discussed with the patient. Emergency verbal consent was obtained. The patient and/or family concurred with the proposed plan, giving informed verbal consent. The patient was brought to the cath lab from teh ED.  The patient was given Zofran 4 mg IV x 1 for nausea with emesis x 1 upon cath lab arrive. The right groin was prepped and draped in the usual manner. Using the modified Seldinger access technique, a 6 French sheath was placed in the right femoral vein. A transvenous pacing wire was advanced from the right femoral vein into the right ventricle. Good capture was achieved. She arrived in the cath lab on dopamine gtt with HR of 95. The dopamine was reduced in the cath lab.   There were no immediate complications. The patient was taken to the CCU in stable condition.   Hemodynamic Findings: Peripheral aortic pressure: 144/77  Impression: 1. Complete heart block with syncope 2. Successful placement of transvenous pacemaker   Recommendations: Continue temporary pacemaker overnight and will wean dopamine drip. If she continues to require the pacemaker, will have EP evaluate for permanent pacemaker.        Complications:  None; patient tolerated the procedure well.

## 2012-04-08 NOTE — ED Notes (Signed)
Dopamine started at 5 mcg per cardiologist.

## 2012-04-08 NOTE — ED Provider Notes (Signed)
51 year old female who was brought in by EMS because of low blood pressure and bradycardia. She has a history of a cardiomyopathy, and states that she had an episode of syncope 2 weeks ago which he blamed on taking her medications on an empty stomach. Today, she felt weak and dizzy. EMS noted heart rate of 24 And Pl. an external pacemaker on her with capture at a rate of 60. She denies chest pain, heaviness, tightness, pressure, nausea, vomiting. In the ED, she was noted to have twitching with the pacemaker but inconsistent capture. Eventually, she stopped having any capture whatsoever and was in complete heart block with ventricular asystole. CPR was started and she was given atropine which did result in a heart rate of 99. Her records were reviewed and her last ejection fraction was noted to be 45%. The only indication she is on which should affect her heart rate was carvedilol 12.5 mg. Cardiology was consulted and Dr. Tenny Craw has come to the ED. While Dr. Tenny Craw is here, her heart rate dropped back down to 0 and CPR was restarted and she was given a second dose of atropine which again resulted in the heart rate going up to 99. She was started on a dopamine drip.   Date: 04/08/2012  Rate:  99  Rhythm: normal sinus rhythm  QRS Axis: normal  Intervals: normal  ST/T Wave abnormalities: normal  Conduction Disutrbances:left bundle branch block  Narrative Interpretation: Ends block. No prior ECG available for comparison.  Old EKG Reviewed: none available  CRITICAL CARE Performed by: ZOXWR,UEAVW   Total critical care time: 45 minutes  Critical care time was exclusive of separately billable procedures and treating other patients.  Critical care was necessary to treat or prevent imminent or life-threatening deterioration.  Critical care was time spent personally by me on the following activities: development of treatment plan with patient and/or surrogate as well as nursing, discussions with consultants,  evaluation of patient's response to treatment, examination of patient, obtaining history from patient or surrogate, ordering and performing treatments and interventions, ordering and review of laboratory studies, ordering and review of radiographic studies, pulse oximetry and re-evaluation of patient's condition.   Dione Booze, MD 04/08/12 [phone]

## 2012-04-08 NOTE — ED Notes (Signed)
X-ray at bedside

## 2012-04-08 NOTE — ED Notes (Signed)
Pt taken to cath lab at this time with Liz Beach, RN.

## 2012-04-08 NOTE — Code Documentation (Signed)
Cardiology at bedside.

## 2012-04-08 NOTE — ED Notes (Signed)
Called by daughter for altered mental status.  On arrival of EMS patient was Hypotension and HR of 24.  Placed on pacer at  60bpm with mechanical capture at 80.  Patient alert on arrival.

## 2012-04-08 NOTE — ED Notes (Signed)
Pt continues to remain alert; heart rate continues to stay in 90's. BP stable. Family at bedside. Awaiting cath lab.

## 2012-04-08 NOTE — H&P (Deleted)
History and Physical   Patient ID: Nancy Drake MRN: [id number], DOB/AGE: Sep 09, 1960 51 y.o. Date of Encounter: 04/08/2012  Primary Physician: Willow Ora, MD Primary Cardiologist: DM  Chief Complaint:  Syncope  HPI: Nancy Drake is a 51 year old female with a history of cardiomyopathy. Today she was in her usual state of health when she had a syncopal episode. After the syncopal episode she felt nauseated and needed to have a bowel movement. She also felt weak. Her daughter helped her to the bathroom. In the bathroom she vomited once and had a bowel movement. After that she felt some better but was still very weak. EMS had been called and when they did an ECG, it showed complete heart block with a ventricular rate in the 20s. She had normal P waves. They began and externally pacing her and transported her to [facility] emergency room.  In the emergency room, the transcutaneous pacer would not despite adjusting the settings. She was diaphoretic and very pale. She lost consciousness and required a brief episode of CPR. She was given atropine, totaling 2 mg. After the second atropine she responded and is now in sinus rhythm with a heart rate of 97. She feels much better and is no longer diaphoretic. At no time did she have chest pain or palpitations. She is currently on IV Dopamine 5 mcg/kg/min.  Past Medical History  Diagnosis Date  . Anxiety   . 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 Cardiomyopathy   . Syncope     while walking on treadmill  . LBBB (left bundle branch block)     SEEMS TO BE CHRONIC     Surgical History:  Past Surgical History  Procedure Date  . Cesarean section      I have reviewed the patient's current medications. Medication Sig  ALPRAZolam (XANAX) 1 MG tablet Take 0.5-1 mg by mouth 4 (four) times daily as needed. For anxiety  carvedilol (COREG) 12.5 MG tablet Take 12.5 mg by mouth 2 (two) times daily with a meal.  lisinopril (PRINIVIL,ZESTRIL) 5 MG  tablet Take 5 mg by mouth 2 (two) times daily.  Multiple Vitamin (MULTIVITAMIN WITH MINERALS) TABS Take 1 tablet by mouth daily.  risperiDONE (RISPERDAL) 0.5 MG tablet Take 0.5 mg by mouth 2 (two) times daily.     Scheduled Meds:  Continuous Infusions:  PRN Meds:.atropine, atropine  Allergies:  Allergies  Allergen Reactions  . Desloratadine     Unknown reaction.    History   Social History  . Marital Status: Divorced    Spouse Name: N/A    Number of Children: 2  . 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 Smoking status: Never Smoker   . Smokeless tobacco: Never Used  . Alcohol Use: 0.0 oz/week     socially  . Drug Use: No  . Sexually Active: Not on file   Other Topics Concern  . Not on file   Social History Narrative   BORN IN [place]    Family History  Problem Relation Age of Onset  . Other Other     uncle with pacemaker  . Heart disease Other     PACERMAKER     Review of Systems: See HPI.  Full 14-point review of systems otherwise negative except as noted above.   Physical Exam: Blood pressure 160/90, pulse 97, temperature 97.8 F (36.6 C), temperature source Oral, resp.  rate 19, SpO2 95.00%. General: Well developed, well nourished, female in moderate distress. Head: Normocephalic, atraumatic, sclera non-icteric, no xanthomas, nares are without discharge. Dentition: good Neck: no carotid bruits. JVD not elevated. No thyromegally Lungs: Good expansion bilaterally. without wheezes or rhonchi. Clear to auscultation bilaterally Heart: Regular rate and rhythm with S1 S2.  No S3 or S4.  No murmur, no rubs, or gallops appreciated. Abdomen: Soft, non-tender, non-distended with normoactive bowel sounds. No hepatomegaly. No rebound/guarding. No obvious abdominal masses. Msk:  Strength and tone appear normal for age. No joint deformities or effusions, no spine or costo-vertebral angle tenderness. Extremities: No  clubbing or cyanosis. No edema.  Distal pedal pulses are 2+ in 4 extrem Neuro: Alert and oriented X 3. Moves all extremities spontaneously. No focal deficits noted. Psych:  Responds to questions appropriately but appears anxious. Skin: No rashes or lesions noted  Labs:  pending  Radiology/Studies:  pending  Echo: 06/24/2011 Study Conclusions - Left ventricle: There may be some increase in LV filling pressure. Septal dyssynergy. Mild relative hypokinesis of the lateral wall. The cavity size was normal. Wall thickness was increased in a pattern of mild LVH. Systolic function was mildly reduced. The estimated ejection fraction was in the range of 45% to 50%. - Mitral valve: Flat closure of the mitral valve. Mild regurgitation. - Impressions: There has been some improvement in LV function since 08/2010.   ECG: EMS ECG showed CHB with a ventricular rate in the 20's    ASSESSMENT AND PLAN:  Principal Problem:  *CHB (complete heart block) - felt to be the cause of the syncope. She has responded to atropine and Dopamine.  Plan to pace a temporary pacing wire with tx to CCU.  CHF  Patient has received 2 L IV fluid since arrival to ER.  Will follow strict I/Os  Schizophreniform disorder.  Continue risperidal.     Signed,  Theodore Demark PA-C 04/08/2012, 7:42 PM   Patient seen and examined.  Agree with findings of Nancy Drake as noted above. Patient currently on IV Dopamine 5 mcg/kg/min  HR 94  Lungs:  CTA   Card:  RRR.  Abdomen:  Normal BS  Nontender.  No S3.  Ext:  No edema.  2+ pulses. With 2nd episode of CHB will plan for temporary wire.  Can continue DA and follow. EP to see patient re PPM.   WIll need to follow fluid (I/Os)  Closely over the weekend. Discontinue coreg

## 2012-04-09 ENCOUNTER — Encounter (HOSPITAL_COMMUNITY): Payer: Self-pay | Admitting: *Deleted

## 2012-04-09 LAB — CARDIAC PANEL(CRET KIN+CKTOT+MB+TROPI)
CK, MB: 1.4 ng/mL (ref 0.3–4.0)
Relative Index: INVALID (ref 0.0–2.5)
Total CK: 53 U/L (ref 7–177)
Troponin I: <0.3 ng/mL (ref <0.30)

## 2012-04-09 MED ORDER — ONDANSETRON HCL 4 MG/2ML IJ SOLN
4.0000 mg | Freq: Four times a day (QID) | INTRAMUSCULAR | Status: DC | PRN
  Administered 2012-04-09 – 2012-04-10 (×2): 4 mg via INTRAVENOUS
  Filled 2012-04-09: qty 2

## 2012-04-09 MED ORDER — BIOTENE DRY MOUTH MT LIQD
15.0000 mL | Freq: Two times a day (BID) | OROMUCOSAL | Status: DC
  Administered 2012-04-09 – 2012-04-12 (×7): 15 mL via OROMUCOSAL

## 2012-04-09 NOTE — Progress Notes (Addendum)
SUBJECTIVE:  No chest pain.  No SOB   PHYSICAL EXAM Filed Vitals:   04/09/12 0600 04/09/12 0700 04/09/12 0800 04/09/12 0905  BP: 132/69 130/68 132/74   Pulse: 70 72 68   Temp:    98.9 F (37.2 C)  TempSrc:    Oral  Resp: 13 9 15    Height:      Weight: 140 lb 14 oz (63.9 kg)     SpO2: 99% 98% 99%    General:  No distress Lungs:  Clear Heart:  RRR Abdomen:  Positive bowel sounds, no rebound no guarding Extremities:  No edema  LABS: Lab Results  Component Value Date   CKTOTAL 53 04/09/2012   CKMB 1.4 04/09/2012   TROPONINI <0.30 04/09/2012   Results for orders placed during the hospital encounter of 04/08/12 (from the past 24 hour(s))  CBC WITH DIFFERENTIAL     Status: Abnormal   Collection Time   04/08/12  7:47 PM      Component Value Range   WBC 13.5 (*) 4.0 - 10.5 K/uL   RBC 4.10  3.87 - 5.11 MIL/uL   Hemoglobin 12.3  12.0 - 15.0 g/dL   HCT 16.1  09.6 - 04.5 %   MCV 90.2  78.0 - 100.0 fL   MCH 30.0  26.0 - 34.0 pg   MCHC 33.2  30.0 - 36.0 g/dL   RDW 40.9  81.1 - 91.4 %   Platelets 311  150 - 400 K/uL   Neutrophils Relative 64  43 - 77 %   Neutro Abs 8.6 (*) 1.7 - 7.7 K/uL   Lymphocytes Relative 26  12 - 46 %   Lymphs Abs 3.6  0.7 - 4.0 K/uL   Monocytes Relative 8  3 - 12 %   Monocytes Absolute 1.1 (*) 0.1 - 1.0 K/uL   Eosinophils Relative 2  0 - 5 %   Eosinophils Absolute 0.2  0.0 - 0.7 K/uL   Basophils Relative 0  0 - 1 %   Basophils Absolute 0.0  0.0 - 0.1 K/uL  COMPREHENSIVE METABOLIC PANEL     Status: Abnormal   Collection Time   04/08/12  7:47 PM      Component Value Range   Sodium 143  135 - 145 mEq/L   Potassium 3.5  3.5 - 5.1 mEq/L   Chloride 105  96 - 112 mEq/L   CO2 29  19 - 32 mEq/L   Glucose, Bld 151 (*) 70 - 99 mg/dL   BUN 11  6 - 23 mg/dL   Creatinine, Ser 7.82  0.50 - 1.10 mg/dL   Calcium 8.9  8.4 - 95.6 mg/dL   Total Protein 6.5  6.0 - 8.3 g/dL   Albumin 3.2 (*) 3.5 - 5.2 g/dL   AST 39 (*) 0 - 37 U/L   ALT 56 (*) 0 - 35 U/L   Alkaline  Phosphatase 83  39 - 117 U/L   Total Bilirubin 0.1 (*) 0.3 - 1.2 mg/dL   GFR calc non Af Amer 66 (*) >90 mL/min   GFR calc Af Amer 76 (*) >90 mL/min  PRO B NATRIURETIC PEPTIDE     Status: Abnormal   Collection Time   04/08/12  7:47 PM      Component Value Range   Pro B Natriuretic peptide (BNP) 173.4 (*) 0 - 125 pg/mL  TROPONIN I     Status: Normal   Collection Time   04/08/12  7:47 PM  Component Value Range   Troponin I <0.30  <0.30 ng/mL  MAGNESIUM     Status: Normal   Collection Time   04/08/12  7:47 PM      Component Value Range   Magnesium 2.0  1.5 - 2.5 mg/dL  URINALYSIS, ROUTINE W REFLEX MICROSCOPIC     Status: Normal   Collection Time   04/08/12  8:11 PM      Component Value Range   Color, Urine YELLOW  YELLOW   APPearance CLEAR  CLEAR   Specific Gravity, Urine 1.007  1.005 - 1.030   pH 7.5  5.0 - 8.0   Glucose, UA NEGATIVE  NEGATIVE mg/dL   Hgb urine dipstick NEGATIVE  NEGATIVE   Bilirubin Urine NEGATIVE  NEGATIVE   Ketones, ur NEGATIVE  NEGATIVE mg/dL   Protein, ur NEGATIVE  NEGATIVE mg/dL   Urobilinogen, UA 0.2  0.0 - 1.0 mg/dL   Nitrite NEGATIVE  NEGATIVE   Leukocytes, UA NEGATIVE  NEGATIVE  MRSA PCR SCREENING     Status: Normal   Collection Time   04/08/12  9:52 PM      Component Value Range   MRSA by PCR NEGATIVE  NEGATIVE  CARDIAC PANEL(CRET KIN+CKTOT+MB+TROPI)     Status: Normal   Collection Time   04/08/12 10:30 PM      Component Value Range   Total CK 64  7 - 177 U/L   CK, MB 1.4  0.3 - 4.0 ng/mL   Troponin I <0.30  <0.30 ng/mL   Relative Index RELATIVE INDEX IS INVALID  0.0 - 2.5  PROTIME-INR     Status: Normal   Collection Time   04/08/12 10:30 PM      Component Value Range   Prothrombin Time 13.6  11.6 - 15.2 seconds   INR 1.02  0.00 - 1.49  CBC     Status: Abnormal   Collection Time   04/08/12 10:30 PM      Component Value Range   WBC 13.9 (*) 4.0 - 10.5 K/uL   RBC 4.58  3.87 - 5.11 MIL/uL   Hemoglobin 14.0  12.0 - 15.0 g/dL   HCT 96.0  45.4 -  09.8 %   MCV 89.3  78.0 - 100.0 fL   MCH 30.6  26.0 - 34.0 pg   MCHC 34.2  30.0 - 36.0 g/dL   RDW 11.9  14.7 - 82.9 %   Platelets 268  150 - 400 K/uL  CREATININE, SERUM     Status: Normal   Collection Time   04/08/12 10:30 PM      Component Value Range   Creatinine, Ser 0.77  0.50 - 1.10 mg/dL   GFR calc non Af Amer >90  >90 mL/min   GFR calc Af Amer >90  >90 mL/min  CARDIAC PANEL(CRET KIN+CKTOT+MB+TROPI)     Status: Normal   Collection Time   04/09/12  4:16 AM      Component Value Range   Total CK 53  7 - 177 U/L   CK, MB 1.4  0.3 - 4.0 ng/mL   Troponin I <0.30  <0.30 ng/mL   Relative Index RELATIVE INDEX IS INVALID  0.0 - 2.5    Intake/Output Summary (Last 24 hours) at 04/09/12 0914 Last data filed at 04/09/12 0700  Gross per 24 hour  Intake 852.64 ml  Output   3390 ml  Net -2537.36 ml    EKG:  NSR with LBBB 04/09/2012  ASSESSMENT AND PLAN:  CHB (complete heart  block)/Syncope:  Negaitve enzymes.    Cardiomoypathy:  Non ischemic.  EF 45%  Pro BNP 151.  She seems to be euvolemic.  At this point, no change in therapy is indicated.  Currently off of lisinopril and Coreg    Rollene Rotunda 04/09/2012 9:14 AM

## 2012-04-09 NOTE — ED Provider Notes (Signed)
I saw and evaluated the patient, reviewed the resident's note and I agree with the findings and plan.   Dione Booze, MD 04/09/12 [phone]

## 2012-04-10 DIAGNOSIS — I442 Atrioventricular block, complete: Principal | ICD-10-CM

## 2012-04-10 MED ORDER — WHITE PETROLATUM GEL
Status: AC
  Filled 2012-04-10: qty 5

## 2012-04-10 MED ORDER — SODIUM CHLORIDE 0.9 % IV SOLN
INTRAVENOUS | Status: DC
  Administered 2012-04-10: 04:00:00 via INTRAVENOUS

## 2012-04-10 MED ORDER — LISINOPRIL 2.5 MG PO TABS
2.5000 mg | ORAL_TABLET | Freq: Every day | ORAL | Status: DC
  Administered 2012-04-10 – 2012-04-11 (×2): 2.5 mg via ORAL
  Filled 2012-04-10 (×2): qty 1

## 2012-04-10 NOTE — Progress Notes (Signed)
    SUBJECTIVE:  Chest sore from CPR.  No SOB   PHYSICAL EXAM Filed Vitals:   04/10/12 0500 04/10/12 0600 04/10/12 0700 04/10/12 0800  BP: 156/82 128/70 116/64 133/80  Pulse: 72 66 64 71  Temp:    97.5 F (36.4 C)  TempSrc:    Oral  Resp: 12 14 11 14   Height:      Weight: 140 lb 14 oz (63.9 kg)     SpO2: 96% 94% 92% 95%   General:  No distress Lungs:  Clear Heart:  RRR Abdomen:  Positive bowel sounds, no rebound no guarding Extremities:  No edema  LABS: Lab Results  Component Value Date   CKTOTAL 53 04/09/2012   CKMB 1.4 04/09/2012   TROPONINI <0.30 04/09/2012   No results found for this or any previous visit (from the past 24 hour(s)).  Intake/Output Summary (Last 24 hours) at 04/10/12 0901 Last data filed at 04/10/12 0800  Gross per 24 hour  Intake   1370 ml  Output   2950 ml  Net  -1580 ml    EKG:  NSR with LBBB 04/10/2012  ASSESSMENT AND PLAN:  CHB (complete heart block)/Syncope:  Negaitve enzymes.  I talked to Dr. Ladona Ridgel about this.  I will put her on the board for a pacemaker and make her NPO after midnight.  Cardiomoypathy:  Non ischemic.  EF 45% Euvolemic.  No change in therapy is indicated.  Currently off Coreg.  I will restart a low dose of her lisinopril.    Fayrene Fearing [facility] 04/10/2012 9:01 AM

## 2012-04-11 ENCOUNTER — Encounter (HOSPITAL_COMMUNITY)
Admission: EM | Disposition: A | Discharge status: Home / Self Care | Payer: Self-pay | Source: Home / Self Care | Attending: Internal Medicine

## 2012-04-11 DIAGNOSIS — I442 Atrioventricular block, complete: Secondary | ICD-10-CM

## 2012-04-11 DIAGNOSIS — I509 Heart failure, unspecified: Secondary | ICD-10-CM

## 2012-04-11 HISTORY — PX: BI-VENTRICULAR PACEMAKER INSERTION: SHX5462

## 2012-04-11 SURGERY — BI-VENTRICULAR PACEMAKER INSERTION (CRT-P)
Anesthesia: LOCAL

## 2012-04-11 MED ORDER — CEFAZOLIN SODIUM 1-5 GM-% IV SOLN
1.0000 g | Freq: Four times a day (QID) | INTRAVENOUS | Status: AC
  Administered 2012-04-11 – 2012-04-12 (×3): 1 g via INTRAVENOUS
  Filled 2012-04-11 (×3): qty 50

## 2012-04-11 MED ORDER — ACETAMINOPHEN 325 MG PO TABS
325.0000 mg | ORAL_TABLET | ORAL | Status: DC | PRN
  Administered 2012-04-11 – 2012-04-13 (×4): 650 mg via ORAL
  Filled 2012-04-11 (×5): qty 2

## 2012-04-11 MED ORDER — CEFAZOLIN SODIUM-DEXTROSE 2-3 GM-% IV SOLR
2.0000 g | INTRAVENOUS | Status: DC
  Filled 2012-04-11: qty 50

## 2012-04-11 MED ORDER — MIDAZOLAM HCL 5 MG/5ML IJ SOLN
INTRAMUSCULAR | Status: AC
  Filled 2012-04-11: qty 5

## 2012-04-11 MED ORDER — CARVEDILOL 12.5 MG PO TABS
12.5000 mg | ORAL_TABLET | Freq: Two times a day (BID) | ORAL | Status: DC
  Administered 2012-04-11 – 2012-04-13 (×3): 12.5 mg via ORAL
  Filled 2012-04-11 (×6): qty 1

## 2012-04-11 MED ORDER — OXYCODONE-ACETAMINOPHEN 5-325 MG PO TABS
1.0000 | ORAL_TABLET | ORAL | Status: DC | PRN
  Administered 2012-04-11 – 2012-04-12 (×3): 1 via ORAL
  Administered 2012-04-12: 2 via ORAL
  Administered 2012-04-12: 1 via ORAL
  Administered 2012-04-13: 01:00:00 2 via ORAL
  Filled 2012-04-11 (×3): qty 1
  Filled 2012-04-11: qty 2
  Filled 2012-04-11: qty 1
  Filled 2012-04-11: qty 2

## 2012-04-11 MED ORDER — ONDANSETRON HCL 4 MG/2ML IJ SOLN: 4.0000 mg | Freq: Four times a day (QID) | INTRAMUSCULAR | Status: DC | PRN

## 2012-04-11 MED ORDER — HEPARIN (PORCINE) IN NACL 2-0.9 UNIT/ML-% IJ SOLN
INTRAMUSCULAR | Status: AC
  Filled 2012-04-11: qty 1000

## 2012-04-11 MED ORDER — SODIUM CHLORIDE 0.9 % IJ SOLN: 3.0000 mL | INTRAMUSCULAR | Status: DC | PRN

## 2012-04-11 MED ORDER — SODIUM CHLORIDE 0.9 % IJ SOLN
3.0000 mL | Freq: Two times a day (BID) | INTRAMUSCULAR | Status: DC
  Administered 2012-04-11: 3 mL via INTRAVENOUS

## 2012-04-11 MED ORDER — SODIUM CHLORIDE 0.45 % IV SOLN
INTRAVENOUS | Status: DC
  Administered 2012-04-11: 09:00:00 via INTRAVENOUS

## 2012-04-11 MED ORDER — RISPERIDONE 1 MG PO TABS
1.0000 mg | ORAL_TABLET | Freq: Two times a day (BID) | ORAL | Status: DC
  Administered 2012-04-11 – 2012-04-13 (×4): 1 mg via ORAL
  Filled 2012-04-11 (×6): qty 1

## 2012-04-11 MED ORDER — FENTANYL CITRATE 0.05 MG/ML IJ SOLN
INTRAMUSCULAR | Status: AC
  Filled 2012-04-11: qty 2

## 2012-04-11 MED ORDER — LIDOCAINE HCL (PF) 1 % IJ SOLN
INTRAMUSCULAR | Status: AC
  Filled 2012-04-11: qty 60

## 2012-04-11 MED ORDER — SODIUM CHLORIDE 0.9 % IV SOLN
250.0000 mL | INTRAVENOUS | Status: DC
  Administered 2012-04-11: 250 mL via INTRAVENOUS

## 2012-04-11 MED ORDER — LISINOPRIL 5 MG PO TABS
5.0000 mg | ORAL_TABLET | Freq: Two times a day (BID) | ORAL | Status: DC
  Administered 2012-04-11 – 2012-04-13 (×4): 5 mg via ORAL
  Filled 2012-04-11 (×6): qty 1

## 2012-04-11 MED ORDER — CHLORHEXIDINE GLUCONATE 4 % EX LIQD
60.0000 mL | Freq: Once | CUTANEOUS | Status: AC
  Administered 2012-04-11: 4 via TOPICAL
  Filled 2012-04-11: qty 60

## 2012-04-11 MED ORDER — SODIUM CHLORIDE 0.9 % IV SOLN
INTRAVENOUS | Status: AC
  Administered 2012-04-11: 18:00:00 via INTRAVENOUS

## 2012-04-11 MED ORDER — CHLORHEXIDINE GLUCONATE 4 % EX LIQD
60.0000 mL | Freq: Once | CUTANEOUS | Status: AC
  Administered 2012-04-11: 4 via TOPICAL

## 2012-04-11 MED ORDER — SODIUM CHLORIDE 0.9 % IR SOLN
80.0000 mg | Status: DC
  Filled 2012-04-11: qty 2

## 2012-04-11 MED ORDER — CARVEDILOL 12.5 MG PO TABS: 12.5000 mg | ORAL_TABLET | Freq: Two times a day (BID) | ORAL | Status: DC

## 2012-04-11 NOTE — Consult Note (Signed)
ELECTROPHYSIOLOGY CONSULT NOTE  Patient ID: Nancy Drake, MRN: [id number], DOB/AGE: 1961-02-23 51 y.o. Admit date: 04/08/2012 Date of Consult: 04/11/2012  Primary Physician: Willow Ora, MD Primary Cardiologist: DM  Chief Complaint:  Passing out   HPI Nancy Drake is a 51 y.o. female with history of recurrent syncope dating back to Jan 12 at which time she was seen by Dr. DM and a diagnosis of cardiomyopathy, subsequently nonischemic by catheterization, was made. Left bundle branch block was noted at that time.  It was thereafter that she was started on her guideline directed medical therapy including ACE inhibitors and beta blockers. She has had no limitations of shortness of breath.  She has had 2 recent recurrent episodes of syncope.  The first occurred a couple of weeks ago following drinking of some orange juice. The second occurred on Friday after she gone and eaten chocolate chip cookie. On arrival of EMS she was found to be in complete heart block. This has resolved. She is treated transiently with dopamine. A temporary pacemaker was placed. She has had no recurrent syncope.  As noted her exercise tolerance is quite good.   Past Medical History  Diagnosis Date  . Anxiety   . 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 Cardiomyopathy   . Syncope     while walking on treadmill  . LBBB (left bundle branch block)     SEEMS TO BE CHRONIC      Surgical History:  Past Surgical History  Procedure Date  . Cesarean section      Home Meds: Prior to Admission medications   Medication Sig Start Date End Date Taking? Authorizing Provider  ALPRAZolam Prudy Feeler) 1 MG tablet Take 0.5-1 mg by mouth 4 (four) times daily as needed. For anxiety   Yes Historical Provider, MD  carvedilol (COREG) 12.5 MG tablet Take 12.5 mg by mouth 2 (two) times daily with a meal.   Yes Historical Provider, MD  lisinopril (PRINIVIL,ZESTRIL) 5 MG tablet Take 5 mg by mouth 2 (two) times daily.   Yes  Historical Provider, MD  Multiple Vitamin (MULTIVITAMIN WITH MINERALS) TABS Take 1 tablet by mouth daily.   Yes Historical Provider, MD  risperiDONE (RISPERDAL) 0.5 MG tablet Take 1 mg by mouth 2 (two) times daily.   Yes Historical Provider, MD    Inpatient Medications:    . antiseptic oral rinse  15 mL Mouth Rinse BID  . DOPamine  2-20 mcg/kg/min Intravenous Once  . enoxaparin  40 mg Subcutaneous Q24H  . lisinopril  2.5 mg Oral Daily  . risperiDONE  0.5 mg Oral BID  . sodium chloride  3 mL Intravenous Q12H  . white petrolatum        Allergies:  Allergies  Allergen Reactions  . Desloratadine     Could not sleep.     History   Social History  . Marital Status: Divorced    Spouse Name: N/A    Number of Children: 2  . 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 Smoking status: Never Smoker   . Smokeless tobacco: Never Used  . Alcohol Use: No     socially  . Drug Use: No  . Sexually Active: Yes    Birth Control/ Protection: Post-menopausal   Other Topics Concern  . Not on file   Social History Narrative   BORN IN [place]     Family History  Problem  Relation Age of Onset  . Other Other     uncle with pacemaker  . Heart disease Other     PACERMAKER     ROS:  Please see the history of present illness.     All other systems reviewed and negative.    Physical Exam: Blood pressure 128/72, pulse 76, temperature 98.5 F (36.9 C), temperature source Oral, resp. rate 17, height 5\' 1"  (1.549 m), weight 134 lb 0.6 oz (60.8 kg), last menstrual period 02/21/2012, SpO2 95.00%. General: Well developed, well nourished female in no acute distress. Head: Normocephalic, atraumatic, sclera non-icteric, no xanthomas, nares are without discharge. Lymph Nodes:  none Back: not examined secondary for pacemaker  Neck: Negative for carotid bruits. JVD not elevated. Lungs: Clear bilaterally to auscultation without wheezes, rales, or  rhonchi examined only laterally as above. Breathing is unlabored. Heart: RRR with S1 S2. No murmur , rubs, or gallops appreciated. Abdomen: Soft, non-tender, non-distended with normoactive bowel sounds. No hepatomegaly. No rebound/guarding. No obvious abdominal masses. Msk:  Strength and tone appear normal for age. Extremities: No clubbing or cyanosis. No edema.  Distal pedal pulses are 2+ and equal bilaterally. Skin: Warm and Dry Neuro: Alert and oriented X 3. CN III-XII intact Grossly normal sensory and motor function . Psych:  Responds to questions appropriately with a normal affect.      Labs: Cardiac Enzymes  Basename 04/09/12 0416 04/08/12 2230 04/08/12 1947  CKTOTAL 53 64 --  CKMB 1.4 1.4 --  TROPONINI <0.30 <0.30 <0.30   CBC Lab Results  Component Value Date   WBC 13.9* 04/08/2012   HGB 14.0 04/08/2012   HCT 40.9 04/08/2012   MCV 89.3 04/08/2012   PLT 268 04/08/2012   PROTIME:  Basename 04/08/12 2230  LABPROT 13.6  INR 1.02   Chemistry  Lab 04/08/12 2230 04/08/12 1947  NA -- 143  K -- 3.5  CL -- 105  CO2 -- 29  BUN -- 11  CREATININE 0.77 --  CALCIUM -- 8.9  PROT -- 6.5  BILITOT -- 0.1*  ALKPHOS -- 83  ALT -- 56*  AST -- 39*  GLUCOSE -- 151*   Lipids No results found for this basename: CHOL, HDL, LDLCALC, TRIG   BNP Pro B Natriuretic peptide (BNP)  Date/Time Value Range Status  04/08/2012  7:47 PM 173.4* 0 - 125 pg/mL Final  10/13/2010  1:43 PM 28.8  0.0 - 100.0 pg/mL Final   Miscellaneous No results found for this basename: DDIMER    Radiology/Studies:  Dg Chest Portable 1 View  04/08/2012  *RADIOLOGY REPORT*  Clinical Data: Weakness, bradycardia  PORTABLE CHEST - 1 VIEW  Comparison: Chest radiograph 01/21/2006  Findings: Normal cardiac silhouette.  No effusion, infiltrate, or pneumothorax. No osseous abnormality.  IMPRESSION: No acute cardiopulmonary process.  Original Report Authenticated By: Genevive Bi, M.D.    EKG:  sinus rhythm with left bundle  branch block EKG also demonstrates complete heart block with right bundle branch block escape    Patient Active Hospital Problem List: CHB (complete heart block) (04/08/2012)   ANXIETY (10/17/2006)  * SYNCOPE (09/17/2010)      the patient has had Adam-stokes attacks with intermittent complete heart block the context of left bundle branch block. Importantly, these have occurred prior to the initiation of her beta blockers as syncope with a presenting complaint at which time her cardiomyopathy was identified  She has minimal symptoms related to  heart failure she will need pacing likely progressively and with her cardiomyopathy,  it is appropriate to consider CRT P. if possible. I have reviewed this extensively with her.  Following pacing we will reinitiate her beta blockers. Her symptoms at this point not justify aldosterone antagonism therapy.   The benefits and risks were reviewed including but not limited to death,  perforation, infection, lead dislodgement and device malfunction.  The patient understands agrees and is willing to proceed.

## 2012-04-11 NOTE — CV Procedure (Signed)
Nancy Drake [id number]  [id number]  Preop Dx: intermittent complete heart block; LBBB and cardiomyopathy EF 40-45% Postop Dx same/  Procedure:Dual chamber pacer with LV lead placement  Cx: None  Dictation number [id number]  Sherryl Manges, MD 04/11/2012 5:13 PM

## 2012-04-11 NOTE — Care Management Note (Signed)
    Page 1 of 1   04/11/2012     9:53:32 AM   CARE MANAGEMENT NOTE 04/11/2012  Patient:  Memmer,Jailani   Account Number:  [id number]  Date Initiated:  04/11/2012  Documentation initiated by:  Junius Creamer  Subjective/Objective Assessment:   adm w complete heart block     Action/Plan:   lives w fam, pcp dr Elita Quick paz   Anticipated DC Date:     Anticipated DC Plan:        DC Planning Services  CM consult      Choice offered to / List presented to:             Status of service:   Medicare Important Message given?   (If response is "NO", the following Medicare IM given date fields will be blank) Date Medicare IM given:   Date Additional Medicare IM given:    Discharge Disposition:    Per UR Regulation:  Reviewed for med. necessity/level of care/duration of stay  If discussed at Long Length of Stay Meetings, dates discussed:    Comments:  8/12 9:52a debbie Sovereign Ramiro rn,bsn [phone]

## 2012-04-11 NOTE — Progress Notes (Signed)
Right fem venous sheath pulled. Pressure held for 10 mins with hemostasis achieved. Pt tolerated well. Gauze dressing applied.

## 2012-04-12 ENCOUNTER — Encounter: Payer: Self-pay | Admitting: *Deleted

## 2012-04-12 ENCOUNTER — Inpatient Hospital Stay (HOSPITAL_COMMUNITY): Payer: BC Managed Care – PPO

## 2012-04-12 ENCOUNTER — Encounter (HOSPITAL_COMMUNITY)
Admission: EM | Disposition: A | Discharge status: Home / Self Care | Payer: Self-pay | Source: Home / Self Care | Attending: Internal Medicine

## 2012-04-12 DIAGNOSIS — Z950 Presence of cardiac pacemaker: Secondary | ICD-10-CM | POA: Insufficient documentation

## 2012-04-12 DIAGNOSIS — T82190A Other mechanical complication of cardiac electrode, initial encounter: Secondary | ICD-10-CM

## 2012-04-12 HISTORY — PX: LEAD REVISION: SHX5945

## 2012-04-12 SURGERY — LEAD REVISION
Anesthesia: LOCAL

## 2012-04-12 MED ORDER — CHLORHEXIDINE GLUCONATE 4 % EX LIQD: 60.0000 mL | Freq: Once | CUTANEOUS | Status: DC

## 2012-04-12 MED ORDER — ONDANSETRON HCL 4 MG/2ML IJ SOLN: 4.0000 mg | Freq: Four times a day (QID) | INTRAMUSCULAR | Status: DC | PRN

## 2012-04-12 MED ORDER — CEFAZOLIN SODIUM 1-5 GM-% IV SOLN
1.0000 g | Freq: Four times a day (QID) | INTRAVENOUS | Status: AC
  Administered 2012-04-12 – 2012-04-13 (×3): 1 g via INTRAVENOUS
  Filled 2012-04-12 (×3): qty 50

## 2012-04-12 MED ORDER — ZOLPIDEM TARTRATE 5 MG PO TABS
5.0000 mg | ORAL_TABLET | Freq: Every evening | ORAL | Status: DC | PRN
  Administered 2012-04-12 (×2): 5 mg via ORAL
  Filled 2012-04-12 (×2): qty 1

## 2012-04-12 MED ORDER — MIDAZOLAM HCL 5 MG/5ML IJ SOLN
INTRAMUSCULAR | Status: AC
  Filled 2012-04-12: qty 5

## 2012-04-12 MED ORDER — LIDOCAINE HCL (PF) 1 % IJ SOLN
INTRAMUSCULAR | Status: AC
  Filled 2012-04-12: qty 60

## 2012-04-12 MED ORDER — SODIUM CHLORIDE 0.9 % IR SOLN
80.0000 mg | Status: DC
  Filled 2012-04-12: qty 2

## 2012-04-12 MED ORDER — SODIUM CHLORIDE 0.9 % IJ SOLN: 3.0000 mL | Freq: Two times a day (BID) | INTRAMUSCULAR | Status: DC

## 2012-04-12 MED ORDER — SODIUM CHLORIDE 0.9 % IV SOLN: 250.0000 mL | INTRAVENOUS | Status: DC

## 2012-04-12 MED ORDER — ACETAMINOPHEN 325 MG PO TABS: 325.0000 mg | ORAL_TABLET | ORAL | Status: DC | PRN

## 2012-04-12 MED ORDER — HEPARIN (PORCINE) IN NACL 2-0.9 UNIT/ML-% IJ SOLN
INTRAMUSCULAR | Status: AC
  Filled 2012-04-12: qty 1000

## 2012-04-12 MED ORDER — CEFAZOLIN SODIUM-DEXTROSE 2-3 GM-% IV SOLR
2.0000 g | INTRAVENOUS | Status: DC
  Filled 2012-04-12: qty 50

## 2012-04-12 MED ORDER — FENTANYL CITRATE 0.05 MG/ML IJ SOLN
INTRAMUSCULAR | Status: AC
  Filled 2012-04-12: qty 2

## 2012-04-12 MED ORDER — SODIUM CHLORIDE 0.9 % IJ SOLN: 3.0000 mL | INTRAMUSCULAR | Status: DC | PRN

## 2012-04-12 NOTE — Op Note (Signed)
[name], Drake          ACCOUNT NO.:  [id number]  MEDICAL RECORD NO.:  [id number]  LOCATION:  2914                         FACILITY:  [facility]  PHYSICIAN:  Duke Salvia, MD, FACCDATE OF BIRTH:  1961/01/27  DATE OF PROCEDURE:  04/11/2012 DATE OF DISCHARGE:                              OPERATIVE REPORT   PREOPERATIVE DIAGNOSES:  Adams-Stokes attacks with underlying left bundle-branch block and cardiomyopathy.  POSTOPERATIVE DIAGNOSES:  Adams-Stokes attacks with underlying left bundle-branch block and cardiomyopathy.  PROCEDURE:  Dual-chamber pacemaker implantation with left ventricular lead placement.  Following the obtaining informed consent, the patient was brought to Electrophysiology Laboratory and placed on the fluoroscopic table in supine position.  After routine prep and drape of the left upper chest, lidocaine was infiltrated in the prepectoral subclavicular region.  An incision was made and carried down to the layer of the prepectoral fascia using electrocautery and sharp dissection.  A pocket was performed similarly.  Hemostasis was obtained.  Thereafter, attention was turned to gain access to the extrathoracic left subclavian vein, which was accomplished with mild difficulty, but without the aspiration of air or puncture of the artery.  Three separate venipunctures were accomplished.  Guidewires were placed and retained and sequentially a 7, 9.5, and 7-French sheath were placed, which were passed a Medtronic 5076 52-cm active fixation ventricular lead, serial number [id number] and Medtronic MB2 coronary sinus cannulation system and a Medtronic 5076 45-cm active fixation atrial lead, serial number [id number].  Under fluoroscopic guidance, the ventricular lead was manipulated to the apex where the bipolar R-wave was 7.2 with a pacing impedance of 894, threshold of 0.9 at 0.4.  Current of injury was brisk and there was no diaphragmatic pacing at 10 volts.  It  was interesting that this R-wave actually started much higher and with the current of injury depleting the R-wave went down, but the peak to peak with still about 8 millivolts.  This lead was marked with the tie and secured to the prepectoral fascia.  The coronary sinus was cannulated with little bit difficulty.  A coronary sinus venogram demonstrated a high lateral branch, which was targeted and a [employer]. Jude 1250AT 86-cm lead, serial number [id number] lead was deployed at a midposition between the mid and distal third.  R-waves were about 21 millivolts.  Impedance was 698, threshold was 0.7 at 0.5 and there was no diaphragmatic pacing at 10 volts.  The 9.5-French sheath was removed and the atrial lead was then deployed.  It was deployed to the right atrial appendage where the bipolar P-wave was 8.2 with a pacing impedance of 905, a threshold of 1.4 at 0.5, current of threshold was 1.4 mA and there was no diaphragmatic pacing at 10 volts. This lead was secured to the prepectoral fascia and then the deploying coronary sinus sheath was removed under fluoroscopy.  This lead was then secured to the prepectoral fascia as well.  The leads were then attached to a Medtronic C4TR01 pulse generator, serial number [id number].  P- synchronous pacing was identified.  The pocket was copiously irrigated with antibiotic-containing saline solution.  Surgicel was placed along the posterior wall of the pocket were I had inadvertently violated the prepectoral fascia as well as  in the cephalad groove.  The leads and the pulse generator were placed secured to the prepectoral fascia and the wound was then closed in 3 layers in normal fashion.  The wound was washed, dried, and a benzoin and Steri-Strip dressing was applied.  Needle counts, sponge counts, and instrument counts were correct at the end of the procedure according to the staff.  The patient tolerated the procedure without apparent  complication.     Duke Salvia, MD, [facility]     SCK/MEDQ  D:  04/11/2012  T:  04/12/2012  Job:  [id number]

## 2012-04-12 NOTE — Op Note (Signed)
RV lead revision without immediate complication. Z#[id number].

## 2012-04-12 NOTE — Interval H&P Note (Signed)
History and Physical Interval Note: I have discussed the indications, risks,benefits,goals and expectations of PPM lead revision and she wishes to proceed.  04/12/2012 4:35 PM  Nancy Drake  has presented today for surgery, with the diagnosis of bad lead  The various methods of treatment have been discussed with the patient and family. After consideration of risks, benefits and other options for treatment, the patient has consented to  Procedure(s) (LRB): LEAD REVISION (N/A) as a surgical intervention .  The patient's history has been reviewed, patient examined, no change in status, stable for surgery.  I have reviewed the patient's chart and labs.  Questions were answered to the patient's satisfaction.     Lewayne Bunting

## 2012-04-12 NOTE — Progress Notes (Signed)
Orthopedic Tech Progress Note Patient Details:  Nancy Drake Oct 21, 1960 [id number]  Patient ID: Nancy Drake, female   DOB: [birth year], 51 y.o.   MRN: [id number] Confirmed pt has arm sling.  Adylynn Hertenstein T 04/12/2012, 1:34 PM

## 2012-04-12 NOTE — Progress Notes (Signed)
   ELECTROPHYSIOLOGY ROUNDING NOTE    Patient Name: Nancy Drake Date of Encounter: 04-12-2012    SUBJECTIVE: Patient feels well.  No chest pain or shortness of breath.  Status post CRT-P implantation 04-11-2012.   TELEMETRY: Reviewed telemetry pt in sinus rhythm with ventricular pacing Filed Vitals:   04/12/12 0200 04/12/12 0300 04/12/12 0400 04/12/12 0500  BP: 119/70 99/61 113/74 121/72  Pulse: 76 75 71 75  Temp:   98.2 F (36.8 C)   TempSrc:   Oral   Resp: 18 17 18 18   Height:      Weight:      SpO2: 95% 91% 95% 94%    Intake/Output Summary (Last 24 hours) at 04/12/12 1610 Last data filed at 04/12/12 0500  Gross per 24 hour  Intake  712.5 ml  Output   2050 ml  Net -1337.5 ml   Radiology/Studies:  Final result pending, RV lead pulled back  EKG: pending  PHYSICAL EXAM Left chest without hematoma or ecchymosis.  Well developed and nourished in no acute distress HENT normal Neck supple with JVP-flat Clear Regular rate and rhythm, no murmurs or gallops No Clubbing cyanosis edema Skin-warm and dry A & Oriented  Grossly normal sensory and motor function   DEVICE INTERROGATION: Device interrogation  Stable   Wound care, arm mobility, restrictions reviewed with patient.  Routine follow up scheduled.  Assessment/Plan   HTN- well controlled CHB intermittent>> s/p pacer Pacer with  RV lead "pullback"  Will need revision  Orders written

## 2012-04-12 NOTE — H&P (View-Only) (Signed)
   ELECTROPHYSIOLOGY ROUNDING NOTE    Patient Name: Nancy Drake Date of Encounter: 04-12-2012    SUBJECTIVE: Patient feels well.  No chest pain or shortness of breath.  Status post CRT-P implantation 04-11-2012.   TELEMETRY: Reviewed telemetry pt in sinus rhythm with ventricular pacing Filed Vitals:   04/12/12 0200 04/12/12 0300 04/12/12 0400 04/12/12 0500  BP: 119/70 99/61 113/74 121/72  Pulse: 76 75 71 75  Temp:   98.2 F (36.8 C)   TempSrc:   Oral   Resp: 18 17 18 18  Height:      Weight:      SpO2: 95% 91% 95% 94%    Intake/Output Summary (Last 24 hours) at 04/12/12 0637 Last data filed at 04/12/12 0500  Gross per 24 hour  Intake  712.5 ml  Output   2050 ml  Net -1337.5 ml   Radiology/Studies:  Final result pending, RV lead pulled back  EKG: pending  PHYSICAL EXAM Left chest without hematoma or ecchymosis.  Well developed and nourished in no acute distress HENT normal Neck supple with JVP-flat Clear Regular rate and rhythm, no murmurs or gallops No Clubbing cyanosis edema Skin-warm and dry A & Oriented  Grossly normal sensory and motor function   DEVICE INTERROGATION: Device interrogation  Stable   Wound care, arm mobility, restrictions reviewed with patient.  Routine follow up scheduled.  Assessment/Plan   HTN- well controlled CHB intermittent>> s/p pacer Pacer with  RV lead "pullback"  Will need revision  Orders written 

## 2012-04-13 ENCOUNTER — Inpatient Hospital Stay (HOSPITAL_COMMUNITY): Payer: BC Managed Care – PPO

## 2012-04-13 LAB — CBC
HCT: 39.3 % (ref 36.0–46.0)
MCHC: 35.1 g/dL (ref 30.0–36.0)
MCV: 87.5 fL (ref 78.0–100.0)
Platelets: 218 10*3/uL (ref 150–400)
RDW: 13 % (ref 11.5–15.5)
WBC: 15.1 10*3/uL — ABNORMAL HIGH (ref 4.0–10.5)

## 2012-04-13 NOTE — Op Note (Signed)
Nancy Drake, Nancy Drake          ACCOUNT NO.:  [id number]  MEDICAL RECORD NO.:  [id number]  LOCATION:                                 FACILITY:  PHYSICIAN:  Doylene Canning. Ladona Ridgel, MD    DATE OF BIRTH:  1960/09/27  DATE OF PROCEDURE:  04/12/2012 DATE OF DISCHARGE:                              OPERATIVE REPORT   PROCEDURE PERFORMED:  Revision of a right ventricular pacing lead.  INDICATION:  Ventricular lead dislodgement.  INTRODUCTION:  The patient is a very pleasant 51 year old woman with a nonischemic cardiomyopathy and chronic systolic heart failure.  She underwent BiV ICD implantation secondary to all of the above, in conjunction with intermittent complete heart block.  She is now referred for revision of her RV lead after her lead had been retracted back and her R-waves were 2.  PROCEDURE:  After informed consent was obtained, the patient was taken to the Diagnostic EP Lab in the fasting state.  After usual preparation and draping, intravenous fentanyl and midazolam was given for sedation. A 30 mL of lidocaine was infiltrated into the left infraclavicular region.  A 5-cm incision was carried out over this region and electrocautery was utilized to dissect down to the fascial plane.  The generator was removed and placed in antibiotic irrigation.  The sutures were cut from the RV lead.  A stylet was advanced into the lead.  The lead was pushed forward so there was plenty of slack, but unfortunately the R-waves remained 2.  The helix of the lead was retracted backwards and the lead was repositioned on the more septal portion of the right ventricle.  In this location, the R-waves measured greater than 30 mV, the pacing impedance was 1000 ohms, the threshold was less than 1 V at 0.5 msec, and there was a large injury current with active fixation lead.  The lead was secured back to the subpectoral fascia with a figure- of-eight silk suture and the sewing sleeve was secured with silk  suture. The pocket was irrigated with antibiotic irrigation.  The recently implanted Bi-V pacemaker was reconnected to the LV, RV, and atrial leads, and placed back in the subcutaneous pocket.  The pocket was irrigated with antibiotic irrigation and incision was closed with 2-0 and 3-0 Vicryl.  Benzoin and Steri-Strips painted on the skin, pressure dressing was applied, the patient was returned to her room in satisfactory condition.  COMPLICATIONS:  There were no immediate procedure complications.  RESULTS:  This demonstrates successful ventricular lead revision in a patient with previous Bi-V pacemaker and intermittent complete heart block in the setting of a nonischemic cardiomyopathy and left bundle- branch block patient.    Doylene Canning. Ladona Ridgel, MD    GWT/MEDQ  D:  04/12/2012  T:  04/13/2012  Job:  [id number]

## 2012-04-13 NOTE — Progress Notes (Signed)
Patient: Naziya Hegwood Date of Encounter: 04/13/2012, 8:17 AM Admit date: 04/08/2012     Subjective  Ms. Bracken reports soreness at implant site. She denies SOB.   Objective  Physical Exam: Vitals: BP 151/85  Pulse 110  Temp 98.1 F (36.7 C) (Oral)  Resp 26  Ht 5\' 1"  (1.549 m)  Wt 131 lb 6.3 oz (59.6 kg)  BMI 24.83 kg/m2  SpO2 99%  LMP 02/21/2012 General: Well developed, well appearing 51 year old female in no acute distress. Neck: Supple. JVD not elevated. Lungs: Clear bilaterally to auscultation without wheezes, rales, or rhonchi. Breathing is unlabored. Heart: RRR S1 S2 without murmurs, rub or gallop.  Abdomen: Soft, non-distended. Extremities: No clubbing or cyanosis. No edema.  Distal pedal pulses are 2+ and equal bilaterally. Neuro: Alert and oriented X 3. Moves all extremities spontaneously. No focal deficits.  Intake/Output: Intake/Output Summary (Last 24 hours) at 04/13/12 0817 Last data filed at 04/13/12 0403  Gross per 24 hour  Intake     50 ml  Output    425 ml  Net   -375 ml   Inpatient Medications:  . antiseptic oral rinse  15 mL Mouth Rinse BID  . carvedilol  12.5 mg Oral BID WC  . ceFAZolin (ANCEF) IV  1 g Intravenous Q6H  . ceFAZolin (ANCEF) IV  1 g Intravenous Q6H  . lisinopril  5 mg Oral BID  . risperiDONE  1 mg Oral BID   Labs: No results found for this basename: NA:2,K:2,CL:2,CO2:2,GLUCOSE:2,BUN:2,CREATININE:2,CALCIUM:2,MG:2,PHOS:2 in the last 72 hours No results found for this basename: AST:2,ALT:2,ALKPHOS:2,BILITOT:2,PROT:2,ALBUMIN:2 in the last 72 hours No results found for this basename: WBC:2,NEUTROABS:2,HGB:2,HCT:2,MCV:2,PLT:2 in the last 72 hours No components found with this basename: POCBNP:3   Radiology/Studies: Dg Chest 2 View  04/13/2012  *RADIOLOGY REPORT*  Clinical Data: Status post pacemaker placement.  CHEST - 2 VIEW  Comparison: Chest x-ray 04/12/2012. Findings: Lungs are well expanded bilaterally, without acute  consolidative airspace disease or definite pneumothorax.  Trace bilateral pleural effusions.  New nodular opacity projecting over the left base laterally where the anterior aspect of the left sixth and posterior aspect of the ninth ribs overlap; given the fact that this is not visualized at all on yesterday's examination, this is strongly favored to represent a prominent nipple shadow superimposed upon the adjacent bony structures. Heart size and mediastinal contours are within normal limits.  The left-sided biventricular pacemaker is in place with lead tips projecting over the expected location of the right atrium, right ventricular apex and lateral wall of the left ventricle (via the coronary sinus and coronary veins). Notably, the right ventricular lead is in a higher position than seen on the prior radiograph.  IMPRESSION: 1. Status post revision placement of a right ventricular lead in this patient with a biventricular pacemaker device.  No pneumothorax. 2.  Trace bilateral pleural effusions. 3.  New nodular opacity projecting over the lower aspect of the left lung, favored to represent the confluence of the left nipple shadow and adjacent bony structures.  Attention on follow-up studies is recommended.   Original Report Authenticated By: Florencia Reasons, M.D.   Telemetry: ?sinus tachy with pacing at the upper tracking rate vs PMT. Will re-interogate   Assessment and Plan  1. CHB with Stokes-Adams attacks, underlying LBBB and CM s/p CRT-P 04/11/2012, s/p RV lead revision 04/12/2012  Dr. Ladona Ridgel to see and make further recommendations Signed, EDMISTEN, BROOKE PA-C  EP Attending  1. S/p PPM RV lead revision  2. Sinus tachy vs PMT 3. DCM 4. Syncope due to intermittent CHB Rec: Will re-interogate PPM and tentatively plan discharge later today.  Lewayne Bunting, M.D.

## 2012-04-13 NOTE — Discharge Summary (Signed)
ELECTROPHYSIOLOGY DISCHARGE SUMMARY    Patient ID: Nancy Drake,  MRN: [id number], DOB/AGE: Aug 05, 1961 51 y.o.  Admit date: 04/08/2012 Discharge date: 04/13/2012  Primary Care Physician: Willow Ora, MD Primary Cardiologist: Marca Ancona, MD  Primary EP: Berton Mount, MD  Primary Discharge Diagnosis:  1. CHB s/p BiV PPM implantation 2. Nonischemic cardiomyopathy, LVEF 30% 3. LBBB  Secondary Discharge Diagnoses:  1. Schizophrenia 2. Anxiety 3. Normal coronaries s/p cardiac cath March 2012  Procedures This Admission: 1. Placement of transvenous temporary pacing wire 04/08/2012 2. BiV PPM implantation 04/11/2012 Medtronic 5076 52-cm active fixation ventricular lead, serial number [id number]. Medtronic 5076 45-cm active fixation atrial lead, serial number [id number]. St. Jude 1250AT 86-cm lead, serial number [id number] LV lead. Medtronic [id number] pulse generator, serial number [id number] S. 3. RV lead revision 04/12/2012  History and Hospital Course:  Nancy Drake is a 51 year old woman with LV dysfunction who was admitted 04/08/2012 for syncope and CHB with rates in the 20s. She underwent temporary transvenous pacing wire placement in the cath lab. Her carvedilol was held. Her CEs were negative. She was seen in consultation by Dr. Berton Mount who recommended BiV PPM in setting of LV dysfunction and LBBB. She underwent BiV PPM implantation on 04/11/2012. She tolerated the procedure well. The following AM, her chest xray showed she had RV lead "pull back" with R wave amplitude of 2 mV. She then underwent RV lead revision 04/12/2012. The patient tolerated this procedure well without any immediate complication. She remains hemodynamically stable and afebrile. Carvedilol was resumed following BiV PPM implant. Her chest xray this AM shows stable lead placement without pneumothorax. Her device interrogation this AM shows normal ICD function with stable lead parameters/measurements. She is in sinus  tachycardia. Her implant site is intact without significant bleeding or hematoma. She is ambulating without difficulty. She has been given discharge instructions including wound care and activity restrictions. She will follow-up in 10 days for wound check. There were no changes made to her medications. She has been seen, examined and deemed stable for discharge today by Dr. Lewayne Bunting.  Discharge Vitals: Blood pressure 118/100, pulse 123, temperature 98.1 F (36.7 C), temperature source Oral, resp. rate 19, height 5\' 1"  (1.549 m), weight 131 lb 6.3 oz (59.6 kg), last menstrual period 02/21/2012, SpO2 99%. Heart rate improved to SR at 90 bpm prior to discharge.  Labs: Lab Results  Component Value Date   WBC 15.1* 04/13/2012   HGB 13.8 04/13/2012   HCT 39.3 04/13/2012   MCV 87.5 04/13/2012   PLT 218 04/13/2012     Lab 04/08/12 2230 04/08/12 1947  NA -- 143  K -- 3.5  CL -- 105  CO2 -- 29  BUN -- 11  CREATININE 0.77 --  CALCIUM -- 8.9  PROT -- 6.5  BILITOT -- 0.1*  ALKPHOS -- 83  ALT -- 56*  AST -- 39*  GLUCOSE -- 151*   Lab Results  Component Value Date   CKTOTAL 53 04/09/2012   CKMB 1.4 04/09/2012   TROPONINI <0.30 04/09/2012    Disposition:  The patient is being discharged in stable condition.  Follow-up: Follow-up Information    Follow up with [facility] on 04/21/2012. (At 3:00 PM for wound check)    Contact information:   [address] [place] [address]       Follow up with Sherryl Manges, MD on 07/19/2012. (At 10:15 AM)    Contact information:   [address] [place] [address] [phone]        Discharge Medications:  Medication List  As of 04/13/2012  1:47 PM   TAKE these medications         ALPRAZolam 1 MG tablet   Commonly known as: XANAX   Take 0.5-1 mg by mouth 4 (four) times daily as needed. For anxiety      carvedilol 12.5 MG tablet   Commonly known as: COREG   Take 12.5  mg by mouth 2 (two) times daily with a meal.      lisinopril 5 MG tablet   Commonly known as: PRINIVIL,ZESTRIL   Take 5 mg by mouth 2 (two) times daily.      multivitamin with minerals Tabs   Take 1 tablet by mouth daily.      risperiDONE 0.5 MG tablet   Commonly known as: RISPERDAL   Take 1 mg by mouth 2 (two) times daily.          Duration of Discharge Encounter: Greater than 30 minutes including physician time.  Signed, Rick Duff, PA-C 04/13/2012, 1:47 PM

## 2012-04-14 ENCOUNTER — Telehealth: Payer: Self-pay | Admitting: Internal Medicine

## 2012-04-14 NOTE — Telephone Encounter (Signed)
She should call cards if this is a surgical issue- it was not present prior to surgery.

## 2012-04-14 NOTE — Telephone Encounter (Signed)
Pt should call Cards and see if they are willing to prescribe this in PCP absence since they inserted Pacemaker

## 2012-04-14 NOTE — Telephone Encounter (Signed)
Spoke to pt to advise results/instructions. Pt understood. Pt will call cardiology back for next steps

## 2012-04-14 NOTE — Telephone Encounter (Signed)
Caller: Saleen/Patient; Patient Name: Nancy Drake; PCP: Willow Ora; Best Callback Phone Number: [phone].  Patient calling today 04/14/12 regarding was just discharged from hospital yesterday 04/13/12 (had a pacemaker put in on 04/12/12) and would like to have Ambien called in to help her sleep (said she took this while in the hospital.  Discharge doctor said she would need to check with her primary care doctor to get a prescription for this.  Advised patient that MD would like to see her for appointment to discuss her recent hospitalization and then discuss the need for Ambien.  Pt declined appointment saying she is on bedrest and not able to come in to office.  Would like Dr. Drue Novel to call in prescription for Ambien to [facility], [address] and [address] [phone]. OFFICE PLEASE CALL PATIENT BACK AT ABOVE CALL BACK NUMBER TO LET HER KNOW IF THIS WILL BE DONE.

## 2012-04-14 NOTE — Telephone Encounter (Signed)
Already on Risperdone and Xanax

## 2012-04-14 NOTE — Telephone Encounter (Signed)
Pt notes that she has taken the medications prescribed however she still did not sleep last night, noted surgery 04-12-12 and was able to sleep the night before, please advise

## 2012-04-18 MED FILL — Calcium Chloride Inj 10%: INTRAVENOUS | Qty: 10 | Status: AC

## 2012-04-19 ENCOUNTER — Encounter (HOSPITAL_COMMUNITY): Payer: Self-pay | Admitting: Emergency Medicine

## 2012-04-19 ENCOUNTER — Emergency Department (HOSPITAL_COMMUNITY)
Admission: EM | Admit: 2012-04-19 | Discharge: 2012-04-22 | Disposition: A | Discharge status: Home / Self Care | Payer: BC Managed Care – PPO | Attending: Emergency Medicine | Admitting: Emergency Medicine

## 2012-04-19 DIAGNOSIS — F411 Generalized anxiety disorder: Secondary | ICD-10-CM | POA: Insufficient documentation

## 2012-04-19 DIAGNOSIS — F29 Unspecified psychosis not due to a substance or known physiological condition: Secondary | ICD-10-CM | POA: Insufficient documentation

## 2012-04-19 DIAGNOSIS — N390 Urinary tract infection, site not specified: Secondary | ICD-10-CM | POA: Insufficient documentation

## 2012-04-19 DIAGNOSIS — Z79899 Other long term (current) drug therapy: Secondary | ICD-10-CM | POA: Insufficient documentation

## 2012-04-19 LAB — CBC WITH DIFFERENTIAL/PLATELET
Basophils Relative: 0 % (ref 0–1)
Eosinophils Absolute: 0 10*3/uL (ref 0.0–0.7)
MCH: 30.2 pg (ref 26.0–34.0)
MCHC: 34.3 g/dL (ref 30.0–36.0)
Neutro Abs: 9.6 10*3/uL — ABNORMAL HIGH (ref 1.7–7.7)
Neutrophils Relative %: 71 % (ref 43–77)
Platelets: 372 10*3/uL (ref 150–400)
RBC: 4.27 MIL/uL (ref 3.87–5.11)

## 2012-04-19 LAB — COMPREHENSIVE METABOLIC PANEL
ALT: 23 U/L (ref 0–35)
AST: 23 U/L (ref 0–37)
Albumin: 4.3 g/dL (ref 3.5–5.2)
Alkaline Phosphatase: 82 U/L (ref 39–117)
Chloride: 101 mEq/L (ref 96–112)
Potassium: 3.7 mEq/L (ref 3.5–5.1)
Sodium: 139 mEq/L (ref 135–145)
Total Protein: 8.2 g/dL (ref 6.0–8.3)

## 2012-04-19 LAB — RAPID URINE DRUG SCREEN, HOSP PERFORMED
Amphetamines: NOT DETECTED
Barbiturates: NOT DETECTED
Benzodiazepines: POSITIVE — AB
Cocaine: NOT DETECTED

## 2012-04-19 LAB — URINALYSIS, ROUTINE W REFLEX MICROSCOPIC
Bilirubin Urine: NEGATIVE
Glucose, UA: NEGATIVE mg/dL
Hgb urine dipstick: NEGATIVE
Ketones, ur: NEGATIVE mg/dL
Leukocytes, UA: NEGATIVE
pH: 6 (ref 5.0–8.0)

## 2012-04-19 MED ORDER — LISINOPRIL 5 MG PO TABS
5.0000 mg | ORAL_TABLET | Freq: Two times a day (BID) | ORAL | Status: DC
  Administered 2012-04-20 – 2012-04-22 (×4): 5 mg via ORAL
  Filled 2012-04-19 (×8): qty 1

## 2012-04-19 MED ORDER — CARVEDILOL 12.5 MG PO TABS
12.5000 mg | ORAL_TABLET | Freq: Two times a day (BID) | ORAL | Status: DC
  Administered 2012-04-20 – 2012-04-22 (×4): 12.5 mg via ORAL
  Filled 2012-04-19 (×10): qty 1

## 2012-04-19 MED ORDER — ALPRAZOLAM 0.5 MG PO TABS
1.0000 mg | ORAL_TABLET | Freq: Four times a day (QID) | ORAL | Status: DC | PRN
  Administered 2012-04-20 (×2): 1 mg via ORAL
  Filled 2012-04-19: qty 1
  Filled 2012-04-19: qty 2

## 2012-04-19 MED ORDER — RISPERIDONE 1 MG PO TABS
1.0000 mg | ORAL_TABLET | Freq: Every day | ORAL | Status: DC
  Administered 2012-04-20 – 2012-04-22 (×2): 1 mg via ORAL
  Filled 2012-04-19 (×3): qty 1

## 2012-04-19 MED ORDER — ZIPRASIDONE MESYLATE 20 MG IM SOLR
20.0000 mg | Freq: Once | INTRAMUSCULAR | Status: AC
  Administered 2012-04-19: 20 mg via INTRAMUSCULAR
  Filled 2012-04-19: qty 20

## 2012-04-19 MED ORDER — LORAZEPAM 2 MG/ML IJ SOLN
2.0000 mg | Freq: Once | INTRAMUSCULAR | Status: AC
  Administered 2012-04-19: 2 mg via INTRAMUSCULAR
  Filled 2012-04-19: qty 1

## 2012-04-19 NOTE — ED Notes (Signed)
Pt was d/c'ed from [facility] last week. Pt had pace maker placed. Pt has been non-verbal today and not recognizing family members. Pt's sister states that pt had a child die 16 yrs ago this time of year and that pt always gets sad and depressed this time of year due to the death of her child. Pt takes Risperdol and Xanax. Pt last had Xanax at 1300 today. Pt arrives non-verbal, no eye contact. Does not answer questions.

## 2012-04-19 NOTE — ED Provider Notes (Addendum)
History     CSN: [id number]  Arrival date & time 04/19/12  4696   First MD Initiated Contact with Patient 04/19/12 1853      Chief Complaint  Patient presents with  . Altered Mental Status    (Consider location/radiation/quality/duration/timing/severity/associated sxs/prior treatment) HPI Comments: Nancy Drake is a 51 y.o. Female who is brought in by her sister for confusion. The patient is upset over a recent serious medical problem and that death of her child 16 years ago. She has been unable to perform her usual activities at home for several days, which include usually taking care of her child and doing household chores. She was admitted to the hospital. Several weeks ago with bradycardia, and required, a pacemaker for improvement of her cardiac activity. She reportedly had sudden death  , and required CPR on admission to hospital. Since being discharged she is reportedly using her medicines. The patient. Cannot contribute to history tonight.   Level V Caveat  The history is provided by the patient.    Past Medical History  Diagnosis Date  . Anxiety   . 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 Cardiomyopathy   . Syncope     while walking on treadmill  . LBBB (left bundle branch block)     SEEMS TO BE CHRONIC    Past Surgical History  Procedure Date  . Cesarean section   . Pace maker     Family History  Problem Relation Age of Onset  . Other Other     uncle with pacemaker  . Heart disease Other     PACERMAKER    History  Substance Use Topics  . Smoking status: Never Smoker   . Smokeless tobacco: Never Used  . Alcohol Use: No     socially    OB History    Grav Para Term Preterm Abortions TAB SAB Ect Mult Living                  Review of Systems  Unable to perform ROS   Allergies  Desloratadine  Home Medications   Current Outpatient Rx  Name Route Sig Dispense Refill  . ALPRAZOLAM 1 MG PO TABS Oral Take 0.5-1 mg by mouth 4 (four)  times daily as needed. For anxiety    . CARVEDILOL 12.5 MG PO TABS Oral Take 12.5 mg by mouth 2 (two) times daily with a meal.    . LISINOPRIL 5 MG PO TABS Oral Take 5 mg by mouth 2 (two) times daily.    . ADULT MULTIVITAMIN W/MINERALS CH Oral Take 1 tablet by mouth daily.    Marland Kitchen RISPERIDONE 0.5 MG PO TABS Oral Take 1 mg by mouth daily.       BP 178/93  Pulse 130  Temp 100.1 F (37.8 C) (Oral)  Resp 17  SpO2 98%  LMP 02/21/2012  Physical Exam  Nursing note and vitals reviewed. Constitutional: She appears well-developed and well-nourished.       She fights attempts to examine her, by pushing away.  HENT:  Head: Normocephalic and atraumatic.  Right Ear: External ear normal.  Left Ear: External ear normal.  Eyes: Conjunctivae and EOM are normal. Pupils are equal, round, and reactive to light.  Neck: Phonation normal. Neck supple.  Cardiovascular: Normal rate.   Pulmonary/Chest: Effort normal. She exhibits no bony tenderness.  Abdominal: Soft. Normal appearance. There is no tenderness.  Musculoskeletal: Normal range of motion.  Neurological: She is alert. She has normal  strength. No cranial nerve deficit or sensory deficit. She exhibits normal muscle tone.       Moves all extremities equally  Skin: Skin is warm, dry and intact.  Psychiatric:       Agitated. Uncooperative. Aggressive.    ED Course  Procedures (including critical care time)  Emergency department treatment: Geodon, and Ativan IM.  Consult with assessment and crisis team, and tele-psychiatry. We'll attempt to start, usual medications.   Date: 04/19/2012   Rate: 126  Rhythm: Ventricular pacing  QRS Axis: NA  Intervals: Normal QT  ST/T Wave abnormalities: nonspecific ST abnormality  Conduction Disutrbances: NA  Narrative Interpretation:   Old EKG Reviewed: unchanged    Labs Reviewed  CBC WITH DIFFERENTIAL - Abnormal; Notable for the following:    WBC 13.6 (*)     Neutro Abs 9.6 (*)     Monocytes  Absolute 1.5 (*)     All other components within normal limits  COMPREHENSIVE METABOLIC PANEL - Abnormal; Notable for the following:    Glucose, Bld 135 (*)     All other components within normal limits  URINE RAPID DRUG SCREEN (HOSP PERFORMED) - Abnormal; Notable for the following:    Benzodiazepines POSITIVE (*)     All other components within normal limits  URINALYSIS, ROUTINE W REFLEX MICROSCOPIC  ETHANOL  POCT PREGNANCY, URINE      1. Psychosis       MDM  Psychosis, nonspecific, possibly related to posttraumatic stress disorder from remote death of son, and recent near death experience.     Flint Melter, MD 04/19/12 2345     20:25- patient has been reevaluated by the tele-psychiatry. She has improved. She is alert, cooperative, lucid, alert, and oriented, and is not psychotic. Dr. Rob Bunting feels that she can go home with usual medications, increasing Risperdal to 1 mg q day and followup with her psychiatrist. The patient has her medications at home.  She is being treated for a UTI here, but cultures pending. Will treat with short course of Macrodantin.    Flint Melter, MD 04/22/12 2151

## 2012-04-19 NOTE — ED Notes (Signed)
EKG handed to Dr. Wentz 

## 2012-04-20 ENCOUNTER — Encounter (HOSPITAL_COMMUNITY): Payer: Self-pay | Admitting: *Deleted

## 2012-04-20 DIAGNOSIS — F29 Unspecified psychosis not due to a substance or known physiological condition: Secondary | ICD-10-CM

## 2012-04-20 MED ORDER — ZOLPIDEM TARTRATE 5 MG PO TABS
5.0000 mg | ORAL_TABLET | Freq: Every evening | ORAL | Status: DC | PRN
  Administered 2012-04-20: 5 mg via ORAL
  Filled 2012-04-20: qty 1

## 2012-04-20 MED ORDER — IBUPROFEN 200 MG PO TABS: 600.0000 mg | ORAL_TABLET | Freq: Three times a day (TID) | ORAL | Status: DC | PRN

## 2012-04-20 MED ORDER — ONDANSETRON HCL 4 MG PO TABS: 4.0000 mg | ORAL_TABLET | Freq: Three times a day (TID) | ORAL | Status: DC | PRN

## 2012-04-20 MED ORDER — LORAZEPAM 1 MG PO TABS
2.0000 mg | ORAL_TABLET | Freq: Four times a day (QID) | ORAL | Status: DC | PRN
  Administered 2012-04-20 (×2): 2 mg via ORAL
  Filled 2012-04-20 (×4): qty 2

## 2012-04-20 MED ORDER — ACETAMINOPHEN 325 MG PO TABS
650.0000 mg | ORAL_TABLET | ORAL | Status: DC | PRN
  Administered 2012-04-22: 650 mg via ORAL
  Filled 2012-04-20: qty 2

## 2012-04-20 MED ORDER — ZIPRASIDONE MESYLATE 20 MG IM SOLR
20.0000 mg | Freq: Two times a day (BID) | INTRAMUSCULAR | Status: DC | PRN
  Administered 2012-04-20 – 2012-04-21 (×2): 20 mg via INTRAMUSCULAR
  Filled 2012-04-20 (×2): qty 20

## 2012-04-20 MED ORDER — NICOTINE 21 MG/24HR TD PT24: 21.0000 mg | MEDICATED_PATCH | Freq: Every day | TRANSDERMAL | Status: DC | PRN

## 2012-04-20 NOTE — ED Notes (Signed)
Sitter called for safety.

## 2012-04-20 NOTE — ED Notes (Signed)
pts sister visited briefly.

## 2012-04-20 NOTE — ED Notes (Signed)
Pt ambulated 20 ft without assistance or difficulty. Pt anxious and restless.

## 2012-04-20 NOTE — ED Notes (Addendum)
Pt becoming increasingly agitated, flight of ideas and incomplete sentences, pt also speaking in spanish. Pt reports she is pregnant with twins. rn will attempt to help pt call her sister as pt request. Pt able to ambulate independently. Pt has come to desk several times, redirected back to room and tv turned on per pts request.

## 2012-04-20 NOTE — ED Notes (Signed)
Pt in room screaming at staff and family members. Pt screaming, "Emerald. Emerald. Emerald. Emerald. What do you think you're doing, asshole? We need to save lives. This whole plane is on fire."

## 2012-04-20 NOTE — ED Notes (Signed)
Pt reporting that her feet and head have been cut off. Pt redirected by tech and rn. At present pt sitting on bed. rn try to encourage pt to take a nap. Pt stating "she is in the OR and going to lose the babies."

## 2012-04-20 NOTE — ED Notes (Signed)
Pt family member states that the pt lost a child 16 years ago and has been acting this way since which exacerbates this month around the time of death. Pt also lost mother last year. Pt had fainted 04/12/12 and had CPR performed on the transport to the hospital. Pt had a pacemaker placed 2 days after and has been fine since. Pacemaker was pacing on arrival and EKG was printed and shown to physician. Pt has had flight of ideas talking about being in a plane that's on fire, biking from [place], that she's dead, and constantly talks about saving lives. Pt is soothed with the word and colors resembling "emerald" which is her favorite stone.

## 2012-04-20 NOTE — ED Notes (Signed)
Pt sister at bedside °

## 2012-04-20 NOTE — Consult Note (Signed)
Reason for Consult:psychosis Referring Physician: Dr. Osborne Drake is an 51 y.o. female.  HPI: Patient was seen and chart reviewed. Patient was brought in by her sister to the [facility] long emergency department for treatment of psychotic symptoms, delusional thoughts and bizarre behavior. Reportedly patient was upset about recent diagnosis of for cardiac problems required pacemaker (complete heart block). She also has a loss of her child 16 years ago and the has been depressed every year during anniversary of death. Patient was nonverbal upon arrival, and not recognizing family members. Patient has been taking outpatient psychiatric medication Risperdal and Xanax. He is not clearly if she has been noncompliant with her medications. Patient appeared to be responding to the internal stimuli, unable to provide clear, linear and goal-directed history. Patient tried to eat her lunch tray without success, has been keep on touching one after the other containers. She has spilled water and the piece of peach bowel in her bed and did not even realize and has showed no emotions.  Patient has diagnosed as schizophrenic form disorder as per the chart and has been receiving Risperdal 1 mg daily and Xanax unknown amount. Patient received Geodon 20 mg and Ativan 2 mg last evening.  Past Medical History  Diagnosis Date  . Anxiety   . 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 Cardiomyopathy   . Syncope     while walking on treadmill  . LBBB (left bundle branch block)     SEEMS TO BE CHRONIC    Past Surgical History  Procedure Date  . Cesarean section   . Pace maker     Family History  Problem Relation Age of Onset  . Other Other     uncle with pacemaker  . Heart disease Other     PACERMAKER    Social History:  reports that she has never smoked. She has never used smokeless tobacco. She reports that she does not drink alcohol or use illicit drugs.  Allergies:  Allergies  Allergen  Reactions  . Desloratadine     Could not sleep.     Medications: I have reviewed the patient's current medications.  Results for orders placed during the hospital encounter of 04/19/12 (from the past 48 hour(s))  CBC WITH DIFFERENTIAL     Status: Abnormal   Collection Time   04/19/12  7:00 PM      Component Value Range Comment   WBC 13.6 (*) 4.0 - 10.5 K/uL    RBC 4.27  3.87 - 5.11 MIL/uL    Hemoglobin 12.9  12.0 - 15.0 g/dL    HCT 19.1  47.8 - 29.5 %    MCV 88.1  78.0 - 100.0 fL    MCH 30.2  26.0 - 34.0 pg    MCHC 34.3  30.0 - 36.0 g/dL    RDW 62.1  30.8 - 65.7 %    Platelets 372  150 - 400 K/uL    Neutrophils Relative 71  43 - 77 %    Neutro Abs 9.6 (*) 1.7 - 7.7 K/uL    Lymphocytes Relative 18  12 - 46 %    Lymphs Abs 2.4  0.7 - 4.0 K/uL    Monocytes Relative 11  3 - 12 %    Monocytes Absolute 1.5 (*) 0.1 - 1.0 K/uL    Eosinophils Relative 0  0 - 5 %    Eosinophils Absolute 0.0  0.0 - 0.7 K/uL    Basophils Relative 0  0 - 1 %    Basophils Absolute 0.1  0.0 - 0.1 K/uL   COMPREHENSIVE METABOLIC PANEL     Status: Abnormal   Collection Time   04/19/12  7:00 PM      Component Value Range Comment   Sodium 139  135 - 145 mEq/L    Potassium 3.7  3.5 - 5.1 mEq/L    Chloride 101  96 - 112 mEq/L    CO2 24  19 - 32 mEq/L    Glucose, Bld 135 (*) 70 - 99 mg/dL    BUN 20  6 - 23 mg/dL    Creatinine, Ser 4.78  0.50 - 1.10 mg/dL    Calcium 29.5  8.4 - 10.5 mg/dL    Total Protein 8.2  6.0 - 8.3 g/dL    Albumin 4.3  3.5 - 5.2 g/dL    AST 23  0 - 37 U/L    ALT 23  0 - 35 U/L    Alkaline Phosphatase 82  39 - 117 U/L    Total Bilirubin 0.3  0.3 - 1.2 mg/dL    GFR calc non Af Amer >90  >90 mL/min    GFR calc Af Amer >90  >90 mL/min   ETHANOL     Status: Normal   Collection Time   04/19/12  7:00 PM      Component Value Range Comment   Alcohol, Ethyl (B) <11  0 - 11 mg/dL   URINALYSIS, ROUTINE W REFLEX MICROSCOPIC     Status: Normal   Collection Time   04/19/12  7:54 PM       Component Value Range Comment   Color, Urine YELLOW  YELLOW    APPearance CLEAR  CLEAR    Specific Gravity, Urine 1.024  1.005 - 1.030    pH 6.0  5.0 - 8.0    Glucose, UA NEGATIVE  NEGATIVE mg/dL    Hgb urine dipstick NEGATIVE  NEGATIVE    Bilirubin Urine NEGATIVE  NEGATIVE    Ketones, ur NEGATIVE  NEGATIVE mg/dL    Protein, ur NEGATIVE  NEGATIVE mg/dL    Urobilinogen, UA 0.2  0.0 - 1.0 mg/dL    Nitrite NEGATIVE  NEGATIVE    Leukocytes, UA NEGATIVE  NEGATIVE MICROSCOPIC NOT DONE ON URINES WITH NEGATIVE PROTEIN, BLOOD, LEUKOCYTES, NITRITE, OR GLUCOSE <1000 mg/dL.  URINE RAPID DRUG SCREEN (HOSP PERFORMED)     Status: Abnormal   Collection Time   04/19/12  7:54 PM      Component Value Range Comment   Opiates NONE DETECTED  NONE DETECTED    Cocaine NONE DETECTED  NONE DETECTED    Benzodiazepines POSITIVE (*) NONE DETECTED    Amphetamines NONE DETECTED  NONE DETECTED    Tetrahydrocannabinol NONE DETECTED  NONE DETECTED    Barbiturates NONE DETECTED  NONE DETECTED   POCT PREGNANCY, URINE     Status: Normal   Collection Time   04/19/12  8:08 PM      Component Value Range Comment   Preg Test, Ur NEGATIVE  NEGATIVE     No results found.  Positive for depression, sleep disturbance and bizarre psychotic symptoms and delusional thoughts. Blood pressure 108/69, pulse 73, temperature 98.2 F (36.8 C), temperature source Oral, resp. rate 18, last menstrual period 02/21/2012, SpO2 96.00%.   Assessment/Plan: Psychosis disorder NOS versus schizophrenia found disorder  Recommended acute psychiatric hospitalization for crisis stabilization and medication management for psychosis and establishing outpatient psychiatric services upon stable.  Nancy Drake,Nancy R.  04/20/2012, 3:45 PM

## 2012-04-20 NOTE — ED Notes (Signed)
Respirations even and unlabored, bilateral symmetrical rise and fall of chest. Skin warm and dry. In no acute distress. Denies needs.   

## 2012-04-20 NOTE — ED Notes (Signed)
Pt in bed naked taking off cardiac monitor stickers and attempting to take off IV. Pt stating, "They need the blood. They need the blood. We need to save lives. We need to save my life." IV kept in tact by intervention via family member and staff.

## 2012-04-20 NOTE — ED Provider Notes (Signed)
Filed Vitals:   04/20/12 0349  BP: 119/78  Pulse: 86  Temp: 98.3 F (36.8 C)  Resp: 18   Patient remains stable.  Awaiting 400 bed placement.  Cyndra Numbers, MD 04/20/12 [phone]

## 2012-04-20 NOTE — ED Notes (Signed)
Sitter arrived and at bedside. Family member left.

## 2012-04-20 NOTE — BH Assessment (Signed)
Assessment Note   Nancy Drake is a 51 y.o. female who presents to [facility] with psychosis/delusional behaviors.  Pt was brought in by her sister who states pt has been upset recently over a serious medical problem and the death of her child 16 yrs ago.  Pt is responding to internal stimuli, upon entering pt.'s room, she has pressured speech and is incoherent. .  Pt is repeating the word "yes" and staring at the ceiling.  Pt stated the she is here to save the world and from 9/11.  Pt says--"I can fly a plane and I have to save 100 people".  Pt says to this writer--"This is a fucking emergency". Pt unable to have a intelligible  conversation with this Clinical research associate.  Pt says the voices in her head are telling her to cut her head off. Pt is not presenting with violent behavior and is re-directable.     Axis I: Psychotic Disorder NOS Axis II: Deferred Axis III:  Past Medical History  Diagnosis Date  . Anxiety   . 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 Cardiomyopathy   . Syncope     while walking on treadmill  . LBBB (left bundle branch block)     SEEMS TO BE CHRONIC   Axis IV: other psychosocial or environmental problems, problems related to social environment and problems with primary support group Axis V: 21-30 behavior considerably influenced by delusions or hallucinations OR serious impairment in judgment, communication OR inability to function in almost all areas  Past Medical History:  Past Medical History  Diagnosis Date  . Anxiety   . 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 Cardiomyopathy   . Syncope     while walking on treadmill  . LBBB (left bundle branch block)     SEEMS TO BE CHRONIC    Past Surgical History  Procedure Date  . Cesarean section   . Pace maker     Family History:  Family History  Problem Relation Age of Onset  . Other Other     uncle with pacemaker  . Heart disease Other     PACERMAKER    Social History:  reports that she has never  smoked. She has never used smokeless tobacco. She reports that she does not drink alcohol or use illicit drugs.  Additional Social History:  Alcohol / Drug Use Pain Medications: None  Prescriptions: None  Over the Counter: None  History of alcohol / drug use?: No history of alcohol / drug abuse Longest period of sobriety (when/how long): None   CIWA: CIWA-Ar BP: 137/72 mmHg Pulse Rate: 82  COWS:    Allergies:  Allergies  Allergen Reactions  . Desloratadine     Could not sleep.     Home Medications:  (Not in a hospital admission)  OB/GYN Status:  Patient's last menstrual period was 02/21/2012.  General Assessment Data Location of Assessment: [facility] Living Arrangements: Children;Parent Can pt return to current living arrangement?: Yes Admission Status: Voluntary Is patient capable of signing voluntary admission?: No Transfer from: Acute Hospital Referral Source: MD  Education Status Is patient currently in school?: No Current Grade: None  Highest grade of school patient has completed: None  Name of school: None  Contact person: None   Risk to self Suicidal Ideation: No Suicidal Intent: No Is patient at risk for suicide?: No Suicidal Plan?: No Access to Means: No What has been your use of drugs/alcohol within the last 12  months?: Pt denies  Previous Attempts/Gestures: No How many times?: 0  Other Self Harm Risks: None  Triggers for Past Attempts: None known Intentional Self Injurious Behavior: None Family Suicide History: No Recent stressful life event(s): Other (Comment) (Daughter passed during this time of yr) Persecutory voices/beliefs?: No Depression: No Depression Symptoms:  (None ) Substance abuse history and/or treatment for substance abuse?: No Suicide prevention information given to non-admitted patients: Not applicable  Risk to Others Homicidal Ideation: No Thoughts of Harm to Others: No Current Homicidal Intent: No Current Homicidal Plan:  No Access to Homicidal Means: No Identified Victim: None  History of harm to others?: No Assessment of Violence: None Noted Violent Behavior Description: None  Does patient have access to weapons?: No Criminal Charges Pending?: No Does patient have a court date: No  Psychosis Hallucinations: Auditory;With command Delusions: Grandiose;Unspecified  Mental Status Report Appear/Hygiene: Disheveled Eye Contact: Good Motor Activity: Unremarkable Speech: Tangential;Pressured;Rapid;Incoherent Level of Consciousness: Alert Mood: Preoccupied Affect: Preoccupied;Appropriate to circumstance Anxiety Level: Minimal Thought Processes: Tangential;Flight of Ideas Judgement: Impaired Orientation: Place;Person;Situation Obsessive Compulsive Thoughts/Behaviors: Moderate  Cognitive Functioning Concentration: Decreased Memory: Remote Intact;Recent Impaired IQ: Average Insight: Poor Impulse Control: Poor Appetite: Fair Weight Loss: 0  Weight Gain: 0  Sleep: No Change Total Hours of Sleep:  (Unk ) Vegetative Symptoms: None  ADLScreening [facility]) Patient's cognitive ability adequate to safely complete daily activities?: Yes Patient able to express need for assistance with ADLs?: Yes Independently performs ADLs?: Yes (appropriate for developmental age)  Abuse/Neglect [facility]) Physical Abuse: Denies Verbal Abuse: Denies Sexual Abuse: Denies  Prior Inpatient Therapy Prior Inpatient Therapy: No Prior Therapy Dates: None  Prior Therapy Facilty/Provider(s): None  Reason for Treatment: None   Prior Outpatient Therapy Prior Outpatient Therapy: No Prior Therapy Dates: None  Prior Therapy Facilty/Provider(s): None  Reason for Treatment: None   ADL Screening (condition at time of admission) Patient's cognitive ability adequate to safely complete daily activities?: Yes Patient able to express need for assistance with ADLs?: Yes Independently performs ADLs?: Yes (appropriate  for developmental age) Weakness of Legs: None Weakness of Arms/Hands: None  Home Assistive Devices/Equipment Home Assistive Devices/Equipment: None  Therapy Consults (therapy consults require a physician order) PT Evaluation Needed: No OT Evalulation Needed: No SLP Evaluation Needed: No Abuse/Neglect Assessment (Assessment to be complete while patient is alone) Physical Abuse: Denies Verbal Abuse: Denies Sexual Abuse: Denies Exploitation of patient/patient's resources: Denies Self-Neglect: Denies Values / Beliefs Cultural Requests During Hospitalization: None Spiritual Requests During Hospitalization: None Consults Spiritual Care Consult Needed: No Social Work Consult Needed: No Merchant navy officer (For Healthcare) Advance Directive: Patient does not have advance directive;Patient would not like information Pre-existing out of facility DNR order (yellow form or pink MOST form): No Nutrition Screen Diet: Regular  Additional Information 1:1 In Past 12 Months?: No CIRT Risk: No Elopement Risk: No Does patient have medical clearance?: Yes     Disposition:  Disposition Disposition of Patient: Inpatient treatment program;Referred to [facility], [facility] ) Type of inpatient treatment program: Adult Patient referred to: Other (Comment) ([facility], [facility] )  On Site Evaluation by:   Reviewed with Physician:     Murrell Redden 04/20/2012 2:40 AM

## 2012-04-20 NOTE — ED Notes (Signed)
Pt at this time becoming more focused on the idea "she is having twins". Pt will not move from bed and tries to stay in supine position. Pt has torn scrub pants. Pt given new pair of scrub pants and instructed to go to bathroom to change pants.

## 2012-04-20 NOTE — ED Notes (Signed)
Pt at present still has altered LOC. Pt has hands clinched and would not let rn take vital signs, rn further tried to explain, but pt still refused. Pt not making complete sentences and thoughts very disorganized.. pt reported that the rn present was "her sister" pt reported her birthday was [birth year]. pt too altered to have further conversation and answer sentences or complete assessment.

## 2012-04-21 ENCOUNTER — Emergency Department (HOSPITAL_COMMUNITY): Payer: BC Managed Care – PPO

## 2012-04-21 ENCOUNTER — Ambulatory Visit: Payer: BC Managed Care – PPO

## 2012-04-21 LAB — POCT I-STAT, CHEM 8
Calcium, Ion: 1.21 mmol/L (ref 1.12–1.23)
Creatinine, Ser: 0.8 mg/dL (ref 0.50–1.10)
Glucose, Bld: 111 mg/dL — ABNORMAL HIGH (ref 70–99)
HCT: 40 % (ref 36.0–46.0)
HCT: 39 % (ref 36.0–46.0)
Hemoglobin: 13.6 g/dL (ref 12.0–15.0)
Hemoglobin: 13.3 g/dL (ref 12.0–15.0)
Potassium: 3.7 mEq/L (ref 3.5–5.1)
Potassium: 3.8 mEq/L (ref 3.5–5.1)
Sodium: 141 mEq/L (ref 135–145)
TCO2: 23 mmol/L (ref 0–100)
TCO2: 25 mmol/L (ref 0–100)

## 2012-04-21 LAB — COMPREHENSIVE METABOLIC PANEL
ALT: 20 U/L (ref 0–35)
Calcium: 9.8 mg/dL (ref 8.4–10.5)
Creatinine, Ser: 0.65 mg/dL (ref 0.50–1.10)
GFR calc Af Amer: >90 mL/min (ref >90)
Glucose, Bld: 109 mg/dL — ABNORMAL HIGH (ref 70–99)
Sodium: 140 mEq/L (ref 135–145)
Total Protein: 7.6 g/dL (ref 6.0–8.3)

## 2012-04-21 LAB — CBC WITH DIFFERENTIAL/PLATELET
Basophils Absolute: 0 10*3/uL (ref 0.0–0.1)
Eosinophils Absolute: 0.2 10*3/uL (ref 0.0–0.7)
Eosinophils Relative: 1 % (ref 0–5)
Lymphs Abs: 2.4 10*3/uL (ref 0.7–4.0)
MCH: 30.3 pg (ref 26.0–34.0)
MCV: 87.8 fL (ref 78.0–100.0)
Monocytes Absolute: 0.9 10*3/uL (ref 0.1–1.0)
Platelets: 344 10*3/uL (ref 150–400)
RDW: 12.6 % (ref 11.5–15.5)

## 2012-04-21 LAB — URINE MICROSCOPIC-ADD ON

## 2012-04-21 LAB — GLUCOSE, CAPILLARY
Glucose-Capillary: 108 mg/dL — ABNORMAL HIGH (ref 70–99)
Glucose-Capillary: 107 mg/dL — ABNORMAL HIGH (ref 70–99)

## 2012-04-21 LAB — LACTIC ACID, PLASMA: Lactic Acid, Venous: 0.7 mmol/L (ref 0.5–2.2)

## 2012-04-21 LAB — URINALYSIS, ROUTINE W REFLEX MICROSCOPIC
Bilirubin Urine: NEGATIVE
Hgb urine dipstick: NEGATIVE
Protein, ur: NEGATIVE mg/dL
Urobilinogen, UA: 1 mg/dL (ref 0.0–1.0)

## 2012-04-21 LAB — POCT I-STAT TROPONIN I

## 2012-04-21 MED ORDER — LIDOCAINE HCL 1 % IJ SOLN
INTRAMUSCULAR | Status: AC
  Administered 2012-04-21: 18:00:00
  Filled 2012-04-21: qty 20

## 2012-04-21 MED ORDER — CARVEDILOL 12.5 MG PO TABS: 12.5000 mg | ORAL_TABLET | Freq: Two times a day (BID) | ORAL | Status: DC

## 2012-04-21 MED ORDER — RISPERIDONE 1 MG PO TABS
1.0000 mg | ORAL_TABLET | Freq: Once | ORAL | Status: AC
  Administered 2012-04-21: 1 mg via ORAL

## 2012-04-21 MED ORDER — CEFTRIAXONE SODIUM 1 G IJ SOLR
1.0000 g | INTRAMUSCULAR | Status: DC
  Administered 2012-04-21 – 2012-04-22 (×2): 1 g via INTRAMUSCULAR
  Filled 2012-04-21 (×2): qty 10

## 2012-04-21 MED ORDER — HALOPERIDOL LACTATE 5 MG/ML IJ SOLN
5.0000 mg | Freq: Once | INTRAMUSCULAR | Status: AC
  Administered 2012-04-21: 5 mg via INTRAMUSCULAR
  Filled 2012-04-21: qty 1

## 2012-04-21 MED ORDER — SULFAMETHOXAZOLE-TMP DS 800-160 MG PO TABS: 1.0000 | ORAL_TABLET | Freq: Once | ORAL | Status: DC

## 2012-04-21 MED ORDER — LISINOPRIL 5 MG PO TABS: 5.0000 mg | ORAL_TABLET | Freq: Once | ORAL | Status: DC

## 2012-04-21 NOTE — ED Notes (Signed)
Pt is alert but unresponsive to any form of communication. Pt becomes very rigid when trying to move arm to obtain a blood pressure. Pt doesn't respond when any help, food or drink is offered

## 2012-04-21 NOTE — ED Notes (Signed)
Able to ambulate pt with great encouragement.

## 2012-04-21 NOTE — ED Notes (Signed)
Patient transported to CT- and will return to room 16 in the main ED

## 2012-04-21 NOTE — ED Notes (Signed)
Spoke with Dr Weldon Inches regarding pt condition; informed pt not taking any oral intake; notified pt has not voided today.  Continues to be nonverbal.  Informed of continued low grade fever and pt diaphoretic.  Notified of heart rate 100.  No orders given.  States will have psychiatrist evaluate patient in the morning.

## 2012-04-21 NOTE — ED Notes (Signed)
Patient was hollering and holding her arms together we couldn't put bp cuff on arm

## 2012-04-21 NOTE — ED Notes (Signed)
Pt father at bedside.

## 2012-04-21 NOTE — ED Notes (Signed)
ION:[id number]<MW> Expected date:04/21/12<BR> Expected time: 3:03 PM<BR> Means of arrival:Other<BR> Comments:<BR> Hold rm 35

## 2012-04-21 NOTE — Consult Note (Signed)
Reason for Consult: Psychosis Referring Physician: Dr. Osborne Oman is an 51 y.o. female.  HPI: Patient was seen and chart reviewed. Patient was unable to open her eyes, respond verbally. Patient staff nurse reported she was not able to make her either drink fluids or eat food and her medication today. Case discussed with the ER physician, Dr. Alto Denver regarding her current situation and the possibility of medical complications. Patient was a evaluated medically and found she has been stable except cardiac examination at this time which is in process.  Past Medical History  Diagnosis Date  . Anxiety   . 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 Cardiomyopathy   . Syncope     while walking on treadmill  . LBBB (left bundle branch block)     SEEMS TO BE CHRONIC    Past Surgical History  Procedure Date  . Cesarean section   . Pace maker     Family History  Problem Relation Age of Onset  . Other Other     uncle with pacemaker  . Heart disease Other     PACERMAKER    Social History:  reports that she has never smoked. She has never used smokeless tobacco. She reports that she does not drink alcohol or use illicit drugs.  Allergies:  Allergies  Allergen Reactions  . Desloratadine     Could not sleep.     Medications: I have reviewed the patient's current medications.  Results for orders placed during the hospital encounter of 04/19/12 (from the past 48 hour(s))  CBC WITH DIFFERENTIAL     Status: Abnormal   Collection Time   04/19/12  7:00 PM      Component Value Range Comment   WBC 13.6 (*) 4.0 - 10.5 K/uL    RBC 4.27  3.87 - 5.11 MIL/uL    Hemoglobin 12.9  12.0 - 15.0 g/dL    HCT 16.1  09.6 - 04.5 %    MCV 88.1  78.0 - 100.0 fL    MCH 30.2  26.0 - 34.0 pg    MCHC 34.3  30.0 - 36.0 g/dL    RDW 40.9  81.1 - 91.4 %    Platelets 372  150 - 400 K/uL    Neutrophils Relative 71  43 - 77 %    Neutro Abs 9.6 (*) 1.7 - 7.7 K/uL    Lymphocytes Relative 18  12 -  46 %    Lymphs Abs 2.4  0.7 - 4.0 K/uL    Monocytes Relative 11  3 - 12 %    Monocytes Absolute 1.5 (*) 0.1 - 1.0 K/uL    Eosinophils Relative 0  0 - 5 %    Eosinophils Absolute 0.0  0.0 - 0.7 K/uL    Basophils Relative 0  0 - 1 %    Basophils Absolute 0.1  0.0 - 0.1 K/uL   COMPREHENSIVE METABOLIC PANEL     Status: Abnormal   Collection Time   04/19/12  7:00 PM      Component Value Range Comment   Sodium 139  135 - 145 mEq/L    Potassium 3.7  3.5 - 5.1 mEq/L    Chloride 101  96 - 112 mEq/L    CO2 24  19 - 32 mEq/L    Glucose, Bld 135 (*) 70 - 99 mg/dL    BUN 20  6 - 23 mg/dL    Creatinine, Ser 7.82  0.50 - 1.10 mg/dL  Calcium 10.4  8.4 - 10.5 mg/dL    Total Protein 8.2  6.0 - 8.3 g/dL    Albumin 4.3  3.5 - 5.2 g/dL    AST 23  0 - 37 U/L    ALT 23  0 - 35 U/L    Alkaline Phosphatase 82  39 - 117 U/L    Total Bilirubin 0.3  0.3 - 1.2 mg/dL    GFR calc non Af Amer >90  >90 mL/min    GFR calc Af Amer >90  >90 mL/min   ETHANOL     Status: Normal   Collection Time   04/19/12  7:00 PM      Component Value Range Comment   Alcohol, Ethyl (B) <11  0 - 11 mg/dL   URINALYSIS, ROUTINE W REFLEX MICROSCOPIC     Status: Normal   Collection Time   04/19/12  7:54 PM      Component Value Range Comment   Color, Urine YELLOW  YELLOW    APPearance CLEAR  CLEAR    Specific Gravity, Urine 1.024  1.005 - 1.030    pH 6.0  5.0 - 8.0    Glucose, UA NEGATIVE  NEGATIVE mg/dL    Hgb urine dipstick NEGATIVE  NEGATIVE    Bilirubin Urine NEGATIVE  NEGATIVE    Ketones, ur NEGATIVE  NEGATIVE mg/dL    Protein, ur NEGATIVE  NEGATIVE mg/dL    Urobilinogen, UA 0.2  0.0 - 1.0 mg/dL    Nitrite NEGATIVE  NEGATIVE    Leukocytes, UA NEGATIVE  NEGATIVE MICROSCOPIC NOT DONE ON URINES WITH NEGATIVE PROTEIN, BLOOD, LEUKOCYTES, NITRITE, OR GLUCOSE <1000 mg/dL.  URINE RAPID DRUG SCREEN (HOSP PERFORMED)     Status: Abnormal   Collection Time   04/19/12  7:54 PM      Component Value Range Comment   Opiates NONE  DETECTED  NONE DETECTED    Cocaine NONE DETECTED  NONE DETECTED    Benzodiazepines POSITIVE (*) NONE DETECTED    Amphetamines NONE DETECTED  NONE DETECTED    Tetrahydrocannabinol NONE DETECTED  NONE DETECTED    Barbiturates NONE DETECTED  NONE DETECTED   POCT PREGNANCY, URINE     Status: Normal   Collection Time   04/19/12  8:08 PM      Component Value Range Comment   Preg Test, Ur NEGATIVE  NEGATIVE   GLUCOSE, CAPILLARY     Status: Abnormal   Collection Time   04/21/12  8:10 AM      Component Value Range Comment   Glucose-Capillary 108 (*) 70 - 99 mg/dL   URINALYSIS, ROUTINE W REFLEX MICROSCOPIC     Status: Abnormal   Collection Time   04/21/12  3:48 PM      Component Value Range Comment   Color, Urine YELLOW  YELLOW    APPearance CLOUDY (*) CLEAR    Specific Gravity, Urine 1.024  1.005 - 1.030    pH 6.0  5.0 - 8.0    Glucose, UA NEGATIVE  NEGATIVE mg/dL    Hgb urine dipstick NEGATIVE  NEGATIVE    Bilirubin Urine NEGATIVE  NEGATIVE    Ketones, ur 15 (*) NEGATIVE mg/dL    Protein, ur NEGATIVE  NEGATIVE mg/dL    Urobilinogen, UA 1.0  0.0 - 1.0 mg/dL    Nitrite POSITIVE (*) NEGATIVE    Leukocytes, UA NEGATIVE  NEGATIVE   URINE MICROSCOPIC-ADD ON     Status: Abnormal   Collection Time   04/21/12  3:48  PM      Component Value Range Comment   Squamous Epithelial / LPF RARE  RARE    WBC, UA 0-2  <3 WBC/hpf    Bacteria, UA MANY (*) RARE   GLUCOSE, CAPILLARY     Status: Abnormal   Collection Time   04/21/12  3:51 PM      Component Value Range Comment   Glucose-Capillary 107 (*) 70 - 99 mg/dL    Comment 1 Documented in Chart      Comment 2 Notify RN     CBC WITH DIFFERENTIAL     Status: Abnormal   Collection Time   04/21/12  4:00 PM      Component Value Range Comment   WBC 11.8 (*) 4.0 - 10.5 K/uL    RBC 4.33  3.87 - 5.11 MIL/uL    Hemoglobin 13.1  12.0 - 15.0 g/dL    HCT 16.1  09.6 - 04.5 %    MCV 87.8  78.0 - 100.0 fL    MCH 30.3  26.0 - 34.0 pg    MCHC 34.5  30.0 - 36.0  g/dL    RDW 40.9  81.1 - 91.4 %    Platelets 344  150 - 400 K/uL    Neutrophils Relative 70  43 - 77 %    Neutro Abs 8.3 (*) 1.7 - 7.7 K/uL    Lymphocytes Relative 20  12 - 46 %    Lymphs Abs 2.4  0.7 - 4.0 K/uL    Monocytes Relative 8  3 - 12 %    Monocytes Absolute 0.9  0.1 - 1.0 K/uL    Eosinophils Relative 1  0 - 5 %    Eosinophils Absolute 0.2  0.0 - 0.7 K/uL    Basophils Relative 0  0 - 1 %    Basophils Absolute 0.0  0.0 - 0.1 K/uL   COMPREHENSIVE METABOLIC PANEL     Status: Abnormal   Collection Time   04/21/12  4:00 PM      Component Value Range Comment   Sodium 140  135 - 145 mEq/L    Potassium 3.7  3.5 - 5.1 mEq/L    Chloride 103  96 - 112 mEq/L    CO2 25  19 - 32 mEq/L    Glucose, Bld 109 (*) 70 - 99 mg/dL    BUN 18  6 - 23 mg/dL    Creatinine, Ser 7.82  0.50 - 1.10 mg/dL    Calcium 9.8  8.4 - 95.6 mg/dL    Total Protein 7.6  6.0 - 8.3 g/dL    Albumin 4.0  3.5 - 5.2 g/dL    AST 28  0 - 37 U/L    ALT 20  0 - 35 U/L    Alkaline Phosphatase 75  39 - 117 U/L    Total Bilirubin 0.4  0.3 - 1.2 mg/dL    GFR calc non Af Amer >90  >90 mL/min    GFR calc Af Amer >90  >90 mL/min   AMMONIA     Status: Normal   Collection Time   04/21/12  4:00 PM      Component Value Range Comment   Ammonia 36  11 - 60 umol/L   LACTIC ACID, PLASMA     Status: Normal   Collection Time   04/21/12  4:00 PM      Component Value Range Comment   Lactic Acid, Venous 0.7  0.5 - 2.2 mmol/L  POCT I-STAT, CHEM 8     Status: Abnormal   Collection Time   04/21/12  4:11 PM      Component Value Range Comment   Sodium 141  135 - 145 mEq/L    Potassium 3.7  3.5 - 5.1 mEq/L    Chloride 107  96 - 112 mEq/L    BUN 17  6 - 23 mg/dL    Creatinine, Ser 8.29  0.50 - 1.10 mg/dL    Glucose, Bld 562 (*) 70 - 99 mg/dL    Calcium, Ion 1.30  8.65 - 1.23 mmol/L    TCO2 23  0 - 100 mmol/L    Hemoglobin 13.6  12.0 - 15.0 g/dL    HCT 78.4  69.6 - 29.5 %   POCT I-STAT, CHEM 8     Status: Abnormal   Collection Time     04/21/12  5:47 PM      Component Value Range Comment   Sodium 141  135 - 145 mEq/L    Potassium 3.8  3.5 - 5.1 mEq/L    Chloride 106  96 - 112 mEq/L    BUN 22  6 - 23 mg/dL    Creatinine, Ser 2.84  0.50 - 1.10 mg/dL    Glucose, Bld 132 (*) 70 - 99 mg/dL    Calcium, Ion 4.40 (*) 1.12 - 1.23 mmol/L    TCO2 25  0 - 100 mmol/L    Hemoglobin 13.3  12.0 - 15.0 g/dL    HCT 10.2  72.5 - 36.6 %   POCT I-STAT TROPONIN I     Status: Normal   Collection Time   04/21/12  5:50 PM      Component Value Range Comment   Troponin i, poc 0.00  0.00 - 0.08 ng/mL    Comment 3              Dg Chest 2 View  04/21/2012  *RADIOLOGY REPORT*  Clinical Data: Altered mental status  CHEST - 2 VIEW  Comparison: 04/13/2012  Findings: There is a left chest wall pacer device with lead in the right atrial appendage and right ventricle and coronary sinus.  The heart size appears normal.  There is no pleural effusion or edema. No airspace consolidation. Small granuloma noted within the right upper lobe.  The visualized bony structures appear intact.  IMPRESSION:  1.  No acute cardiopulmonary abnormalities noted.   Original Report Authenticated By: Rosealee Albee, M.D.    Ct Head Wo Contrast  04/21/2012  *RADIOLOGY REPORT*  Clinical Data: Altered mental status.  CT HEAD WITHOUT CONTRAST  Technique:  Contiguous axial images were obtained from the base of the skull through the vertex without contrast.  Comparison: None.  Findings: No acute intracranial abnormality.  Specifically, no hemorrhage, hydrocephalus, mass lesion, acute infarction, or significant intracranial injury.  No acute calvarial abnormality. Visualized paranasal sinuses and mastoids clear.  Orbital soft tissues unremarkable.  IMPRESSION: Normal study.   Original Report Authenticated By: Cyndie Chime, M.D.     Positive for pschosis, s/p pacemaker Blood pressure 155/87, pulse 94, temperature 97.7 F (36.5 C), temperature source Axillary, resp. rate 18, last  menstrual period 02/21/2012, SpO2 95.00%.   Assessment/Plan: Psychotic disorder, not other specified versus schizophrenia.  Recommended Geodon 10 mg IM twice daily if she fails to cooperate oral medications.  Wyeth Hoffer,[name] 04/21/2012, 6:09 PM

## 2012-04-21 NOTE — ED Provider Notes (Addendum)
Filed Vitals:   04/21/12 1405  BP: 167/97  Pulse: 94  Temp:   Resp: 18   Patient has had worsening of her psychosis today. Vital signs and intermittently shown hypertension do to patient resisting blood pressure measurements. When agitation has been controlled with IM Haldol values are within normal limits. Patient is refusing oral medications. Patient was discussed with [facility]. They refuse her based on concerns about her recent issues with her heart.  Staff was asked to reconsider by myself given that patient had had recent complete heart block but has had pacemaker placement. Patient continues to await placement. Rounding psychiatrist has been asked for recommendations regarding improved management of patient's psychosis as she is no longer taking oral medications.  3:11 PM Patient assessed by Dr. Andrew Au who was concerned regarding delirium. Patient has repeatedly had vital signs that are remarkable only for elevated blood pressure since refusing her antihypertensive medications orally. Patient also will not take her oral antipsychotic. Patient has blood pressures are 160s over 90s whenever she is not agitated. Patient has been moved back to the acute care side of the emergency department with repeat laboratory studies as well as chest x-ray and head CT to assess for any other possible changes. Oral medications have been reordered so that patient can attend to receive these again as nursing staff in acute care area is familiar with patient and would like to try administering these again.  5:41 PM Patient took all of her oral meds with new nursing staff.  Patient had neg head CT, CXR, improved WBC to 11 from 13, no anemia, improved renal function on CMP with no electrolyte abnormalities, normal ammonia, no ECG changes other than improved rate, and TNI was negative.  Urinalysis was unremarkable aside from being nitrite-positive compared to previous. There were only 2 white blood cells. Given  concern by psychiatry for contribution biliary and the patient's presentation urine culture was sent and patient was started on IM Rocephin. Dosing was confirmed by pharmacy. Patient was discussed again with Dr. Andrew Au who will continue treating the patient's psychosis.  Indication: AMS Please note this EKG was reviewed extemporaneously by myself.   Date: 04/21/2012  Rate: 96  Rhythm: atrial-sensed ventricularly paced  QRS Axis: indeterminate  Intervals: normal  ST/T Wave abnormalities: nonspecific ST/T changes  Conduction Disutrbances:paced  Narrative Interpretation:   Old EKG Reviewed: unchanged       Cyndra Numbers, MD 04/21/12 1419  Cyndra Numbers, MD 04/21/12 1516  Cyndra Numbers, MD 04/21/12 1754

## 2012-04-21 NOTE — ED Notes (Signed)
Pt offered to drink water through a straw.  Pt unsuccessful in drinking water

## 2012-04-21 NOTE — ED Notes (Signed)
Pt in room lying in bed, noted to have been in same position for some time. Staff went into pt room found that pt urinated in the bed, pt required assistance out of the bed to change linen and change her into new dry scrubs. Pt was resisting but not fighting, yelling to leave her alone, pt was able to have a normal conversation earlier in shift/communicate her needs appropriately, now pt exhibiting psychotic behavior. geodon 20 mg IM admin without difficulty and pt in room sitting up on bed watching television.

## 2012-04-22 DIAGNOSIS — R4182 Altered mental status, unspecified: Secondary | ICD-10-CM

## 2012-04-22 LAB — CBC WITH DIFFERENTIAL/PLATELET
Basophils Absolute: 0 10*3/uL (ref 0.0–0.1)
Basophils Relative: 0 % (ref 0–1)
Eosinophils Absolute: 0 10*3/uL (ref 0.0–0.7)
Eosinophils Relative: 0 % (ref 0–5)
HCT: 36.4 % (ref 36.0–46.0)
Hemoglobin: 12.6 g/dL (ref 12.0–15.0)
Lymphocytes Relative: 17 % (ref 12–46)
Lymphs Abs: 2.5 10*3/uL (ref 0.7–4.0)
MCH: 30.6 pg (ref 26.0–34.0)
MCHC: 34.6 g/dL (ref 30.0–36.0)
MCV: 88.3 fL (ref 78.0–100.0)
Monocytes Absolute: 1.1 10*3/uL — ABNORMAL HIGH (ref 0.1–1.0)
Monocytes Relative: 8 % (ref 3–12)
Neutro Abs: 10.7 10*3/uL — ABNORMAL HIGH (ref 1.7–7.7)
Neutrophils Relative %: 75 % (ref 43–77)
Platelets: 312 10*3/uL (ref 150–400)
RBC: 4.12 MIL/uL (ref 3.87–5.11)
RDW: 12.8 % (ref 11.5–15.5)
WBC: 14.4 10*3/uL — ABNORMAL HIGH (ref 4.0–10.5)

## 2012-04-22 LAB — URINE MICROSCOPIC-ADD ON

## 2012-04-22 LAB — COMPREHENSIVE METABOLIC PANEL
ALT: 19 U/L (ref 0–35)
AST: 28 U/L (ref 0–37)
Albumin: 3.7 g/dL (ref 3.5–5.2)
CO2: 21 mEq/L (ref 19–32)
Calcium: 9.5 mg/dL (ref 8.4–10.5)
Chloride: 98 mEq/L (ref 96–112)
Creatinine, Ser: 0.87 mg/dL (ref 0.50–1.10)
Sodium: 136 mEq/L (ref 135–145)

## 2012-04-22 LAB — URINE CULTURE: Colony Count: >=100000

## 2012-04-22 LAB — URINALYSIS, ROUTINE W REFLEX MICROSCOPIC
Bilirubin Urine: NEGATIVE
Glucose, UA: NEGATIVE mg/dL
Specific Gravity, Urine: 1.029 (ref 1.005–1.030)

## 2012-04-22 MED ORDER — RISPERIDONE 1 MG PO TABS: 1.0000 mg | ORAL_TABLET | Freq: Every day | ORAL | Status: DC

## 2012-04-22 MED ORDER — SODIUM CHLORIDE 0.9 % IV BOLUS (SEPSIS)
1000.0000 mL | Freq: Once | INTRAVENOUS | Status: AC
  Administered 2012-04-22: 1000 mL via INTRAVENOUS

## 2012-04-22 MED ORDER — RISPERIDONE 2 MG PO TABS: 2.0000 mg | ORAL_TABLET | Freq: Every day | ORAL | Status: DC

## 2012-04-22 MED ORDER — NITROFURANTOIN MONOHYD MACRO 100 MG PO CAPS: 100.0000 mg | ORAL_CAPSULE | Freq: Two times a day (BID) | ORAL | Status: AC

## 2012-04-22 MED ORDER — RISPERIDONE 1 MG PO TABS
1.0000 mg | ORAL_TABLET | Freq: Once | ORAL | Status: AC
  Administered 2012-04-22: 1 mg via ORAL
  Filled 2012-04-22: qty 1

## 2012-04-22 NOTE — ED Notes (Signed)
Pt ambulated in hall with sister. Pt smiling, accepting fluids and requested lunch tray.

## 2012-04-22 NOTE — ED Notes (Signed)
Pt asked for a coke, RN provided for pt and pt drank approx 100cc.

## 2012-04-22 NOTE — ED Notes (Signed)
SOC notified of telepsych order, info faxed.

## 2012-04-22 NOTE — ED Notes (Signed)
telepsych completed, Per Dr. Henderson Cloud he will fax recommendation, he feels she is ok to be d/c. Feels adding Risperdal 1mg  daily would be helpful, also pt should be dosed before d/c.

## 2012-04-22 NOTE — ED Notes (Signed)
Pt participating in telepsych conference.

## 2012-04-22 NOTE — Consult Note (Signed)
Reason for Consult: Altered mental status and psychosis NOS Referring Physician: Dr. [name] is an 51 y.o. female.  HPI: Patient was not attending ADL's. Unable to communicated meaningful way, speaks spanish even she can talk english. She was  refused by [facility], [facility] and [facility] due to medically unstable BP, heart rate, Increased White cells and UTI with in last 24 hours.    Past Medical History  Diagnosis Date  . Anxiety   . 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 Cardiomyopathy   . Syncope     while walking on treadmill  . LBBB (left bundle branch block)     SEEMS TO BE CHRONIC    Past Surgical History  Procedure Date  . Cesarean section   . Pace maker     Family History  Problem Relation Age of Onset  . Other Other     uncle with pacemaker  . Heart disease Other     PACERMAKER    Social History:  reports that she has never smoked. She has never used smokeless tobacco. She reports that she does not drink alcohol or use illicit drugs.  Allergies:  Allergies  Allergen Reactions  . Desloratadine     Could not sleep.     Medications: I have reviewed the patient's current medications.  Results for orders placed during the hospital encounter of 04/19/12 (from the past 48 hour(s))  GLUCOSE, CAPILLARY     Status: Abnormal   Collection Time   04/21/12  8:10 AM      Component Value Range Comment   Glucose-Capillary 108 (*) 70 - 99 mg/dL   URINALYSIS, ROUTINE W REFLEX MICROSCOPIC     Status: Abnormal   Collection Time   04/21/12  3:48 PM      Component Value Range Comment   Color, Urine YELLOW  YELLOW    APPearance CLOUDY (*) CLEAR    Specific Gravity, Urine 1.024  1.005 - 1.030    pH 6.0  5.0 - 8.0    Glucose, UA NEGATIVE  NEGATIVE mg/dL    Hgb urine dipstick NEGATIVE  NEGATIVE    Bilirubin Urine NEGATIVE  NEGATIVE    Ketones, ur 15 (*) NEGATIVE mg/dL    Protein, ur NEGATIVE  NEGATIVE mg/dL    Urobilinogen, UA 1.0  0.0 - 1.0  mg/dL    Nitrite POSITIVE (*) NEGATIVE    Leukocytes, UA NEGATIVE  NEGATIVE   URINE MICROSCOPIC-ADD ON     Status: Abnormal   Collection Time   04/21/12  3:48 PM      Component Value Range Comment   Squamous Epithelial / LPF RARE  RARE    WBC, UA 0-2  <3 WBC/hpf    Bacteria, UA MANY (*) RARE   GLUCOSE, CAPILLARY     Status: Abnormal   Collection Time   04/21/12  3:51 PM      Component Value Range Comment   Glucose-Capillary 107 (*) 70 - 99 mg/dL    Comment 1 Documented in Chart      Comment 2 Notify RN     CBC WITH DIFFERENTIAL     Status: Abnormal   Collection Time   04/21/12  4:00 PM      Component Value Range Comment   WBC 11.8 (*) 4.0 - 10.5 K/uL    RBC 4.33  3.87 - 5.11 MIL/uL    Hemoglobin 13.1  12.0 - 15.0 g/dL    HCT 16.1  09.6 -  46.0 %    MCV 87.8  78.0 - 100.0 fL    MCH 30.3  26.0 - 34.0 pg    MCHC 34.5  30.0 - 36.0 g/dL    RDW 21.3  08.6 - 57.8 %    Platelets 344  150 - 400 K/uL    Neutrophils Relative 70  43 - 77 %    Neutro Abs 8.3 (*) 1.7 - 7.7 K/uL    Lymphocytes Relative 20  12 - 46 %    Lymphs Abs 2.4  0.7 - 4.0 K/uL    Monocytes Relative 8  3 - 12 %    Monocytes Absolute 0.9  0.1 - 1.0 K/uL    Eosinophils Relative 1  0 - 5 %    Eosinophils Absolute 0.2  0.0 - 0.7 K/uL    Basophils Relative 0  0 - 1 %    Basophils Absolute 0.0  0.0 - 0.1 K/uL   COMPREHENSIVE METABOLIC PANEL     Status: Abnormal   Collection Time   04/21/12  4:00 PM      Component Value Range Comment   Sodium 140  135 - 145 mEq/L    Potassium 3.7  3.5 - 5.1 mEq/L    Chloride 103  96 - 112 mEq/L    CO2 25  19 - 32 mEq/L    Glucose, Bld 109 (*) 70 - 99 mg/dL    BUN 18  6 - 23 mg/dL    Creatinine, Ser 4.69  0.50 - 1.10 mg/dL    Calcium 9.8  8.4 - 62.9 mg/dL    Total Protein 7.6  6.0 - 8.3 g/dL    Albumin 4.0  3.5 - 5.2 g/dL    AST 28  0 - 37 U/L    ALT 20  0 - 35 U/L    Alkaline Phosphatase 75  39 - 117 U/L    Total Bilirubin 0.4  0.3 - 1.2 mg/dL    GFR calc non Af Amer >90  >90  mL/min    GFR calc Af Amer >90  >90 mL/min   AMMONIA     Status: Normal   Collection Time   04/21/12  4:00 PM      Component Value Range Comment   Ammonia 36  11 - 60 umol/L   LACTIC ACID, PLASMA     Status: Normal   Collection Time   04/21/12  4:00 PM      Component Value Range Comment   Lactic Acid, Venous 0.7  0.5 - 2.2 mmol/L   POCT I-STAT, CHEM 8     Status: Abnormal   Collection Time   04/21/12  4:11 PM      Component Value Range Comment   Sodium 141  135 - 145 mEq/L    Potassium 3.7  3.5 - 5.1 mEq/L    Chloride 107  96 - 112 mEq/L    BUN 17  6 - 23 mg/dL    Creatinine, Ser 5.28  0.50 - 1.10 mg/dL    Glucose, Bld 413 (*) 70 - 99 mg/dL    Calcium, Ion 2.44  0.10 - 1.23 mmol/L    TCO2 23  0 - 100 mmol/L    Hemoglobin 13.6  12.0 - 15.0 g/dL    HCT 27.2  53.6 - 64.4 %   POCT I-STAT, CHEM 8     Status: Abnormal   Collection Time   04/21/12  5:47 PM      Component Value  Range Comment   Sodium 141  135 - 145 mEq/L    Potassium 3.8  3.5 - 5.1 mEq/L    Chloride 106  96 - 112 mEq/L    BUN 22  6 - 23 mg/dL    Creatinine, Ser 1.61  0.50 - 1.10 mg/dL    Glucose, Bld 096 (*) 70 - 99 mg/dL    Calcium, Ion 0.45 (*) 1.12 - 1.23 mmol/L    TCO2 25  0 - 100 mmol/L    Hemoglobin 13.3  12.0 - 15.0 g/dL    HCT 40.9  81.1 - 91.4 %   POCT I-STAT TROPONIN I     Status: Normal   Collection Time   04/21/12  5:50 PM      Component Value Range Comment   Troponin i, poc 0.00  0.00 - 0.08 ng/mL    Comment 3            CBC WITH DIFFERENTIAL     Status: Abnormal   Collection Time   04/22/12  6:15 AM      Component Value Range Comment   WBC 14.4 (*) 4.0 - 10.5 K/uL    RBC 4.12  3.87 - 5.11 MIL/uL    Hemoglobin 12.6  12.0 - 15.0 g/dL    HCT 78.2  95.6 - 21.3 %    MCV 88.3  78.0 - 100.0 fL    MCH 30.6  26.0 - 34.0 pg    MCHC 34.6  30.0 - 36.0 g/dL    RDW 08.6  57.8 - 46.9 %    Platelets 312  150 - 400 K/uL    Neutrophils Relative 75  43 - 77 %    Neutro Abs 10.7 (*) 1.7 - 7.7 K/uL     Lymphocytes Relative 17  12 - 46 %    Lymphs Abs 2.5  0.7 - 4.0 K/uL    Monocytes Relative 8  3 - 12 %    Monocytes Absolute 1.1 (*) 0.1 - 1.0 K/uL    Eosinophils Relative 0  0 - 5 %    Eosinophils Absolute 0.0  0.0 - 0.7 K/uL    Basophils Relative 0  0 - 1 %    Basophils Absolute 0.0  0.0 - 0.1 K/uL   COMPREHENSIVE METABOLIC PANEL     Status: Abnormal   Collection Time   04/22/12  6:15 AM      Component Value Range Comment   Sodium 136  135 - 145 mEq/L    Potassium 4.0  3.5 - 5.1 mEq/L    Chloride 98  96 - 112 mEq/L    CO2 21  19 - 32 mEq/L    Glucose, Bld 118 (*) 70 - 99 mg/dL    BUN 24 (*) 6 - 23 mg/dL    Creatinine, Ser 6.29  0.50 - 1.10 mg/dL    Calcium 9.5  8.4 - 52.8 mg/dL    Total Protein 7.2  6.0 - 8.3 g/dL    Albumin 3.7  3.5 - 5.2 g/dL    AST 28  0 - 37 U/L    ALT 19  0 - 35 U/L    Alkaline Phosphatase 75  39 - 117 U/L    Total Bilirubin 0.4  0.3 - 1.2 mg/dL    GFR calc non Af Amer 77 (*) >90 mL/min    GFR calc Af Amer 89 (*) >90 mL/min   URINALYSIS, ROUTINE W REFLEX MICROSCOPIC     Status: Abnormal  Collection Time   04/22/12  7:27 AM      Component Value Range Comment   Color, Urine YELLOW  YELLOW    APPearance CLOUDY (*) CLEAR    Specific Gravity, Urine 1.029  1.005 - 1.030    pH 5.5  5.0 - 8.0    Glucose, UA NEGATIVE  NEGATIVE mg/dL    Hgb urine dipstick SMALL (*) NEGATIVE    Bilirubin Urine NEGATIVE  NEGATIVE    Ketones, ur >80 (*) NEGATIVE mg/dL    Protein, ur 30 (*) NEGATIVE mg/dL    Urobilinogen, UA 0.2  0.0 - 1.0 mg/dL    Nitrite POSITIVE (*) NEGATIVE    Leukocytes, UA SMALL (*) NEGATIVE   URINE MICROSCOPIC-ADD ON     Status: Abnormal   Collection Time   04/22/12  7:27 AM      Component Value Range Comment   Squamous Epithelial / LPF RARE  RARE    WBC, UA 3-6  <3 WBC/hpf    RBC / HPF 0-2  <3 RBC/hpf    Bacteria, UA MANY (*) RARE    Casts HYALINE CASTS (*) NEGATIVE    Urine-Other MUCOUS PRESENT       Dg Chest 2 View  04/21/2012  *RADIOLOGY  REPORT*  Clinical Data: Altered mental status  CHEST - 2 VIEW  Comparison: 04/13/2012  Findings: There is a left chest wall pacer device with lead in the right atrial appendage and right ventricle and coronary sinus.  The heart size appears normal.  There is no pleural effusion or edema. No airspace consolidation. Small granuloma noted within the right upper lobe.  The visualized bony structures appear intact.  IMPRESSION:  1.  No acute cardiopulmonary abnormalities noted.   Original Report Authenticated By: Rosealee Albee, M.D.    Ct Head Wo Contrast  04/21/2012  *RADIOLOGY REPORT*  Clinical Data: Altered mental status.  CT HEAD WITHOUT CONTRAST  Technique:  Contiguous axial images were obtained from the base of the skull through the vertex without contrast.  Comparison: None.  Findings: No acute intracranial abnormality.  Specifically, no hemorrhage, hydrocephalus, mass lesion, acute infarction, or significant intracranial injury.  No acute calvarial abnormality. Visualized paranasal sinuses and mastoids clear.  Orbital soft tissues unremarkable.  IMPRESSION: Normal study.   Original Report Authenticated By: Cyndie Chime, M.D.     Positive for altered mental status. Blood pressure 123/83, pulse 92, temperature 98.8 F (37.1 C), temperature source Oral, resp. rate 26, last menstrual period 02/21/2012, SpO2 97.00%.   Assessment/Plan: Altered mental status Psychosis NOS Hx of recent pacemaker UTI   Recommended medical admit with psych consult. Patient was refused by [facility], [facility] and [facility] due to medically unstable BP, heart rate, Increased White cells and UTI.   Nancy Drake,[name] 04/22/2012, 11:13 AM

## 2012-04-22 NOTE — ED Provider Notes (Signed)
The patient is now awake alert cooperative still anxious but denies threats or herself or others she denies suicidal homicidal ideation she denies current hallucinations. She thinks she remembers hallucinating recently but does not know exactly when. She is now oriented to person place and time. She has chronic abdominal pain which she states has not changed since 1997. Her abdomen is minimally tender which patient states is baseline there is no rebound tenderness. Apparently she presented nonverbal and uncooperative to the emergency department but is now been here about 72 hours and appears much improved. She has a permanent pacemaker and her pulse rate is 99, her blood pressure is normal, she has a questionable urinary tract infection and is on antibiotics for that. I have requested a telemetry psychiatry consultation for the patient said she now appears much improved. I am uncertain at this point if she still requires hospitalization for behavioral health concerns and await psychiatry recommendations.[id number]  Hurman Horn, MD 04/22/12 2253

## 2012-04-22 NOTE — ED Notes (Addendum)
Pt offered water, pt drank 12 oz of water. Pt now sitting up talking in spanish. Translation line called. Pt talking to interpreter, states she is hearing voices of her daughter who died 16 years ago. Pt states she can speak english but continued to speak in spanish. Pt given crackers and peanut butter. Pt sitting in bed talking out loud in spanish with her eyes closed

## 2012-04-22 NOTE — ED Notes (Signed)
Pt ambulated with steady gait to bathroom with one assist

## 2012-04-22 NOTE — ED Notes (Signed)
Pt aware that urine sample is needed. Pt not able to provide sample at this time

## 2012-04-22 NOTE — ED Notes (Signed)
Pt continues to be non-verbal and non-interactive with staff. Pt offered water, pt not responsive to offer of water

## 2012-04-25 ENCOUNTER — Telehealth: Payer: Self-pay | Admitting: Cardiology

## 2012-04-25 NOTE — Telephone Encounter (Signed)
New Problem:    Patient called in wanting to know if she would be able to work after having her device implanted.  Please call back.

## 2012-04-25 NOTE — Telephone Encounter (Signed)
I will forward to Heather/ Dr Klein/triage for further review.

## 2012-04-25 NOTE — Telephone Encounter (Signed)
I will forward to  Device clinic and ask them to call pt to schedule wound check.

## 2012-04-25 NOTE — Telephone Encounter (Signed)
LMTCB. Looks like she is to follow-up with Dr Graciela Husbands. It looks like she missed her wound check appt.

## 2012-04-25 NOTE — Telephone Encounter (Signed)
plz schedle wound check steve

## 2012-05-04 ENCOUNTER — Encounter: Payer: Self-pay | Admitting: Internal Medicine

## 2012-05-04 ENCOUNTER — Ambulatory Visit (INDEPENDENT_AMBULATORY_CARE_PROVIDER_SITE_OTHER): Payer: BC Managed Care – PPO | Admitting: *Deleted

## 2012-05-04 DIAGNOSIS — I442 Atrioventricular block, complete: Secondary | ICD-10-CM

## 2012-05-04 DIAGNOSIS — I5022 Chronic systolic (congestive) heart failure: Secondary | ICD-10-CM

## 2012-05-04 LAB — PACEMAKER DEVICE OBSERVATION
AL IMPEDENCE PM: 361 Ohm
BAMS-0001: 170 {beats}/min
BATTERY VOLTAGE: 3.0788 V
LV LEAD THRESHOLD: 1.375 V
VENTRICULAR PACING PM: 99.57

## 2012-05-04 NOTE — Progress Notes (Signed)
Wound check-PPM 

## 2012-05-24 ENCOUNTER — Telehealth: Payer: Self-pay | Admitting: Cardiology

## 2012-05-24 ENCOUNTER — Telehealth: Payer: Self-pay | Admitting: Internal Medicine

## 2012-05-24 NOTE — Telephone Encounter (Signed)
I spoke with the patient. She had her PPM placed in August. She states she has been walking and doing yoga, but she feels weird when she goes to the gym. She reports that she notices a "ticking" sensation around the device itself. I explained I am not certain what this is coming from. I advised her I will forward to device clinic to see if they will call her and assess what she is feeling. She is agreeable. She would also like a note from [place] stating she should not go back to the gym due to multiple hospital visits she has had. All visits were not cardiac related. I explained I would forward this to Dr. Shirlee Latch (her primary cardiologist) and Dr. Graciela Husbands. I explained if Dr. Shirlee Latch wants Dr. Graciela Husbands to do a note, she may have to wait until next week since Dr. Graciela Husbands is out of the office.

## 2012-05-24 NOTE — Telephone Encounter (Signed)
Plz return call to patient [phone], pacer placed 8/12  She has questions about medical restrictions.

## 2012-05-24 NOTE — Telephone Encounter (Signed)
Spoke w/pt in regards to ticking. Pt more concerned with note for not being able to go to gym. Pt is scheduled to see Dr Graciela Husbands on 07-19-12 and will talk with dr then. Per pt thinks she is getting used to device. Offered pt sooner appt for device check and that was declined.

## 2012-05-24 NOTE — Telephone Encounter (Signed)
The patient is wanting for either Dr. Graciela Husbands or Dr. Shirlee Latch to do a letter for her to go to her gym. She wants this to state that she should not be working out at her gym due to the multiple hospitalizations she has had over the summer due to various medical conditions. I advised the patient I am not certain either physician will do this for her, but I would forward to both MD's for review. She was made aware that if Dr. Shirlee Latch will not do her note, that Dr. Graciela Husbands will not be back in the office until next week to review.

## 2012-05-24 NOTE — Telephone Encounter (Signed)
LMOM/kwm  

## 2012-05-24 NOTE — Telephone Encounter (Signed)
Patients father has an appointment tomorrow morning. I was calling to remind her of his appointment. She confirmed.   Patient states that she is very upset with Dr. Drue Novel and the physicians at the hospital regarding her heart attack that she had. She said that she wanted Dr. Drue Novel to look at her chart when she is there tomorrow at her fathers appointment. I explained to her that tomorrows visit would be just for her father. That she would need to schedule an appointment herself with Dr. Drue Novel.   When can I schedule this post hospital follow up for this patient?

## 2012-05-24 NOTE — Telephone Encounter (Signed)
Pt rtn call to [facility]

## 2012-05-24 NOTE — Telephone Encounter (Signed)
I agree, tomorrow's time needs to be spent on her father. Arrange a followup at her earliest convenience

## 2012-05-24 NOTE — Telephone Encounter (Signed)
Please advise 

## 2012-05-25 NOTE — Telephone Encounter (Signed)
Spoke with pt she stated she was in the office and no longer wants to follow up wt/dr.paz on this issue as she has already seen to many other drs.

## 2012-05-25 NOTE — Telephone Encounter (Signed)
Next available 30 minute slot is 10.9.13 do you want to see her before then? If so where would you like her on your schedule?

## 2012-05-25 NOTE — Telephone Encounter (Signed)
noted 

## 2012-05-30 ENCOUNTER — Telehealth: Payer: Self-pay | Admitting: Internal Medicine

## 2012-05-30 ENCOUNTER — Encounter: Payer: Self-pay | Admitting: Internal Medicine

## 2012-05-30 NOTE — Telephone Encounter (Signed)
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 Also checking on letter to the gym stating she hasn't been able to go since June, although she said she was told ok to go back with a 10lb weight limit, but doesn't trust this, said we had her wrong dob and she had to be opened back up two days after implant so having trust issues,  pls call [phone]

## 2012-05-30 NOTE — Telephone Encounter (Signed)
Letter written

## 2012-05-30 NOTE — Telephone Encounter (Signed)
I spoke with the patient and made her aware that her letter has been done by Dr. Graciela Husbands. She would like to pick this up. The letter has been read to her and a copy placed at the front desk.

## 2012-05-31 DIAGNOSIS — Z0279 Encounter for issue of other medical certificate: Secondary | ICD-10-CM

## 2012-06-01 ENCOUNTER — Ambulatory Visit (INDEPENDENT_AMBULATORY_CARE_PROVIDER_SITE_OTHER): Payer: BC Managed Care – PPO | Admitting: *Deleted

## 2012-06-01 DIAGNOSIS — I428 Other cardiomyopathies: Secondary | ICD-10-CM

## 2012-06-01 NOTE — Progress Notes (Signed)
Patient was concerned about an open area on the distal end of her incision that she noted last week.  Today the incision appears to be healing well without any open areas.  I also addressed her questions about exercise and shoulder soreness.   She will follow up as scheduled.

## 2012-06-07 ENCOUNTER — Ambulatory Visit (INDEPENDENT_AMBULATORY_CARE_PROVIDER_SITE_OTHER): Payer: BC Managed Care – PPO | Admitting: Internal Medicine

## 2012-06-07 VITALS — BP 118/72 | HR 79 | Temp 97.6°F | Wt 130.0 lb

## 2012-06-07 DIAGNOSIS — F411 Generalized anxiety disorder: Secondary | ICD-10-CM

## 2012-06-07 DIAGNOSIS — Z0000 Encounter for general adult medical examination without abnormal findings: Secondary | ICD-10-CM

## 2012-06-07 DIAGNOSIS — I428 Other cardiomyopathies: Secondary | ICD-10-CM

## 2012-06-07 NOTE — Assessment & Plan Note (Signed)
F/u by psychiatry

## 2012-06-07 NOTE — Patient Instructions (Addendum)
Please come back fasting: FLP, CBC-- dx  Cardiomyopathy ---- Please come back for a physical in 1 year

## 2012-06-07 NOTE — Progress Notes (Signed)
  Subjective:    Patient ID: Nancy Drake, female    DOB: [birth year], 51 y.o.   MRN: [id number]  HPI Routine office visit, since the last time she was here she had complete heart block and had a pacemaker. Chart is reviewed. Currently doing well.   Past Medical History: 10-2010 syncope--->ECHO 10-2010, cath 10-2010 10-2010 dx with Dilated cardiomyopathy with initial  EF ~ 30% by echo 03-2012, complete heart block, status post pacemaker Anxiety, lost a child , sees psych Schizophrenia not on BCP thyroid dz? getting checked at Dr Talmage Nap  (3-11)  Past Surgical History: c-section Pacemaker insertion, 03-2012 Family History: CAD--no DM-- +  Social History: born in [place],   divorced , lives w/ father, 2 child, lost one   tobacco-- no ETOH-- socially   Review of Systems Denies substernal chest pain, no palpitations, no syncope. Occasionally gets slightly short of breath with exertion. As far as her mood, she continued to be anxious, no depression per se according to the patient.     Objective:   Physical Exam General -- alert, well-developed, and well-nourished.   Lungs -- normal respiratory effort, no intercostal retractions, no accessory muscle use, and normal breath sounds.   Heart-- normal rate, regular rhythm, no murmur, and no gallop.   Extremities-- no pretibial edema bilaterally  Psych-- Cognition and judgment appear intact. Alert and cooperative with normal attention span and concentration.  not anxious appearing and not depressed appearing.        Assessment & Plan:

## 2012-06-07 NOTE — Assessment & Plan Note (Addendum)
Status post complete heart block 03-2012, now has a pacemaker in, she seems to be doing well,  asymptomatic. Recommend to keep her appointment with cardiology. Also, hospital labs are reviewed, we'll check FLP and CBC

## 2012-06-07 NOTE — Assessment & Plan Note (Signed)
Sees gynecology routinely. Had a flu shot recently Recommend to come back in one year for a complete physical (female care continue to be by gynecology)

## 2012-06-08 ENCOUNTER — Other Ambulatory Visit (INDEPENDENT_AMBULATORY_CARE_PROVIDER_SITE_OTHER): Payer: BC Managed Care – PPO

## 2012-06-08 ENCOUNTER — Encounter: Payer: Self-pay | Admitting: Internal Medicine

## 2012-06-08 DIAGNOSIS — I429 Cardiomyopathy, unspecified: Secondary | ICD-10-CM

## 2012-06-08 DIAGNOSIS — I428 Other cardiomyopathies: Secondary | ICD-10-CM

## 2012-06-08 LAB — CBC WITH DIFFERENTIAL/PLATELET
Eosinophils Absolute: 0.1 10*3/uL (ref 0.0–0.7)
Eosinophils Relative: 1.5 % (ref 0.0–5.0)
HCT: 39.3 % (ref 36.0–46.0)
Lymphs Abs: 1.9 10*3/uL (ref 0.7–4.0)
MCHC: 32.7 g/dL (ref 30.0–36.0)
MCV: 91.7 fl (ref 78.0–100.0)
Monocytes Absolute: 0.6 10*3/uL (ref 0.1–1.0)
Platelets: 284 10*3/uL (ref 150.0–400.0)
RDW: 13.1 % (ref 11.5–14.6)

## 2012-06-08 LAB — LIPID PANEL
Cholesterol: 178 mg/dL (ref 0–200)
HDL: 49.9 mg/dL (ref >39.00)
Triglycerides: 65 mg/dL (ref 0.0–149.0)

## 2012-06-13 ENCOUNTER — Encounter: Payer: Self-pay | Admitting: *Deleted

## 2012-07-13 ENCOUNTER — Telehealth: Payer: Self-pay

## 2012-07-13 NOTE — Telephone Encounter (Signed)
Patient came in to the clinic today wanting to be seen by Dr. Graciela Husbands earlier than her scheduled appointment of Nov. 19, 2013. Patient denies pain, pressure or shortness of breath presently. She states she feels anxious and wants to get help. She states she has a history of anxiety and feels very anxious about information she has been given. "I feel confused about whether I can go to the gym and get massages or not." I encouraged the patient to keep her appt. With Dr. Julieanne Cotton and that we need to set her up with Cardiac Rehab. To help her with her limitations and physical activity level. Pt. wants to be called back on this number [phone]. I will forward this to Dr. Graciela Husbands.

## 2012-07-18 ENCOUNTER — Encounter: Payer: Self-pay | Admitting: Internal Medicine

## 2012-07-18 NOTE — Telephone Encounter (Signed)
Spoke with pt will see in am

## 2012-07-19 ENCOUNTER — Encounter: Payer: Self-pay | Admitting: Internal Medicine

## 2012-07-19 ENCOUNTER — Ambulatory Visit (INDEPENDENT_AMBULATORY_CARE_PROVIDER_SITE_OTHER): Payer: BC Managed Care – PPO | Admitting: Internal Medicine

## 2012-07-19 VITALS — BP 112/96 | HR 78 | Resp 16 | Ht 61.0 in | Wt 127.0 lb

## 2012-07-19 DIAGNOSIS — I428 Other cardiomyopathies: Secondary | ICD-10-CM

## 2012-07-19 DIAGNOSIS — I442 Atrioventricular block, complete: Secondary | ICD-10-CM

## 2012-07-19 DIAGNOSIS — I5022 Chronic systolic (congestive) heart failure: Secondary | ICD-10-CM

## 2012-07-19 DIAGNOSIS — Z950 Presence of cardiac pacemaker: Secondary | ICD-10-CM | POA: Insufficient documentation

## 2012-07-19 LAB — PACEMAKER DEVICE OBSERVATION
AL AMPLITUDE: 3.3 mv
AL IMPEDENCE PM: 437 Ohm
BAMS-0001: 170 {beats}/min
BATTERY VOLTAGE: 3.04 V
LV LEAD THRESHOLD: 1.25 V

## 2012-07-19 NOTE — Patient Instructions (Signed)
Your physician recommends that you schedule a follow-up appointment in: 3 months with Dr. Graciela Husbands.  Your physician has requested that you have an echocardiogram in 3 months. Echocardiography is a painless test that uses sound waves to create images of your heart. It provides your doctor with information about the size and shape of your heart and how well your heart's chambers and valves are working. This procedure takes approximately one hour. There are no restrictions for this procedure.

## 2012-07-19 NOTE — Assessment & Plan Note (Signed)
Current medications her asymptomatic left ventricular dysfunction

## 2012-07-19 NOTE — Progress Notes (Addendum)
Patient Care Team: Wanda Plump, MD as PCP - General Archer Asa, MD (Psychiatry)   HPI  Nancy Drake is a 51 y.o. female Seen following pacemaker implantation 8/13 for Leim Fabry attacks with left bundle branch block and cardiomyopathy and she underwent CRT P. Echocardiogram October 2012 EF 45-50%  She is currently taking beta blockers and ACE inhibitors. She has gotten his confusing messages from our office regarding activities. This has been quite frustrating for her.  She has some tenderness at her device site. She's had no recurrent syncope  Past Medical History  Diagnosis Date  . Anxiety   . 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 Cardiomyopathy nonischemic   . Syncope     while walking on treadmill  . LBBB (left bundle branch block)   . Biventricular cardiac pacemaker -[employer]     DOI 2013    Past Surgical History  Procedure Date  . Cesarean section   . Pace maker     Current Outpatient Prescriptions  Medication Sig Dispense Refill  . acetaminophen (TYLENOL) 500 MG tablet Take 500 mg by mouth daily.      Marland Kitchen ALPRAZolam (XANAX) 1 MG tablet Take 0.5-1 mg by mouth at bedtime as needed. For anxiety      . carvedilol (COREG) 12.5 MG tablet Take 12.5 mg by mouth 2 (two) times daily with a meal.      . Cyanocobalamin (VITAMIN B-12 PO) Take 1 tablet by mouth daily.      . fish oil-omega-3 fatty acids 1000 MG capsule Take 2 g by mouth daily.      Marland Kitchen lisinopril (PRINIVIL,ZESTRIL) 5 MG tablet Take 5 mg by mouth 2 (two) times daily.      . Multiple Vitamin (MULTIVITAMIN WITH MINERALS) TABS Take 1 tablet by mouth daily.      . risperiDONE (RISPERDAL) 0.5 MG tablet Take 1 mg by mouth daily.         Allergies  Allergen Reactions  . Desloratadine     Could not sleep.     Review of Systems negative except from HPI and PMH  Physical Exam BP 112/96  Pulse 78  Resp 16  Ht 5\' 1"  (1.549 m)  Wt 127 lb (57.607 kg)  BMI 24.00 kg/m2 Well developed and well  nourished in no acute distress HENT normal E scleral and icterus clear Neck Supple JVP flat; carotids brisk and full Clear to ausculation Device pocket well healed; without hematoma or erythema Regular rate and rhythm, no murmurs gallops or rub Soft with active bowel sounds No clubbing cyanosis none Edema Alert and oriented, grossly normal motor and sensory function Skin Warm and Dry    Assessment and  Plan   Pt apparently does not have schizophreiform disorder as noted in the past medical history

## 2012-07-19 NOTE — Assessment & Plan Note (Signed)
The patient's device was interrogated and the information was fully reviewed.  The device was reprogrammed to  maiximmize longevity

## 2012-08-17 ENCOUNTER — Other Ambulatory Visit: Payer: Self-pay | Admitting: Cardiovascular Disease

## 2012-08-17 NOTE — Telephone Encounter (Signed)
Refilled Lisinopril. 

## 2012-09-02 ENCOUNTER — Telehealth: Payer: Self-pay | Admitting: Internal Medicine

## 2012-09-02 ENCOUNTER — Other Ambulatory Visit: Payer: Self-pay | Admitting: *Deleted

## 2012-09-02 DIAGNOSIS — R55 Syncope and collapse: Secondary | ICD-10-CM

## 2012-09-02 DIAGNOSIS — I509 Heart failure, unspecified: Secondary | ICD-10-CM

## 2012-09-02 NOTE — Telephone Encounter (Signed)
robitussin DM syrup is okay. Follow box instructions

## 2012-09-02 NOTE — Telephone Encounter (Signed)
Please advise 

## 2012-09-02 NOTE — Telephone Encounter (Signed)
Ordered referral in EPIC for cardiac rehab and form faxed.

## 2012-09-02 NOTE — Telephone Encounter (Signed)
Patient Information:  Caller Name: Paticia  Phone: [phone]  Patient: Nancy Drake, Nancy Drake  Gender: Female  DOB: 06-Nov-1960  Age: 52 Years  PCP: Willow Ora  Pregnant: No  Office Follow Up:  Does the office need to follow up with this patient?: Yes  Instructions For The Office: PATIENT CONCERNED ABOUT OTC COUGH PREPARATIONS. WOULD LIKE RECOMMENDATIONS FROM PHYSICIAN. HX OF PACEMAKER PLACED IN AUGUST WITH CARDIOMYOPATHY. ON ACE INHIBITORS AND BETA BLOCKERS  RN Note:  PLEASE ADVISE WHAT MEDICATION SHE CAN TAKE FOR COUGH DUE TO PACEMAKER AND CARDIOMYOPATHY. PATIENT IS CONCERNED ABOUT BRONCHITIS. SHE WAS GIVEN AN APPT FOR MONDAY BY THE OFFICE . REVIEWED HOME CARE INSTRUCTIONS AND CALL BACK PARMETERS PER GUIDELINES.  UNDERSTANDING EXPRESSED.  Symptoms  Reason For Call & Symptoms: Patient states she has been coughing and wants to know what she can take OTC for the cough.  Onset of cough 08/31/12. productive white. congestion and runny nose white/clear.  Afebrile.  She states she had cardiomyopathy with pacemaker placement 04/11/12 and is concerned about OTC medications.  She has appt Monday 09/05/12  Reviewed Health History In EMR: Yes  Reviewed Medications In EMR: Yes  Reviewed Allergies In EMR: Yes  Reviewed Surgeries / Procedures: No  Date of Onset of Symptoms: 08/30/2012  Treatments Tried: lemon tea and honey, Tylenol  Treatments Tried Worked: Yes OB / GYN:  LMP: Unknown  Guideline(s) Used:  Cough  Disposition Per Guideline:   See Within 3 Days in Office  Reason For Disposition Reached:   Taking an ACE Inhibitor medication (e.g., benazepril/LOTENSIN, captopril/CAPOTEN, enalapril/VASOTEC, lisinopril/ZESTRIL)  Advice Given:  Reassurance  Coughing is the way that our lungs remove irritants and mucus. It helps protect our lungs from getting pneumonia.  You can get a dry hacking cough after a chest cold. Sometimes this type of cough can last 1-3 weeks, and be worse at night.  You  can also get a cough after being exposed to irritating substances like smoke, strong perfumes, and dust.  Here is some care advice that should help.  Cough Medicines:  OTC Cough Syrups: The most common cough suppressant in OTC cough medications is dextromethorphan. Often the letters "DM" appear in the name.  OTC Cough Drops: Cough drops can help a lot, especially for mild coughs. They reduce coughing by soothing your irritated throat and removing that tickle sensation in the back of the throat. Cough drops also have the advantage of portability - you can carry them with you.  Home Remedy - Hard Candy: Hard candy works just as well as medicine-flavored OTC cough drops. Diabetics should use sugar-free candy.  Home Remedy - Honey: This old home remedy has been shown to help decrease coughing at night. The adult dosage is 2 teaspoons (10 ml) at bedtime. Honey should not be given to infants under one year of age.  OTC Cough Syrup - Dextromethorphan:  Cough syrups containing the cough suppressant dextromethorphan (DM) may help decrease your cough. Cough syrups work best for coughs that keep you awake at night. They can also sometimes help in the late stages of a respiratory infection when the cough is dry and hacking. They can be used along with cough drops.  Examples: Benylin, Robitussin DM, Vicks 44 Cough Relief  Read the package instructions for dosage, contraindications, and other important information.  Caution - Dextromethorphan:   Do not try to completely suppress coughs that produce mucus and phlegm. Remember that coughing is helpful in bringing up mucus from the lungs and preventing  pneumonia.  Research Notes: Dextromethorphan in some research studies has been shown to reduce the frequency and severity of cough in adults (18 years or older) without significant adverse effects. However, other studies suggest that dextromethorphan is no better than placebo at reducing a cough.  Drug Abuse Potential:  It should be noted that dextromethorphan has become a drug of abuse. This problem is seen most often in adolescents. Overdose symptoms can range from giggling and euphoria to hallucinations and coma.  CONTRAINDICATED: Do not take dextromethorphan if you are taking a monoamine oxidase (MAO) inhibitor now or in the past 2 weeks. Examples of MAO inhibitors include isocarboxazid (Marplan), phenelzine (Nardil), selegiline (Eldepryl, Emsam, Zelapar), and tranylcypromine (Parnate). Do not take dextromethorphan if you are taking venlafaxine (Effexor).  Coughing Spasms:  Drink warm fluids. Inhale warm mist (Reason: both relax the airway and loosen up the phlegm).  Suck on cough drops or hard candy to coat the irritated throat.  Prevent Dehydration:  Drink adequate liquids.  This will help soothe an irritated or dry throat and loosen up the phlegm.  Avoid Tobacco Smoke:  Smoking or being exposed to smoke makes coughs much worse.  Avoid Tobacco Smoke:  Smoking or being exposed to smoke makes coughs much worse.  Call Back If:  Difficulty breathing  Cough lasts more than 3 weeks  Fever lasts > 3 days  You become worse.

## 2012-09-02 NOTE — Telephone Encounter (Signed)
Discussed with pt

## 2012-09-02 NOTE — Telephone Encounter (Signed)
New problem:    Status of referral to [place] rehab.

## 2012-09-05 ENCOUNTER — Ambulatory Visit: Payer: BC Managed Care – PPO | Admitting: Internal Medicine

## 2012-09-13 NOTE — Telephone Encounter (Signed)
Per Byrd Hesselbach, she will be faxing over a medicaid order for Dr. Graciela Husbands to sign.

## 2012-09-13 NOTE — Telephone Encounter (Signed)
I called cardiac rehab to follow up. Per Liborio Nixon at [facility] cardiac rehab, she will check with Byrd Hesselbach on this and call back.

## 2012-09-13 NOTE — Telephone Encounter (Signed)
F/u   Status of appt for cardiac rehab.

## 2012-09-16 NOTE — Telephone Encounter (Signed)
(  continued) I have left a message for the patient to call. I have also spoken with Byrd Hesselbach at [facility] cardiac rehab. She stated to find out if the patient is employed. If not, she can mail her paperwork for to apply for financial assistance. Otherwise, the patient can find out from [facility] what they will pay for her diagnosis. She would be responsible for the remainder as medicaid will not pay.

## 2012-09-16 NOTE — Telephone Encounter (Signed)
Dr. Graciela Husbands reviewed the Medicaid form for cardiac rehab. There is no qualifying "high risk" category to complete for the patient. Her discharge summary notes an EF of 30%, but on previous testing, it is documented at 40-45%

## 2012-09-19 NOTE — Telephone Encounter (Signed)
New Problem:    Patient called in to state that she is employed but only made $5000.  Patient is covered by [facility] and will call to find what they would be covering.  Patient would like to have financial assistance paperwork mailed to her.  Patient would like to know if she is allowed to swim.  Please call back.

## 2012-09-19 NOTE — Telephone Encounter (Signed)
LMTCB

## 2012-09-23 ENCOUNTER — Telehealth: Payer: Self-pay | Admitting: Internal Medicine

## 2012-09-23 NOTE — Telephone Encounter (Signed)
Patient called regarding concern about price of cardiac rehab.  States she has a $2,500.00 deductible and is wondering if there was anywhere that she could go that would be cheaper.  I advised her I would ask Dr. Odessa Fleming nurse to call her back and discuss options with her.  She is aware Herbert Seta will be in the office on Tuesday and understood it will be next week when she receives a return call.

## 2012-09-27 NOTE — Telephone Encounter (Signed)
I spoke with Byrd Hesselbach. She will contact the patient and speak to her regarding her options.

## 2012-09-27 NOTE — Telephone Encounter (Signed)
I left a message for Nancy Drake in Cardiac Rehab to call me about this because I do not know if cost varies by facility for the same service.

## 2012-10-11 ENCOUNTER — Telehealth: Payer: Self-pay | Admitting: Internal Medicine

## 2012-10-11 NOTE — Telephone Encounter (Signed)
I spoke with the patient. She was wanting to know if an outpatient clinic in [facility] could do her cardiac rehab. She states this is where she took her mother for PT. I explained that I have never referred anyone to cardiac rehab at a clinic and this may be specific for PT not cardiac rehab. The patient voices understanding. She has an appointment for an echo tomorrow and with Dr. Graciela Husbands on 2/19.

## 2012-10-11 NOTE — Telephone Encounter (Signed)
I left a message for the patient to call. 

## 2012-10-11 NOTE — Telephone Encounter (Signed)
New Problem:     Patient called in wanting to speak with you about her insurance covering her in office exercises for her left side due to her device implant.  Please call back.

## 2012-10-12 ENCOUNTER — Other Ambulatory Visit (HOSPITAL_COMMUNITY): Payer: BC Managed Care – PPO

## 2012-10-12 ENCOUNTER — Ambulatory Visit (HOSPITAL_COMMUNITY): Payer: BC Managed Care – PPO | Attending: Internal Medicine | Admitting: Radiology

## 2012-10-12 DIAGNOSIS — I5022 Chronic systolic (congestive) heart failure: Secondary | ICD-10-CM

## 2012-10-12 DIAGNOSIS — I379 Nonrheumatic pulmonary valve disorder, unspecified: Secondary | ICD-10-CM | POA: Insufficient documentation

## 2012-10-12 DIAGNOSIS — I509 Heart failure, unspecified: Secondary | ICD-10-CM

## 2012-10-12 DIAGNOSIS — I442 Atrioventricular block, complete: Secondary | ICD-10-CM

## 2012-10-12 DIAGNOSIS — Z950 Presence of cardiac pacemaker: Secondary | ICD-10-CM | POA: Insufficient documentation

## 2012-10-12 DIAGNOSIS — I080 Rheumatic disorders of both mitral and aortic valves: Secondary | ICD-10-CM | POA: Insufficient documentation

## 2012-10-12 DIAGNOSIS — I428 Other cardiomyopathies: Secondary | ICD-10-CM | POA: Insufficient documentation

## 2012-10-12 DIAGNOSIS — I079 Rheumatic tricuspid valve disease, unspecified: Secondary | ICD-10-CM | POA: Insufficient documentation

## 2012-10-12 NOTE — Progress Notes (Signed)
Echocardiogram performed.  

## 2012-10-19 ENCOUNTER — Encounter: Payer: Self-pay | Admitting: Internal Medicine

## 2012-10-19 ENCOUNTER — Ambulatory Visit (INDEPENDENT_AMBULATORY_CARE_PROVIDER_SITE_OTHER): Payer: BC Managed Care – PPO | Admitting: Internal Medicine

## 2012-10-19 VITALS — BP 112/61 | HR 75 | Ht 61.5 in | Wt 121.4 lb

## 2012-10-19 DIAGNOSIS — I428 Other cardiomyopathies: Secondary | ICD-10-CM

## 2012-10-19 DIAGNOSIS — F411 Generalized anxiety disorder: Secondary | ICD-10-CM

## 2012-10-19 DIAGNOSIS — Z950 Presence of cardiac pacemaker: Secondary | ICD-10-CM

## 2012-10-19 NOTE — Progress Notes (Signed)
.  kf Patient Care Team: Wanda Plump, MD as PCP - General Archer Asa, MD (Psychiatry)   HPI  Nancy Drake is a 52 y.o. female Seen in followup for nonischemic cardiomyopathy with left bundle branch block and syncope. She is status post CRT P. Implantation. She is on ACE inhibitors and beta blockers. Her ejection fraction remains modestly reduced at 40-45% confirmed by echo 2/14 a  She  Had an episode of dizziness one sat in [place] and ended up in altercation with boss, was fired, police were called and subsequently EMS .   She thinks largeley related to anxiety   Past Medical History  Diagnosis Date  . Anxiety   . 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 Cardiomyopathy nonischemic   . Syncope     while walking on treadmill  . LBBB (left bundle branch block)   . Biventricular cardiac pacemaker -Medtronic     DOI 2013    Past Surgical History  Procedure Laterality Date  . Cesarean section    . Pace maker      Current Outpatient Prescriptions  Medication Sig Dispense Refill  . acetaminophen (TYLENOL) 500 MG tablet Take 500 mg by mouth daily.      Marland Kitchen ALPRAZolam (XANAX) 1 MG tablet Take 0.5-1 mg by mouth at bedtime as needed. For anxiety      . carvedilol (COREG) 12.5 MG tablet Take 12.5 mg by mouth 2 (two) times daily with a meal.      . Cyanocobalamin (VITAMIN B-12 PO) Take 1 tablet by mouth daily.      . fish oil-omega-3 fatty acids 1000 MG capsule Take 2 g by mouth daily.      Marland Kitchen lisinopril (PRINIVIL,ZESTRIL) 5 MG tablet Take 5 mg by mouth 2 (two) times daily.      . Multiple Vitamin (MULTIVITAMIN WITH MINERALS) TABS Take 1 tablet by mouth daily.      . risperiDONE (RISPERDAL) 0.5 MG tablet Take 1 mg by mouth daily.        No current facility-administered medications for this visit.    Allergies  Allergen Reactions  . Desloratadine     Could not sleep.     Review of Systems negative except from HPI and PMH  Physical Exam BP 112/61  Pulse 75  Ht 5' 1.5"  (1.562 m)  Wt 121 lb 6.4 oz (55.067 kg)  BMI 22.57 kg/m2 Well developed and well nourished in no acute distress HENT normal E scleral and icterus clear Neck Supple JVP flat; carotids brisk and full Clear to ausculation   Device pocket well healed; without hematoma or erythema Regular rate and rhythm, no murmurs gallops or rub Soft with active bowel sounds No clubbing cyanosis none Edema Alert and oriented, grossly normal motor and sensory function Skin Warm and Dry    Assessment and  Plan

## 2012-10-19 NOTE — Assessment & Plan Note (Signed)
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 She asked if I thought or safe for her to go to [place]; I do.

## 2012-10-19 NOTE — Assessment & Plan Note (Signed)
We'll continue her on beta blockers and ACE inhibitors which she is tolerating

## 2012-10-19 NOTE — Assessment & Plan Note (Signed)
Stable The patient's device was interrogated.  The information was reviewed. No changes were made in the programming.    

## 2012-10-20 ENCOUNTER — Other Ambulatory Visit: Payer: Self-pay | Admitting: *Deleted

## 2012-10-20 MED ORDER — CARVEDILOL 12.5 MG PO TABS: 12.5000 mg | ORAL_TABLET | Freq: Two times a day (BID) | ORAL | Status: DC

## 2012-10-20 MED ORDER — LISINOPRIL 5 MG PO TABS: 5.0000 mg | ORAL_TABLET | Freq: Two times a day (BID) | ORAL | Status: DC

## 2012-11-01 ENCOUNTER — Telehealth: Payer: Self-pay | Admitting: *Deleted

## 2012-11-01 ENCOUNTER — Encounter: Payer: Self-pay | Admitting: Internal Medicine

## 2012-11-01 NOTE — Telephone Encounter (Signed)
Pt called stating that she needs a letter from Dr. Drue Novel stating that he removed the diagnosis off of her med list. Please advise.

## 2012-11-01 NOTE — Telephone Encounter (Signed)
I will write a letter stating that I have removed the diagnosis of schizophrenia.

## 2012-11-02 ENCOUNTER — Telehealth: Payer: Self-pay | Admitting: Internal Medicine

## 2012-11-02 NOTE — Telephone Encounter (Signed)
Attempted to call patient again. LMTCB 11/12/12

## 2012-11-02 NOTE — Telephone Encounter (Signed)
LMTCB 11/02/12

## 2012-11-02 NOTE — Telephone Encounter (Signed)
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

## 2012-11-02 NOTE — Telephone Encounter (Signed)
New Problem:    Patient called in returning your call. Please call back. 

## 2012-11-14 ENCOUNTER — Telehealth: Payer: Self-pay | Admitting: Internal Medicine

## 2012-11-14 NOTE — Telephone Encounter (Addendum)
Discussed with dr Graciela Husbands, he would like to make changes in the pt meds to help with her symptoms. He would like the pt to decrease her lisinopril to once daily. He would also like the pt to decrease carvedilol to 6.25 mg twice daily. Also the risperidone she is taking can also cause dizziness. If her symptoms do not improve with these changes she may need to discuss the risperidone with her PCP. Unable to reach pt or leave a message

## 2012-11-14 NOTE — Telephone Encounter (Signed)
Request a letter for limited work, c/o of lightheadedness with standing for long periods of time and has lightheadedness with bending over and getting up, pt expresses fear with losing job and wants limitations in writing, I will ask Dr Graciela Husbands.

## 2012-11-14 NOTE — Telephone Encounter (Signed)
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

## 2012-11-16 NOTE — Telephone Encounter (Signed)
Spoke with patient and have advised her of Dr Odessa Fleming recommendations regarding lowering her medications.  She is aware and will do so.  I have also let her know if she continues to have the dizziness after reduction in medication to contact her PCP in regards to the Risperidone.  She verbalized understanding but is still asking for a note for work.  Is Dr Graciela Husbands going to write this for her.  Will ask Dr. Graciela Husbands today and call patient back.  If he writes please mail to home address

## 2012-11-16 NOTE — Telephone Encounter (Signed)
Follow-up:    Patient called in returning Nancy Drake's call.  Please call back.

## 2012-11-21 NOTE — Telephone Encounter (Signed)
Please see below. The patient needs a note for work.

## 2012-11-22 NOTE — Telephone Encounter (Signed)
Follow Up   Pt calling to follow up on letter. Would like to speak to nurse.

## 2012-11-22 NOTE — Telephone Encounter (Signed)
Forwarding to Dr. Graciela Husbands- the patient needs a note for work stating what limitations she has due to her condition. 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 Can you please dictate this for her so we can notify the patient. Thanks.

## 2012-11-27 NOTE — Telephone Encounter (Signed)
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

## 2012-11-29 NOTE — Telephone Encounter (Signed)
I left a message for the patient with Dr. Odessa Fleming recommendations and that a letter has not been done.

## 2013-04-19 ENCOUNTER — Emergency Department (HOSPITAL_BASED_OUTPATIENT_CLINIC_OR_DEPARTMENT_OTHER)
Admission: EM | Admit: 2013-04-19 | Discharge: 2013-04-19 | Disposition: A | Discharge status: Home / Self Care | Payer: BC Managed Care – PPO | Attending: Emergency Medicine | Admitting: Emergency Medicine

## 2013-04-19 ENCOUNTER — Encounter (HOSPITAL_BASED_OUTPATIENT_CLINIC_OR_DEPARTMENT_OTHER): Payer: Self-pay

## 2013-04-19 DIAGNOSIS — Z8679 Personal history of other diseases of the circulatory system: Secondary | ICD-10-CM | POA: Insufficient documentation

## 2013-04-19 DIAGNOSIS — N390 Urinary tract infection, site not specified: Secondary | ICD-10-CM | POA: Insufficient documentation

## 2013-04-19 DIAGNOSIS — Z79899 Other long term (current) drug therapy: Secondary | ICD-10-CM | POA: Insufficient documentation

## 2013-04-19 DIAGNOSIS — R109 Unspecified abdominal pain: Secondary | ICD-10-CM | POA: Insufficient documentation

## 2013-04-19 DIAGNOSIS — F411 Generalized anxiety disorder: Secondary | ICD-10-CM | POA: Insufficient documentation

## 2013-04-19 DIAGNOSIS — Z9889 Other specified postprocedural states: Secondary | ICD-10-CM | POA: Insufficient documentation

## 2013-04-19 DIAGNOSIS — Z3202 Encounter for pregnancy test, result negative: Secondary | ICD-10-CM | POA: Insufficient documentation

## 2013-04-19 DIAGNOSIS — Z950 Presence of cardiac pacemaker: Secondary | ICD-10-CM | POA: Insufficient documentation

## 2013-04-19 DIAGNOSIS — R319 Hematuria, unspecified: Secondary | ICD-10-CM

## 2013-04-19 LAB — URINALYSIS, ROUTINE W REFLEX MICROSCOPIC
Nitrite: POSITIVE — AB
Specific Gravity, Urine: 1.022 (ref 1.005–1.030)
Urobilinogen, UA: 1 mg/dL (ref 0.0–1.0)

## 2013-04-19 LAB — URINE MICROSCOPIC-ADD ON

## 2013-04-19 LAB — PREGNANCY, URINE: Preg Test, Ur: NEGATIVE

## 2013-04-19 MED ORDER — CEPHALEXIN 500 MG PO CAPS: 500.0000 mg | ORAL_CAPSULE | Freq: Four times a day (QID) | ORAL | Status: DC

## 2013-04-19 MED ORDER — PHENAZOPYRIDINE HCL 200 MG PO TABS: 200.0000 mg | ORAL_TABLET | Freq: Three times a day (TID) | ORAL | Status: DC

## 2013-04-19 NOTE — ED Notes (Signed)
C/o dysuria, hematuria approx 6pm

## 2013-04-19 NOTE — ED Provider Notes (Signed)
CSN: [id number]     Arrival date & time 04/19/13  1955 History     First MD Initiated Contact with Patient 04/19/13 2114     Chief Complaint  Patient presents with  . Dysuria   (Consider location/radiation/quality/duration/timing/severity/associated sxs/prior Treatment) Patient is a 52 y.o. female presenting with dysuria. The history is provided by the patient. No language interpreter was used.  Dysuria Pain quality:  Aching and burning Pain severity:  Moderate Onset quality:  Gradual Duration:  3 hours Timing:  Constant Progression:  Worsening Chronicity:  New Recent urinary tract infections: yes   Relieved by:  Nothing Worsened by:  Nothing tried Urinary symptoms: hematuria   Associated symptoms: no abdominal pain, no nausea and no vomiting   Risk factors: no hx of pyelonephritis and no hx of urolithiasis    Pt complains of blood in urine,  Pt reports discomfort with urination Past Medical History  Diagnosis Date  . Anxiety   . Cardiomyopathy nonischemic   . Syncope     while walking on treadmill  . LBBB (left bundle branch block)   . Biventricular cardiac pacemaker -Medtronic     DOI 2013   Past Surgical History  Procedure Laterality Date  . Cesarean section    . 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 Pacemaker insertion     Family History  Problem Relation Age of Onset  . Other Other     uncle with pacemaker  . Heart disease Other     PACERMAKER   History  Substance Use Topics  . Smoking status: Never Smoker   . Smokeless tobacco: Never Used  . Alcohol Use: 0.0 oz/week   OB History   Grav Para Term Preterm Abortions TAB SAB Ect Mult Living                 Review of Systems  Gastrointestinal: Negative for nausea, vomiting and abdominal pain.  Genitourinary: Positive for dysuria and hematuria.  All other systems reviewed and are negative.    Allergies  Ambien and Desloratadine  Home Medications   Current Outpatient Rx  Name  Route  Sig  Dispense  Refill  .  acetaminophen (TYLENOL) 500 MG tablet   Oral   Take 500 mg by mouth daily.         Marland Kitchen ALPRAZolam (XANAX) 1 MG tablet   Oral   Take 0.5-1 mg by mouth at bedtime as needed. For anxiety         . carvedilol (COREG) 12.5 MG tablet   Oral   Take 1 tablet (12.5 mg total) by mouth 2 (two) times daily with a meal.   60 tablet   5   . Cyanocobalamin (VITAMIN B-12 PO)   Oral   Take 1 tablet by mouth daily.         . fish oil-omega-3 fatty acids 1000 MG capsule   Oral   Take 2 g by mouth daily.         Marland Kitchen lisinopril (PRINIVIL,ZESTRIL) 5 MG tablet   Oral   Take 1 tablet (5 mg total) by mouth 2 (two) times daily.   60 tablet   5   . Multiple Vitamin (MULTIVITAMIN WITH MINERALS) TABS   Oral   Take 1 tablet by mouth daily.         . risperiDONE (RISPERDAL) 0.5 MG tablet   Oral   Take 1 mg by mouth daily.           BP 156/87  Pulse 77  Temp(Src) 98.7 F (37.1 C) (Oral)  Resp 16  Ht 5\' 1"  (1.549 m)  Wt 120 lb (54.432 kg)  BMI 22.69 kg/m2  SpO2 100% Physical Exam  Nursing note and vitals reviewed. Constitutional: She appears well-developed and well-nourished.  Cardiovascular: Normal rate and normal heart sounds.   Pulmonary/Chest: Effort normal.  Abdominal: Soft. There is no tenderness.  Musculoskeletal: Normal range of motion.  Neurological: She is alert.  Skin: Skin is warm.  Psychiatric: She has a normal mood and affect.    ED Course   Procedures (including critical care time)  Labs Reviewed  URINALYSIS, ROUTINE W REFLEX MICROSCOPIC - Abnormal; Notable for the following:    Color, Urine RED (*)    APPearance TURBID (*)    Hgb urine dipstick LARGE (*)    Bilirubin Urine LARGE (*)    Ketones, ur 40 (*)    Protein, ur >300 (*)    Nitrite POSITIVE (*)    Leukocytes, UA LARGE (*)    All other components within normal limits  URINE MICROSCOPIC-ADD ON - Abnormal; Notable for the following:    Squamous Epithelial / LPF FEW (*)    Bacteria, UA FEW (*)     All other components within normal limits  URINE CULTURE  PREGNANCY, URINE   No results found. No diagnosis found.  MDM   Results for orders placed during the hospital encounter of 04/19/13  URINALYSIS, ROUTINE W REFLEX MICROSCOPIC      Result Value Range   Color, Urine RED (*) YELLOW   APPearance TURBID (*) CLEAR   Specific Gravity, Urine 1.022  1.005 - 1.030   pH 5.5  5.0 - 8.0   Glucose, UA NEGATIVE  NEGATIVE mg/dL   Hgb urine dipstick LARGE (*) NEGATIVE   Bilirubin Urine LARGE (*) NEGATIVE   Ketones, ur 40 (*) NEGATIVE mg/dL   Protein, ur >161 (*) NEGATIVE mg/dL   Urobilinogen, UA 1.0  0.0 - 1.0 mg/dL   Nitrite POSITIVE (*) NEGATIVE   Leukocytes, UA LARGE (*) NEGATIVE  PREGNANCY, URINE      Result Value Range   Preg Test, Ur NEGATIVE  NEGATIVE  URINE MICROSCOPIC-ADD ON      Result Value Range   Squamous Epithelial / LPF FEW (*) RARE   WBC, UA 11-20  <3 WBC/hpf   RBC / HPF TOO NUMEROUS TO COUNT  <3 RBC/hpf   Bacteria, UA FEW (*) RARE   No results found. Pyridium   Keflex 500 qid  See primary for recheck  Elson Areas, PA-C 04/19/13 2132  Lonia Skinner [place], [place] 04/19/13 2138

## 2013-04-20 NOTE — ED Provider Notes (Signed)
History/physical exam/procedure(s) were performed by non-physician practitioner and as supervising physician I was immediately available for consultation/collaboration. I have reviewed all notes and am in agreement with care and plan.   Lene Mckay S Nai Borromeo, MD 04/20/13 1443 

## 2013-04-21 LAB — URINE CULTURE

## 2013-04-22 ENCOUNTER — Telehealth (HOSPITAL_COMMUNITY): Payer: Self-pay | Admitting: Emergency Medicine

## 2013-04-22 NOTE — ED Notes (Signed)
Post ED Visit - Positive Culture Follow-up ° °Culture report reviewed by antimicrobial stewardship pharmacist: °[] Wes Dulaney, Pharm.D., BCPS °[] Jeremy Frens, Pharm.D., BCPS °[x] Elizabeth Martin, Pharm.D., BCPS °[] Minh Pham, Pharm.D., BCPS, AAHIVP °[] Michelle Turner, Pharm.D., BCPS, AAHIVP ° °Positive urine culture °Treated with Keflex, organism sensitive to the same and no further patient follow-up is required at this time. ° °Nancy Drake °04/22/2013, 5:11 PM ° ° °

## 2013-04-24 ENCOUNTER — Telehealth: Payer: Self-pay | Admitting: Internal Medicine

## 2013-04-24 NOTE — Telephone Encounter (Signed)
Spoke with pt and advised that the Keflex needs to be taken 4 times a day. Pt states that she keeps falling asleep during the day and has not been taking more than 2 times daily.

## 2013-04-24 NOTE — Telephone Encounter (Signed)
Patient is calling with questiong about how she needs to be taking her antibiotics. She was seen in the hospital last week.

## 2013-04-25 ENCOUNTER — Ambulatory Visit (INDEPENDENT_AMBULATORY_CARE_PROVIDER_SITE_OTHER): Payer: BC Managed Care – PPO | Admitting: *Deleted

## 2013-04-25 ENCOUNTER — Encounter: Payer: BC Managed Care – PPO | Admitting: Internal Medicine

## 2013-04-25 ENCOUNTER — Ambulatory Visit: Payer: BC Managed Care – PPO | Admitting: *Deleted

## 2013-04-25 VITALS — BP 130/89 | HR 75

## 2013-04-25 DIAGNOSIS — I442 Atrioventricular block, complete: Secondary | ICD-10-CM

## 2013-04-25 DIAGNOSIS — I5022 Chronic systolic (congestive) heart failure: Secondary | ICD-10-CM

## 2013-04-25 DIAGNOSIS — I428 Other cardiomyopathies: Secondary | ICD-10-CM

## 2013-04-25 DIAGNOSIS — Z950 Presence of cardiac pacemaker: Secondary | ICD-10-CM

## 2013-04-25 LAB — PACEMAKER DEVICE OBSERVATION
AL AMPLITUDE: 3.4 mv
AL IMPEDENCE PM: 418 Ohm
BATTERY VOLTAGE: 3.0185 V
RV LEAD AMPLITUDE: 20 mv

## 2013-04-25 NOTE — Patient Instructions (Addendum)
Your physician recommends that you schedule a follow-up appointment in: 3 months with Dr. Graciela Husbands - please call office at [phone] to set up appointment

## 2013-04-25 NOTE — Progress Notes (Signed)
CRT-P check in clinic. Pt missed appointment with SK for am time. Device check and orthostatics performed. Normal device function. Battery longevity 6.5 years. 1 nst episode lasting 6 beats and 9 SVT episodes. Optivol increase from 04-19-13 and ongoing. ROV in 3 mths w/SK.

## 2013-05-04 ENCOUNTER — Ambulatory Visit (INDEPENDENT_AMBULATORY_CARE_PROVIDER_SITE_OTHER): Payer: BC Managed Care – PPO | Admitting: Internal Medicine

## 2013-05-04 ENCOUNTER — Encounter: Payer: Self-pay | Admitting: Internal Medicine

## 2013-05-04 VITALS — BP 124/84 | HR 64 | Temp 99.0°F | Wt 121.2 lb

## 2013-05-04 DIAGNOSIS — N390 Urinary tract infection, site not specified: Secondary | ICD-10-CM

## 2013-05-04 MED ORDER — CIPROFLOXACIN HCL 500 MG PO TABS: 500.0000 mg | ORAL_TABLET | Freq: Two times a day (BID) | ORAL | Status: DC

## 2013-05-04 NOTE — Progress Notes (Signed)
  Subjective:    Patient ID: Nancy Drake, female    DOB: September 16, 1960, 52 y.o.   MRN: [id number]  HPI ER followup. Chart reviewed. Went to the ER 04-19-13 complaining of dysuria, urinalyses showed blood, WBCs and protein. She was diagnosed with a UTI and prescribed Keflex. She has been taking Keflex inconsistently, sometimes 2 times a day only. She finished Pyridium.  Past Medical History: 10-2010 syncope--->ECHO 10-2010, cath 10-2010 10-2010 dx with Dilated cardiomyopathy with initial  EF ~ 30% by echo 03-2012, complete heart block, status post pacemaker Anxiety, lost a child , sees psych Schizophrenia not on BCP thyroid dz? getting checked at Dr Talmage Nap  (3-11)  Past Surgical History: c-section Pacemaker insertion, 03-2012 Family History: CAD--no DM-- +  Social History: born in [place],   divorced , lives w/ father, 2 child, lost one   tobacco-- no ETOH-- socially   Review of Systems At this point she is feeling better, no dysuria or gross hematuria. No fever, chills. No nausea, vomiting. Denies any abdominal pain or flank pain.     Objective:   Physical Exam General -- alert, well-developed, NAD.   Abdomen-- Not distended, good bowel sounds,soft, non-tender. No CVA tenderness  Extremities-- no pretibial edema bilaterally  Neurologic-- alert & oriented X3. Speech, gait normal.  Psych-- Cognition and judgment appear intact. Alert and cooperative with normal attention span and concentration. not anxious appearing and not depressed appearing.       Assessment & Plan:  UTI, Urinalysis is consistent with UTI, urine culture showed pansensitive Escherichia coli. Has taken Keflex in consistently. Plan: Cipro for 5 days Recheck a urinalysis- ucx  in 2 weeks

## 2013-05-04 NOTE — Patient Instructions (Addendum)
Stop keflex, start Cipro Schedule labs in 2 weeks: UCX, UA-- dx UTI You are due for a physical exam, please schedule at your earliest convenience

## 2013-05-18 ENCOUNTER — Other Ambulatory Visit (INDEPENDENT_AMBULATORY_CARE_PROVIDER_SITE_OTHER): Payer: BC Managed Care – PPO

## 2013-05-18 DIAGNOSIS — N390 Urinary tract infection, site not specified: Secondary | ICD-10-CM

## 2013-05-18 LAB — POCT URINALYSIS DIPSTICK
Bilirubin, UA: NEGATIVE
Blood, UA: NEGATIVE
Glucose, UA: NEGATIVE
Nitrite, UA: NEGATIVE
Spec Grav, UA: 1.02

## 2013-05-20 LAB — URINE CULTURE: Colony Count: >=100000

## 2013-05-22 ENCOUNTER — Other Ambulatory Visit: Payer: Self-pay | Admitting: Internal Medicine

## 2013-05-22 DIAGNOSIS — N390 Urinary tract infection, site not specified: Secondary | ICD-10-CM

## 2013-05-22 MED ORDER — LEVOFLOXACIN 500 MG PO TABS: 500.0000 mg | ORAL_TABLET | Freq: Every day | ORAL | Status: DC

## 2013-05-23 ENCOUNTER — Telehealth: Payer: Self-pay | Admitting: Internal Medicine

## 2013-05-23 DIAGNOSIS — D229 Melanocytic nevi, unspecified: Secondary | ICD-10-CM

## 2013-05-23 NOTE — Telephone Encounter (Signed)
Done. Pt states picked up the antibiotic Monday 05/22/13 & arranged for urine culture 06/08/13. Pt also would like a referral put in for dermatology.  DJR

## 2013-05-23 NOTE — Telephone Encounter (Signed)
Enter a Dermatology referral-- dx moles

## 2013-05-23 NOTE — Telephone Encounter (Signed)
Referral ordered. DJR  

## 2013-05-23 NOTE — Telephone Encounter (Signed)
Patient called to see could we send a referral to MD Louisa Second to remove her moles. thanks

## 2013-05-23 NOTE — Addendum Note (Signed)
Addended by: Eustace Quail on: 05/23/2013 02:25 PM   Modules accepted: Orders

## 2013-05-23 NOTE — Telephone Encounter (Signed)
Message copied by Eustace Quail on Tue May 23, 2013  1:26 PM ------      Message from: Willow Ora E      Created: Sun May 21, 2013 12:08 PM       Please call patient,  Still has a urinary tract infection.      Plan:      Levaquin 500 mg one by mouth daily #7, no refills.      Arrange a urine culture in 3 weeks ------

## 2013-05-31 ENCOUNTER — Encounter: Payer: Self-pay | Admitting: Internal Medicine

## 2013-06-02 ENCOUNTER — Encounter: Payer: BC Managed Care – PPO | Admitting: Internal Medicine

## 2013-06-08 ENCOUNTER — Other Ambulatory Visit (INDEPENDENT_AMBULATORY_CARE_PROVIDER_SITE_OTHER): Payer: BC Managed Care – PPO

## 2013-06-08 DIAGNOSIS — N390 Urinary tract infection, site not specified: Secondary | ICD-10-CM

## 2013-06-08 LAB — POCT URINALYSIS DIPSTICK
Blood, UA: NEGATIVE
Glucose, UA: NEGATIVE
Nitrite, UA: NEGATIVE
Protein, UA: NEGATIVE
Urobilinogen, UA: 0.2

## 2013-06-12 ENCOUNTER — Encounter: Payer: Self-pay | Admitting: *Deleted

## 2013-06-12 ENCOUNTER — Other Ambulatory Visit: Payer: BC Managed Care – PPO

## 2013-06-12 NOTE — Progress Notes (Signed)
Letter mailed to patient.

## 2013-06-21 NOTE — ED Provider Notes (Signed)
Please note that there is a mistake in the patient's past history. It states history of schizophrenia. That is incorrect. She does not have a history of schizophrenia.  Dione Booze, MD 06/21/13 2300

## 2013-06-27 ENCOUNTER — Telehealth: Payer: Self-pay

## 2013-06-27 NOTE — Telephone Encounter (Addendum)
Left message for call back---unable to reach prior to visit identifiable  Flu Vaccine? CCS--due MMG--11/2010--neg Pap--?

## 2013-06-28 ENCOUNTER — Other Ambulatory Visit: Payer: Self-pay

## 2013-06-29 ENCOUNTER — Encounter: Payer: Self-pay | Admitting: Internal Medicine

## 2013-06-29 ENCOUNTER — Ambulatory Visit (INDEPENDENT_AMBULATORY_CARE_PROVIDER_SITE_OTHER): Payer: BC Managed Care – PPO | Admitting: Internal Medicine

## 2013-06-29 VITALS — BP 123/78 | HR 68 | Temp 97.5°F | Wt 128.0 lb

## 2013-06-29 DIAGNOSIS — D229 Melanocytic nevi, unspecified: Secondary | ICD-10-CM

## 2013-06-29 DIAGNOSIS — I428 Other cardiomyopathies: Secondary | ICD-10-CM

## 2013-06-29 DIAGNOSIS — Z23 Encounter for immunization: Secondary | ICD-10-CM

## 2013-06-29 DIAGNOSIS — Z0000 Encounter for general adult medical examination without abnormal findings: Secondary | ICD-10-CM

## 2013-06-29 DIAGNOSIS — F411 Generalized anxiety disorder: Secondary | ICD-10-CM

## 2013-06-29 DIAGNOSIS — R7989 Other specified abnormal findings of blood chemistry: Secondary | ICD-10-CM

## 2013-06-29 LAB — COMPREHENSIVE METABOLIC PANEL
AST: 39 U/L — ABNORMAL HIGH (ref 0–37)
Albumin: 3.9 g/dL (ref 3.5–5.2)
Alkaline Phosphatase: 72 U/L (ref 39–117)
BUN: 15 mg/dL (ref 6–23)
Creatinine, Ser: 0.7 mg/dL (ref 0.4–1.2)
Potassium: 3.9 mEq/L (ref 3.5–5.1)
Total Bilirubin: 0.5 mg/dL (ref 0.3–1.2)

## 2013-06-29 LAB — LIPID PANEL
Cholesterol: 195 mg/dL (ref 0–200)
HDL: 59.2 mg/dL
LDL Cholesterol: 123 mg/dL — ABNORMAL HIGH (ref 0–99)
Total CHOL/HDL Ratio: 3
Triglycerides: 62 mg/dL (ref 0.0–149.0)
VLDL: 12.4 mg/dL (ref 0.0–40.0)

## 2013-06-29 LAB — CBC WITH DIFFERENTIAL/PLATELET
Basophils Absolute: 0 10*3/uL (ref 0.0–0.1)
Eosinophils Absolute: 0.2 10*3/uL (ref 0.0–0.7)
Eosinophils Relative: 2.7 % (ref 0.0–5.0)
HCT: 36.3 % (ref 36.0–46.0)
Hemoglobin: 12.4 g/dL (ref 12.0–15.0)
Lymphocytes Relative: 28 % (ref 12.0–46.0)
Lymphs Abs: 1.8 10*3/uL (ref 0.7–4.0)
MCHC: 34.1 g/dL (ref 30.0–36.0)
Monocytes Absolute: 0.5 10*3/uL (ref 0.1–1.0)
Monocytes Relative: 7.6 % (ref 3.0–12.0)
Neutro Abs: 3.8 10*3/uL (ref 1.4–7.7)
Neutrophils Relative %: 61.2 % (ref 43.0–77.0)
Platelets: 261 10*3/uL (ref 150.0–400.0)
RDW: 13.4 % (ref 11.5–14.6)
WBC: 6.3 10*3/uL (ref 4.5–10.5)

## 2013-06-29 LAB — TSH: TSH: 3.5 u[IU]/mL (ref 0.35–5.50)

## 2013-06-29 NOTE — Assessment & Plan Note (Addendum)
Td 2009 Flu shot--today PNM shot today  Sees gynecology routinely. Cscope ~ 2008? had a polyp? Dr Jarold Motto?; later was referred to [facility] for a surgical removal (apparently a hemorrhoid) asked pt to bring records (addendum: my assistant did call [facility], they said no records of cscope )  Labs including A1c Diet-exercise discussed

## 2013-06-29 NOTE — Assessment & Plan Note (Signed)
To see cardiology soon, I notedt she is not taking aspirin

## 2013-06-29 NOTE — Assessment & Plan Note (Signed)
Needs a referral to Dr Louisa Second, will do

## 2013-06-29 NOTE — Progress Notes (Signed)
  Subjective:    Patient ID: Nancy Drake, female    DOB: [birth year], 52 y.o.   MRN: [id number]  HPI CPX  Past Medical History  Diagnosis Date  . Anxiety     lost a child , sees psych  . Cardiomyopathy nonischemic     dx after a syncope 10-2010  . Complete heart block 03-2012  . Biventricular cardiac pacemaker -Medtronic     DOI 2013  . Thyroid disease     used to see Dr Lisabeth Devoid  . Mole of skin     bx 05-2013   Past Surgical History  Procedure Laterality Date  . Cesarean section    . Pacemaker insertion  03-2012   History   Social History  . Marital Status: Divorced    Spouse Name: N/A    Number of Children: 2  . 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 not working at present    Social History Main Topics  . Smoking status: Never Smoker   . Smokeless tobacco: Never Used  . Alcohol Use: 0.0 oz/week     Comment: socially   . Drug Use: No  . Sexual Activity: Not on file   Other Topics Concern  . Not on file   Social History Narrative   Born in [place], lives w/ father    2 child, lost one    Family History  Problem Relation Age of Onset  . Other Other     uncle with pacemaker  . Heart disease Other     PACERMAKER  . Colon cancer Neg Hx   . Breast cancer Other     cousin  . Diabetes Mother     Review of Systems Diet-- healthier but no wt loss  Exercise-- goes to the gym regularly  No  CP, SOB, lower extremity edema Denies  nausea, vomiting diarrhea Denies  blood in the stools (-) cough, sputum production, (-) wheezing, chest congestion Had a uti, now asx. No dysuria, gross hematuria, difficulty urinating       Objective:   Physical Exam  BP 123/78  Pulse 68  Temp(Src) 97.5 F (36.4 C)  Wt 128 lb (58.06 kg)  BMI 24.2 kg/m2  SpO2 100% General -- alert, well-developed, NAD.  Neck --no thyromegaly , normal carotid pulse Lungs -- normal respiratory effort, no intercostal retractions, no accessory muscle use, and normal breath sounds.   Heart-- normal rate, regular rhythm, no murmur.  Abdomen-- Not distended, good bowel sounds,soft, non-tender.  Extremities-- no pretibial edema bilaterally  Neurologic--  alert & oriented X3. Speech normal, gait normal, strength normal in all extremities.  Psych-- Cognition and judgment appear intact. Cooperative with normal attention span and concentration. No anxious appearing , no depressed appearing.      Assessment & Plan:

## 2013-06-29 NOTE — Patient Instructions (Signed)
Get your blood work before you leave  Next visit in 1 year for a physical exam  . Fasting Please make an appointment     

## 2013-06-29 NOTE — Assessment & Plan Note (Signed)
Per Dr Donell Beers

## 2013-06-30 ENCOUNTER — Telehealth: Payer: Self-pay | Admitting: *Deleted

## 2013-06-30 NOTE — Telephone Encounter (Deleted)
Spoke to Dr Maris Berger nurse states that theres no hx of her having one done at their office. DJR

## 2013-06-30 NOTE — Telephone Encounter (Deleted)
Message copied by Eustace Quail on Fri Jun 30, 2013  5:21 PM ------      Message from: Willow Ora E      Created: Thu Jun 29, 2013  1:38 PM       Onalee Hua      Please contact Dr Jarold Motto nurse (GI)      Is pt due for a colonoscopy? ------

## 2013-06-30 NOTE — Telephone Encounter (Signed)
Error

## 2013-07-03 ENCOUNTER — Ambulatory Visit (INDEPENDENT_AMBULATORY_CARE_PROVIDER_SITE_OTHER): Payer: BC Managed Care – PPO | Admitting: Internal Medicine

## 2013-07-03 DIAGNOSIS — I428 Other cardiomyopathies: Secondary | ICD-10-CM

## 2013-07-03 DIAGNOSIS — Z950 Presence of cardiac pacemaker: Secondary | ICD-10-CM

## 2013-07-03 DIAGNOSIS — I5022 Chronic systolic (congestive) heart failure: Secondary | ICD-10-CM

## 2013-07-03 DIAGNOSIS — I442 Atrioventricular block, complete: Secondary | ICD-10-CM

## 2013-07-03 LAB — PACEMAKER DEVICE OBSERVATION
AL AMPLITUDE: 3.25 mv
AL IMPEDENCE PM: 456 Ohm
ATRIAL PACING PM: 0.07
BAMS-0001: 170 {beats}/min
BATTERY VOLTAGE: 3.0178 V
LV LEAD THRESHOLD: 0.75 V
RV LEAD AMPLITUDE: 31.625 mv
RV LEAD IMPEDENCE PM: 475 Ohm
RV LEAD THRESHOLD: 1.25 V
VENTRICULAR PACING PM: 99.94

## 2013-07-03 NOTE — Assessment & Plan Note (Signed)
The patient's device was interrogated.  The information was reviewed. No changes were made in the programming.    

## 2013-07-03 NOTE — Patient Instructions (Signed)
Your physician recommends that you continue on your current medications as directed. Please refer to the Current Medication list given to you today.  Remote monitoring is used to monitor your Pacemaker of ICD from home. This monitoring reduces the number of office visits required to check your device to one time per year. It allows [place] to keep an eye on the functioning of your device to ensure it is working properly. You are scheduled for a device check from home on 10/04/2013. You may send your transmission at any time that day. If you have a wireless device, the transmission will be sent automatically. After your physician reviews your transmission, you will receive a postcard with your next transmission date.   Your physician wants you to follow-up in: one year with Dr. Graciela Husbands.  You will receive a reminder letter in the mail two months in advance. If you don't receive a letter, please call our office to schedule the follow-up appointment.

## 2013-07-03 NOTE — Progress Notes (Signed)
      Patient Care Team: Wanda Plump, MD as PCP - General Archer Asa, MD (Psychiatry)   HPI  Nancy Drake is a 52 y.o. female Seen in followup for nonischemic cardiomyopathy syncope and bundle branch block. She is status post CRT P. implantation. She is on ACE inhibitors and beta blockers. Left ventricular function with 40-45% echo February 2014   She has no major complaints of chest pain or shortness of breath. There is some modest positional dizziness. She is currently not working but is taking care of her father   Past Medical History  Diagnosis Date  . Anxiety     lost a child , sees psych  . Cardiomyopathy nonischemic     dx after a syncope 10-2010  . Complete heart block 03-2012  . Biventricular cardiac pacemaker -Medtronic     DOI 2013  . Thyroid disease     used to see Dr Lisabeth Devoid  . Mole of skin     bx 05-2013    Past Surgical History  Procedure Laterality Date  . Cesarean section    . Pacemaker insertion  03-2012    Current Outpatient Prescriptions  Medication Sig Dispense Refill  . acetaminophen (TYLENOL) 500 MG tablet Take 500 mg by mouth every 6 (six) hours as needed.       . ALPRAZolam (XANAX) 1 MG tablet Take 0.5-1 mg by mouth at bedtime as needed. For anxiety      . carvedilol (COREG) 12.5 MG tablet Take 1 tablet (12.5 mg total) by mouth 2 (two) times daily with a meal.  60 tablet  5  . Cyanocobalamin (VITAMIN B-12 PO) Take 1 tablet by mouth daily.      . fish oil-omega-3 fatty acids 1000 MG capsule Take 2 g by mouth daily.      Marland Kitchen lisinopril (PRINIVIL,ZESTRIL) 5 MG tablet Take 5 mg by mouth daily.      . Multiple Vitamin (MULTIVITAMIN WITH MINERALS) TABS Take 1 tablet by mouth daily.      . risperiDONE (RISPERDAL) 0.5 MG tablet Take 1 mg by mouth daily.        No current facility-administered medications for this visit.    Allergies  Allergen Reactions  . Ambien [Zolpidem Tartrate] Other (See Comments)    Abnormal dreams  . Desloratadine       Insomnia     Review of Systems negative except from HPI and PMH  Physical Exam There were no vitals taken for this visit. Well developed and well nourished in no acute distress HENT normal E scleral and icterus clear Neck Supple JVP flat; carotids brisk and full Clear to ausculation ,Device pocket well healed; without hematoma or erythema.  There is no tethering Regular rate and rhythm, no murmurs gallops or rub Soft with active bowel sounds No clubbing cyanosis no  Edema Alert and oriented, grossly normal motor and sensory function Skin Warm and Dry  ECG demonstrates P. synchronous pacing with a negative QRS deflection in V1-V3 and a positive flexion in the inferior leads. Chest x-ray demonstrated appropriate positioning  Assessment and  Plan

## 2013-07-03 NOTE — Assessment & Plan Note (Signed)
She is euvolemic with her mild LV dysfunction

## 2013-07-03 NOTE — Assessment & Plan Note (Signed)
Some intrinsic conduction

## 2013-07-03 NOTE — Assessment & Plan Note (Signed)
Continue beta blockers and ACE inhibitors. 

## 2013-07-04 ENCOUNTER — Encounter: Payer: Self-pay | Admitting: Internal Medicine

## 2013-07-05 ENCOUNTER — Encounter: Payer: Self-pay | Admitting: *Deleted

## 2013-07-05 NOTE — Addendum Note (Signed)
Addended by: Eustace Quail on: 07/05/2013 03:57 PM   Modules accepted: Orders

## 2013-07-11 ENCOUNTER — Encounter: Payer: Self-pay | Admitting: Internal Medicine

## 2013-07-13 ENCOUNTER — Ambulatory Visit (INDEPENDENT_AMBULATORY_CARE_PROVIDER_SITE_OTHER): Payer: BC Managed Care – PPO | Admitting: Nurse Practitioner

## 2013-07-13 ENCOUNTER — Encounter: Payer: Self-pay | Admitting: Nurse Practitioner

## 2013-07-13 VITALS — BP 142/82 | HR 89 | Temp 98.1°F | Wt 129.8 lb

## 2013-07-13 DIAGNOSIS — J111 Influenza due to unidentified influenza virus with other respiratory manifestations: Secondary | ICD-10-CM

## 2013-07-13 MED ORDER — OSELTAMIVIR PHOSPHATE 75 MG PO CAPS: 75.0000 mg | ORAL_CAPSULE | Freq: Two times a day (BID) | ORAL | Status: DC

## 2013-07-13 NOTE — Patient Instructions (Signed)
I think you have flu, given the rapid onset of symptoms. I will treat with tamiflu. Flu typically lasts 5-10 days. You need to rest, sip fluids every hour, take tylenol for fever & aches. Please let us know if you are running fever over 100.4 or develop chest pain with deep inspiration.  Feel better!  Influenza, Adult Influenza ("the flu") is a viral infection of the respiratory tract. It occurs more often in winter months because people spend more time in close contact with one another. Influenza can make you feel very sick. Influenza easily spreads from person to person (contagious). CAUSES  Influenza is caused by a virus that infects the respiratory tract. You can catch the virus by breathing in droplets from an infected person's cough or sneeze. You can also catch the virus by touching something that was recently contaminated with the virus and then touching your mouth, nose, or eyes. SYMPTOMS  Symptoms typically last 4 to 10 days and may include:  Fever.  Chills.  Headache, body aches, and muscle aches.  Sore throat.  Chest discomfort and cough.  Poor appetite.  Weakness or feeling tired.  Dizziness.  Nausea or vomiting. DIAGNOSIS  Diagnosis of influenza is often made based on your history and a physical exam. A nose or throat swab test can be done to confirm the diagnosis. RISKS AND COMPLICATIONS You may be at risk for a more severe case of influenza if you smoke cigarettes, have diabetes, have chronic heart disease (such as heart failure) or lung disease (such as asthma), or if you have a weakened immune system. Elderly people and pregnant women are also at risk for more serious infections. The most common complication of influenza is a lung infection (pneumonia). Sometimes, this complication can require emergency medical care and may be life-threatening. PREVENTION  An annual influenza vaccination (flu shot) is the best way to avoid getting influenza. An annual flu shot is now  routinely recommended for all adults in the [place] TREATMENT  In mild cases, influenza goes away on its own. Treatment is directed at relieving symptoms. For more severe cases, your caregiver may prescribe antiviral medicines to shorten the sickness. Antibiotic medicines are not effective, because the infection is caused by a virus, not by bacteria. HOME CARE INSTRUCTIONS  Only take over-the-counter or prescription medicines for pain, discomfort, or fever as directed by your caregiver.  Use a cool mist humidifier to make breathing easier.  Get plenty of rest until your temperature returns to normal. This usually takes 3 to 4 days.  Drink enough fluids to keep your urine clear or pale yellow.  Cover your mouth and nose when coughing or sneezing, and wash your hands well to avoid spreading the virus.  Stay home from work or school until your fever has been gone for at least 1 full day. SEEK MEDICAL CARE IF:   You have chest pain or a deep cough that worsens or produces more mucus.  You have nausea, vomiting, or diarrhea. SEEK IMMEDIATE MEDICAL CARE IF:   You have difficulty breathing, shortness of breath, or your skin or nails turn bluish.  You have severe neck pain or stiffness.  You have a severe headache, facial pain, or earache.  You have a worsening or recurring fever.  You have nausea or vomiting that cannot be controlled. MAKE SURE YOU:  Understand these instructions.  Will watch your condition.  Will get help right away if you are not doing well or get worse. Document Released: 08/14/2000  Document Revised: 02/16/2012 Document Reviewed: 11/16/2011 [facility] Patient Information 2014 [place], [place].

## 2013-07-13 NOTE — Progress Notes (Signed)
  Subjective:    Patient ID: Nancy Drake, female    DOB: [birth year], 52 y.o.   MRN: [id number]  Fever  This is a new problem. The current episode started yesterday. The problem occurs intermittently. The problem has been waxing and waning. The maximum temperature noted was 103 to 103.9 F (pt states she took temp after getting out of tub last night 104.0, not sure-may have been 100.4). The temperature was taken using an oral thermometer. Associated symptoms include coughing and a sore throat. Pertinent negatives include no abdominal pain, chest pain, congestion, ear pain, headaches, muscle aches, nausea, rash, vomiting or wheezing. Associated symptoms comments: Rapid onset cough & fever-within a few hours of feeling well.. She has tried acetaminophen for the symptoms. The treatment provided significant relief.      Review of Systems  Constitutional: Positive for fever and fatigue. Negative for activity change and appetite change.       Took tylenol this am.  HENT: Positive for sore throat. Negative for congestion and ear pain.   Eyes: Negative for redness.  Respiratory: Positive for cough. Negative for chest tightness and wheezing.   Cardiovascular: Negative for chest pain.  Gastrointestinal: Negative for nausea, vomiting and abdominal pain.  Musculoskeletal: Positive for arthralgias.  Skin: Negative for color change and rash.  Neurological: Negative for headaches.  Hematological: Negative for adenopathy.       Objective:   Physical Exam  Vitals reviewed. Constitutional: She is oriented to person, place, and time. She appears well-developed and well-nourished. No distress.  HENT:  Head: Normocephalic and atraumatic.  Right Ear: External ear normal.  Left Ear: External ear normal.  Mouth/Throat: Posterior oropharyngeal erythema (mild) present.  Eyes: Conjunctivae are normal. Right eye exhibits no discharge. Left eye exhibits no discharge.  Neck: Normal range of motion. Neck  supple. No thyromegaly present.  Cardiovascular: Normal rate, regular rhythm and normal heart sounds.   No murmur heard. Pulmonary/Chest: Effort normal and breath sounds normal. No respiratory distress.  Lymphadenopathy:    She has no cervical adenopathy.  Neurological: She is alert and oriented to person, place, and time.  Skin: Skin is warm and dry.  Psychiatric: She has a normal mood and affect. Her behavior is normal. Thought content normal.          Assessment & Plan:  1. Flu Rapid onset fever & cough. - oseltamivir (TAMIFLU) 75 MG capsule; Take 1 capsule (75 mg total) by mouth 2 (two) times daily.  Dispense: 10 capsule; Refill: 0 See pt instructions for f/u.

## 2013-07-13 NOTE — Progress Notes (Signed)
Pre-visit discussion using our clinic review tool. No additional management support is needed unless otherwise documented below in the visit note.  

## 2013-07-21 NOTE — Progress Notes (Signed)
Diagnosis of schizophrenia entered in error by previous physician and removed at patient's written amendment request and with the approval of Dr.Paz as documented by his signature. 

## 2013-07-21 NOTE — Progress Notes (Addendum)
Diagnosis of schizophrenia entered in error by previous physician and removed at patient's written amendment request and with the approval of Dr.Tabori as documented by her signature.

## 2013-07-21 NOTE — Progress Notes (Signed)
Diagnosis of schizophrenia entered in error by previous physician and removed at patient's written amendment request and with the approval of Dr.Lowne as documented by her signature.

## 2013-07-21 NOTE — Progress Notes (Signed)
Diagnosis of schizophrenia entered in error by previous physician and removed at patient's written amendment request and with the approval of Dr.Paz as documented by his signature.

## 2013-07-26 ENCOUNTER — Other Ambulatory Visit (INDEPENDENT_AMBULATORY_CARE_PROVIDER_SITE_OTHER): Payer: BC Managed Care – PPO

## 2013-07-26 DIAGNOSIS — R7989 Other specified abnormal findings of blood chemistry: Secondary | ICD-10-CM

## 2013-07-26 LAB — AST: AST: 18 U/L (ref 0–37)

## 2013-07-26 LAB — ALT: ALT: 21 U/L (ref 0–35)

## 2013-07-28 ENCOUNTER — Encounter: Payer: Self-pay | Admitting: *Deleted

## 2013-08-13 ENCOUNTER — Encounter (HOSPITAL_BASED_OUTPATIENT_CLINIC_OR_DEPARTMENT_OTHER): Payer: Self-pay | Admitting: Emergency Medicine

## 2013-08-13 ENCOUNTER — Emergency Department (HOSPITAL_BASED_OUTPATIENT_CLINIC_OR_DEPARTMENT_OTHER)
Admission: EM | Admit: 2013-08-13 | Discharge: 2013-08-13 | Disposition: A | Discharge status: Home / Self Care | Payer: Medicaid Other | Attending: Emergency Medicine | Admitting: Emergency Medicine

## 2013-08-13 DIAGNOSIS — Z8679 Personal history of other diseases of the circulatory system: Secondary | ICD-10-CM | POA: Insufficient documentation

## 2013-08-13 DIAGNOSIS — Z79899 Other long term (current) drug therapy: Secondary | ICD-10-CM | POA: Insufficient documentation

## 2013-08-13 DIAGNOSIS — Z862 Personal history of diseases of the blood and blood-forming organs and certain disorders involving the immune mechanism: Secondary | ICD-10-CM | POA: Insufficient documentation

## 2013-08-13 DIAGNOSIS — Z8639 Personal history of other endocrine, nutritional and metabolic disease: Secondary | ICD-10-CM | POA: Insufficient documentation

## 2013-08-13 DIAGNOSIS — F411 Generalized anxiety disorder: Secondary | ICD-10-CM | POA: Insufficient documentation

## 2013-08-13 DIAGNOSIS — J029 Acute pharyngitis, unspecified: Secondary | ICD-10-CM | POA: Insufficient documentation

## 2013-08-13 DIAGNOSIS — Z950 Presence of cardiac pacemaker: Secondary | ICD-10-CM | POA: Insufficient documentation

## 2013-08-13 DIAGNOSIS — Z872 Personal history of diseases of the skin and subcutaneous tissue: Secondary | ICD-10-CM | POA: Insufficient documentation

## 2013-08-13 NOTE — ED Provider Notes (Signed)
CSN: [id number]     Arrival date & time 08/13/13  1534 History  This chart was scribed for Charles B. Bernette Mayers, MD by Dorothey Baseman, ED Scribe. This patient was seen in room MH11/MH11 and the patient's care was started at 3:56 PM.    Chief Complaint  Patient presents with  . Sore Throat   The history is provided by the patient. No language interpreter was used.   HPI Comments: Nancy Drake is a 52 y.o. female who presents to the Emergency Department complaining of a constant sore throat for the past few days that is exacerbated with cough and talking. She reports noticing some "white stuff" in the back of her throat, but states that it is not significantly painful. She reports an associated, mild cough with a small amount of sputum. Patient reports taking OTC medications at home without relief. She states that she was diagnosed with the flu last month. She denies fever.   Past Medical History  Diagnosis Date  . Anxiety     lost a child , sees psych  . Cardiomyopathy nonischemic     dx after a syncope 10-2010  . Complete heart block 03-2012  . Biventricular cardiac pacemaker -Medtronic     DOI 2013  . Thyroid disease     used to see Dr Lisabeth Devoid  . Mole of skin     bx 05-2013   Past Surgical History  Procedure Laterality Date  . Cesarean section    . Pacemaker insertion  03-2012   Family History  Problem Relation Age of Onset  . Other Other     uncle with pacemaker  . Heart disease Other     PACERMAKER  . Colon cancer Neg Hx   . Breast cancer Other     cousin  . Diabetes Mother    History  Substance Use Topics  . Smoking status: Never Smoker   . Smokeless tobacco: Never Used  . Alcohol Use: 0.0 oz/week     Comment: socially    OB History   Grav Para Term Preterm Abortions TAB SAB Ect Mult Living                 Review of Systems  A complete 10 system review of systems was obtained and all systems are negative except as noted in the HPI and PMH.   Allergies   Ambien and Desloratadine  Home Medications   Current Outpatient Rx  Name  Route  Sig  Dispense  Refill  . acetaminophen (TYLENOL) 500 MG tablet   Oral   Take 500 mg by mouth every 6 (six) hours as needed.          . ALPRAZolam (XANAX) 1 MG tablet   Oral   Take 0.5-1 mg by mouth at bedtime as needed. For anxiety         . carvedilol (COREG) 12.5 MG tablet      Take 1/2 Tablet By Mouth 2 times Daily         . Cyanocobalamin (VITAMIN B-12 PO)   Oral   Take 1 tablet by mouth daily.         . fish oil-omega-3 fatty acids 1000 MG capsule   Oral   Take 2 g by mouth daily.         Marland Kitchen lisinopril (PRINIVIL,ZESTRIL) 5 MG tablet   Oral   Take 5 mg by mouth daily.         . Multiple Vitamin (MULTIVITAMIN WITH MINERALS)  TABS   Oral   Take 1 tablet by mouth daily.         Marland Kitchen oseltamivir (TAMIFLU) 75 MG capsule   Oral   Take 1 capsule (75 mg total) by mouth 2 (two) times daily.   10 capsule   0   . risperiDONE (RISPERDAL) 1 MG tablet   Oral   Take 1 mg by mouth at bedtime.          Triage Vitals: BP 184/76  Pulse 88  Temp(Src) 99.1 F (37.3 C) (Oral)  Resp 16  Ht 5\' 1"  (1.549 m)  Wt 120 lb (54.432 kg)  BMI 22.69 kg/m2  SpO2 100%  Physical Exam  Nursing note and vitals reviewed. Constitutional: She is oriented to person, place, and time. She appears well-developed and well-nourished. No distress.  HENT:  Head: Normocephalic and atraumatic.  Tonsillar exudate vs tonsillith on the left.   Eyes: Conjunctivae are normal.  Neck: Normal range of motion. Neck supple.  Cardiovascular: Normal rate, regular rhythm and normal heart sounds.  Exam reveals no gallop and no friction rub.   No murmur heard. Pulmonary/Chest: Effort normal. No respiratory distress.  Abdominal: She exhibits no distension.  Musculoskeletal: Normal range of motion.  Lymphadenopathy:    She has no cervical adenopathy.  Neurological: She is alert and oriented to person, place, and time.   Skin: Skin is warm and dry.  Psychiatric: She has a normal mood and affect. Her behavior is normal.    ED Course  Procedures (including critical care time)  DIAGNOSTIC STUDIES: Oxygen Saturation is 100% on room air, normal by my interpretation.    COORDINATION OF CARE: 4:00 PM- Ordered a rapid strep test. Discussed treatment plan with patient at bedside and patient verbalized agreement.     Labs Review Labs Reviewed  RAPID STREP SCREEN  CULTURE, GROUP A STREP   Imaging Review No results found.  EKG Interpretation   None       MDM   1. Pharyngitis     Strep neg, advised symptomatic care. PCP followup.   I personally performed the services described in this documentation, which was scribed in my presence. The recorded information has been reviewed and is accurate.      Charles B. Bernette Mayers, MD 08/13/13 [phone]

## 2013-08-13 NOTE — ED Notes (Signed)
Pt seen by PMD last month ago DX FLU , today d/o sore throat

## 2013-08-13 NOTE — ED Notes (Signed)
No new rx given-  

## 2013-08-15 LAB — CULTURE, GROUP A STREP

## 2013-08-21 ENCOUNTER — Other Ambulatory Visit: Payer: Self-pay | Admitting: Cardiology

## 2013-09-21 ENCOUNTER — Telehealth: Payer: Self-pay | Admitting: Internal Medicine

## 2013-09-21 NOTE — Telephone Encounter (Signed)
New message  Nancy Drake needs you to call regarding changes that need to be made in patients medical records.

## 2013-10-03 ENCOUNTER — Telehealth: Payer: Self-pay | Admitting: Internal Medicine

## 2013-10-03 NOTE — Telephone Encounter (Signed)
New message         Pt would like to know how to set up the transmitter

## 2013-10-03 NOTE — Telephone Encounter (Signed)
LMOM with Medtronic support #

## 2013-10-04 ENCOUNTER — Encounter: Payer: BC Managed Care – PPO | Admitting: *Deleted

## 2013-10-18 ENCOUNTER — Encounter: Payer: Self-pay | Admitting: *Deleted

## 2013-11-06 NOTE — H&P (Signed)
History and Physical   Patient ID: Nancy Drake MRN: [id number], DOB/AGE: 10-17-1960 53 y.o. Date of Encounter: 11/06/2013  Primary Physician: Kathlene November, MD Primary Cardiologist: DM  Chief Complaint:  Syncope  HPI: Nancy Drake is a 53 year old female with a history of cardiomyopathy. Today she was in her usual state of health when she had a syncopal episode. After the syncopal episode she felt nauseated and needed to have a bowel movement. She also felt weak. Her daughter helped her to the bathroom. In the bathroom she vomited once and had a bowel movement. After that she felt some better but was still very weak. EMS had been called and when they did an ECG, it showed complete heart block with a ventricular rate in the 20s. She had normal P waves. They began and externally pacing her and transported her to [facility] emergency room.  In the emergency room, the transcutaneous pacer would not despite adjusting the settings. She was diaphoretic and very pale. She lost consciousness and required a brief episode of CPR. She was given atropine, totaling 2 mg. After the second atropine she responded and is now in sinus rhythm with a heart rate of 97. She feels much better and is no longer diaphoretic. At no time did she have chest pain or palpitations. She is currently on IV Dopamine 5 mcg/kg/min.  Past Medical History  Diagnosis Date  . Anxiety     lost a child , sees psych  . Cardiomyopathy nonischemic     dx after a syncope 10-2010  . Complete heart block 03-2012  . Biventricular cardiac pacemaker -Medtronic     DOI 2013  . Thyroid disease     used to see Dr Debbora Presto  . Mole of skin     bx 05-2013     Surgical History:  Past Surgical History  Procedure Laterality Date  . Cesarean section    . Pacemaker insertion  03-2012     I have reviewed the patient's current medications. Medication Sig  ALPRAZolam (XANAX) 1 MG tablet Take 0.5-1 mg by mouth 4 (four) times daily as needed. For  anxiety  carvedilol (COREG) 12.5 MG tablet Take 12.5 mg by mouth 2 (two) times daily with a meal.  lisinopril (PRINIVIL,ZESTRIL) 5 MG tablet Take 5 mg by mouth 2 (two) times daily.  Multiple Vitamin (MULTIVITAMIN WITH MINERALS) TABS Take 1 tablet by mouth daily.  risperiDONE (RISPERDAL) 0.5 MG tablet Take 0.5 mg by mouth 2 (two) times daily.     Scheduled Meds: Continuous Infusions: PRN Meds:.    Allergies:  Allergies  Allergen Reactions  . Ambien [Zolpidem Tartrate] Other (See Comments)    Abnormal dreams  . Desloratadine     Insomnia    History   Social History  . Marital Status: Divorced    Spouse Name: N/A    Number of Children: 2  . 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 not working at present    Social History Main Topics  . Smoking status: Never Smoker   . Smokeless tobacco: Never Used  . Alcohol Use: 0.0 oz/week     Comment: socially   . Drug Use: No  . Sexual Activity: Not on file   Other Topics Concern  . Not on file   Social History Narrative   Born in [place], lives w/ father    2 child, lost one     Family History  Problem Relation Age of Onset  .  Other Other     uncle with pacemaker  . Heart disease Other     PACERMAKER  . Colon cancer Neg Hx   . Breast cancer Other     cousin  . Diabetes Mother      Review of Systems: See HPI.  Full 14-point review of systems otherwise negative except as noted above.   Physical Exam: Blood pressure 116/69, pulse 101, temperature 99.7 F (37.6 C), temperature source Oral, resp. rate 18, last menstrual period 02/21/2012, SpO2 96.00%. General: Well developed, well nourished, female in moderate distress. Head: Normocephalic, atraumatic, sclera non-icteric, no xanthomas, nares are without discharge. Dentition: good Neck: no carotid bruits. JVD not elevated. No thyromegally Lungs: Good expansion bilaterally. without wheezes or rhonchi. Clear to auscultation bilaterally Heart: Regular rate and  rhythm with S1 S2.  No S3 or S4.  No murmur, no rubs, or gallops appreciated. Abdomen: Soft, non-tender, non-distended with normoactive bowel sounds. No hepatomegaly. No rebound/guarding. No obvious abdominal masses. Msk:  Strength and tone appear normal for age. No joint deformities or effusions, no spine or costo-vertebral angle tenderness. Extremities: No clubbing or cyanosis. No edema.  Distal pedal pulses are 2+ in 4 extrem Neuro: Alert and oriented X 3. Moves all extremities spontaneously. No focal deficits noted. Psych:  Responds to questions appropriately but appears anxious. Skin: No rashes or lesions noted  Labs:  pending  Radiology/Studies:  pending  Echo: 06/24/2011 Study Conclusions - Left ventricle: There may be some increase in LV filling pressure. Septal dyssynergy. Mild relative hypokinesis of the lateral wall. The cavity size was normal. Wall thickness was increased in a pattern of mild LVH. Systolic function was mildly reduced. The estimated ejection fraction was in the range of 45% to 50%. - Mitral valve: Flat closure of the mitral valve. Mild regurgitation. - Impressions: There has been some improvement in LV function since 08/2010.   ECG: EMS ECG showed CHB with a ventricular rate in the 20's    ASSESSMENT AND PLAN:  Principal Problem:  *CHB (complete heart block) - felt to be the cause of the syncope. She has responded to atropine and Dopamine.  Plan to pace a temporary pacing wire with tx to CCU.  CHF  Patient has received 2 L IV fluid since arrival to ER.  Will follow strict I/Os       Signed,  Dorris Carnes PA-C 11/06/2013, 3:47 PM   Patient seen and examined.  Agree with findings of R Barrett as noted above. Patient currently on IV Dopamine 5 mcg/kg/min  HR 94  Lungs:  CTA   Card:  RRR.  Abdomen:  Normal BS  Nontender.  No S3.  Ext:  No edema.  2+ pulses. With 2nd episode of CHB will plan for temporary wire.  Can continue DA and follow. EP to  see patient re PPM.   WIll need to follow fluid (I/Os)  Closely over the weekend. Discontinue coreg

## 2013-11-16 ENCOUNTER — Encounter: Payer: Self-pay | Admitting: Internal Medicine

## 2013-11-16 ENCOUNTER — Ambulatory Visit (INDEPENDENT_AMBULATORY_CARE_PROVIDER_SITE_OTHER): Payer: Medicaid Other | Admitting: Internal Medicine

## 2013-11-16 VITALS — BP 159/110 | HR 70 | Ht 61.0 in | Wt 138.1 lb

## 2013-11-16 DIAGNOSIS — I428 Other cardiomyopathies: Secondary | ICD-10-CM

## 2013-11-16 DIAGNOSIS — I5022 Chronic systolic (congestive) heart failure: Secondary | ICD-10-CM

## 2013-11-16 DIAGNOSIS — Z950 Presence of cardiac pacemaker: Secondary | ICD-10-CM

## 2013-11-16 LAB — MDC_IDC_ENUM_SESS_TYPE_INCLINIC
Battery Remaining Longevity: 71 mo
Battery Voltage: 3.01 V
Date Time Interrogation Session: 20150319073105
Lead Channel Impedance Value: 380 Ohm
Lead Channel Impedance Value: 437 Ohm
Lead Channel Impedance Value: 475 Ohm
Lead Channel Impedance Value: 589 Ohm
Lead Channel Pacing Threshold Amplitude: 0.875 V
Lead Channel Pacing Threshold Amplitude: 0.875 V
Lead Channel Pacing Threshold Pulse Width: 0.4 ms
Lead Channel Pacing Threshold Pulse Width: 0.4 ms
Lead Channel Sensing Intrinsic Amplitude: 2.5 mV
Lead Channel Sensing Intrinsic Amplitude: 31.625 mV
Lead Channel Sensing Intrinsic Amplitude: 31.625 mV
Lead Channel Setting Pacing Amplitude: 2.5 V
Lead Channel Setting Pacing Pulse Width: 0.4 ms
Lead Channel Setting Pacing Pulse Width: 0.4 ms
Lead Channel Setting Sensing Sensitivity: 0.9 mV
MDC IDC MSMT LEADCHNL LV IMPEDANCE VALUE: 456 Ohm
MDC IDC MSMT LEADCHNL LV IMPEDANCE VALUE: 589 Ohm
MDC IDC MSMT LEADCHNL LV IMPEDANCE VALUE: 722 Ohm
MDC IDC MSMT LEADCHNL LV IMPEDANCE VALUE: 893 Ohm
MDC IDC MSMT LEADCHNL RA IMPEDANCE VALUE: 323 Ohm
MDC IDC MSMT LEADCHNL RA PACING THRESHOLD AMPLITUDE: 0.75 V
MDC IDC MSMT LEADCHNL RA SENSING INTR AMPL: 3 mV
MDC IDC MSMT LEADCHNL RV PACING THRESHOLD PULSEWIDTH: 0.4 ms
MDC IDC SET LEADCHNL LV PACING AMPLITUDE: 2 V
MDC IDC SET LEADCHNL RA PACING AMPLITUDE: 2 V
MDC IDC SET ZONE DETECTION INTERVAL: 400 ms
MDC IDC STAT BRADY AP VP PERCENT: 0.09 %
MDC IDC STAT BRADY AP VS PERCENT: 0.01 %
MDC IDC STAT BRADY AS VP PERCENT: 99.86 %
MDC IDC STAT BRADY AS VS PERCENT: 0.05 %
MDC IDC STAT BRADY RA PERCENT PACED: 0.1 %
MDC IDC STAT BRADY RV PERCENT PACED: 99.94 %
Zone Setting Detection Interval: 350 ms

## 2013-11-16 MED ORDER — CARVEDILOL 12.5 MG PO TABS: 12.5000 mg | ORAL_TABLET | Freq: Two times a day (BID) | ORAL | Status: DC

## 2013-11-16 NOTE — Patient Instructions (Signed)
Your physician has recommended you make the following change in your medication:  1) Increase Carvedilol to 12.5 mg twice daily  Follow up with your PCP in 4-6 weeks for blood pressure check  Remote monitoring is used to monitor your Pacemaker of ICD from home. This monitoring reduces the number of office visits required to check your device to one time per year. It allows [place] to keep an eye on the functioning of your device to ensure it is working properly. You are scheduled for a device check from home on 02/19/14. You may send your transmission at any time that day. If you have a wireless device, the transmission will be sent automatically. After your physician reviews your transmission, you will receive a postcard with your next transmission date.  Your physician wants you to follow-up in: 1 year with Dr. Caryl Comes.  You will receive a reminder letter in the mail two months in advance. If you don't receive a letter, please call our office to schedule the follow-up appointment.
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
## 2013-11-29 ENCOUNTER — Encounter: Payer: Medicaid Other | Admitting: Internal Medicine

## 2013-11-30 ENCOUNTER — Ambulatory Visit: Payer: Medicaid Other | Admitting: Internal Medicine

## 2013-12-04 ENCOUNTER — Ambulatory Visit: Payer: Medicaid Other | Admitting: Internal Medicine

## 2013-12-04 ENCOUNTER — Telehealth: Payer: Self-pay | Admitting: Internal Medicine

## 2013-12-04 DIAGNOSIS — Z0289 Encounter for other administrative examinations: Secondary | ICD-10-CM

## 2013-12-04 NOTE — Telephone Encounter (Signed)
Spoke with patient after she became very upset and verbally abusive with staff. Patient was here for appt but during check-in it was discovered that she now has [place] [facility]. I advised the patient that we did not accept [place] and that we could not see her today. I advised the patient that she needed to see the provider listed on her card. Patient then became very loud with me,stating "this is ridiculous, the system sucks and this office doesn't know what they are doing." I asked the patient if she told the registration person when she made the appt if her insurance had changed, she replied " I told them I had Medicaid." I advised the patient that we do take regular medicaid but that we do not take [place] [facility]. Patient then became more upset and verbal with me. She stated "I just want all my records and be done with all you." Patient then got up, went over to open the door. She slammed it against the wall as well as the door to the lobby. Patient then upon exiting the building, she turned over 2 chairs in the lobby as another patient that was in the lobby watched. Based upon her behavior today, I recommend she be dismissed from the practice.

## 2013-12-04 NOTE — Telephone Encounter (Signed)
Unfortunately, we won't be able to see her due to her insurance. Also, I agree with my nurse assessment, please start the discharge process. Please facilitate any way we can her transition to another office

## 2013-12-04 NOTE — Telephone Encounter (Signed)
12/04/13  Pt had apt on 12/04/13, but was unable to be seen due to [place] being insurance.  Pt was very upset.  Pt would like to know of another physician that could see her and accept the [place] [facility].  Please call pt.

## 2013-12-05 ENCOUNTER — Encounter: Payer: Self-pay | Admitting: *Deleted

## 2013-12-05 NOTE — Telephone Encounter (Signed)
Letter printed and given to [employer]

## 2013-12-07 ENCOUNTER — Telehealth: Payer: Self-pay | Admitting: Internal Medicine

## 2013-12-07 NOTE — Telephone Encounter (Signed)
Dismissal Letter sent Certified Mail on 07/86/7544  Received the Return Receipt showing someone picked up the [place] letter 12/11/2013

## 2013-12-07 NOTE — Telephone Encounter (Signed)
Sent to HIM for initiation of dismissal process.

## 2013-12-19 IMAGING — CR DG CHEST 2V
2 series · 2 of 2 positions shown · non-contrast
Comparison: Chest x-ray 04/12/2012.

CLINICAL DATA: Status post pacemaker placement.

CHEST - 2 VIEW

[w chest pa]
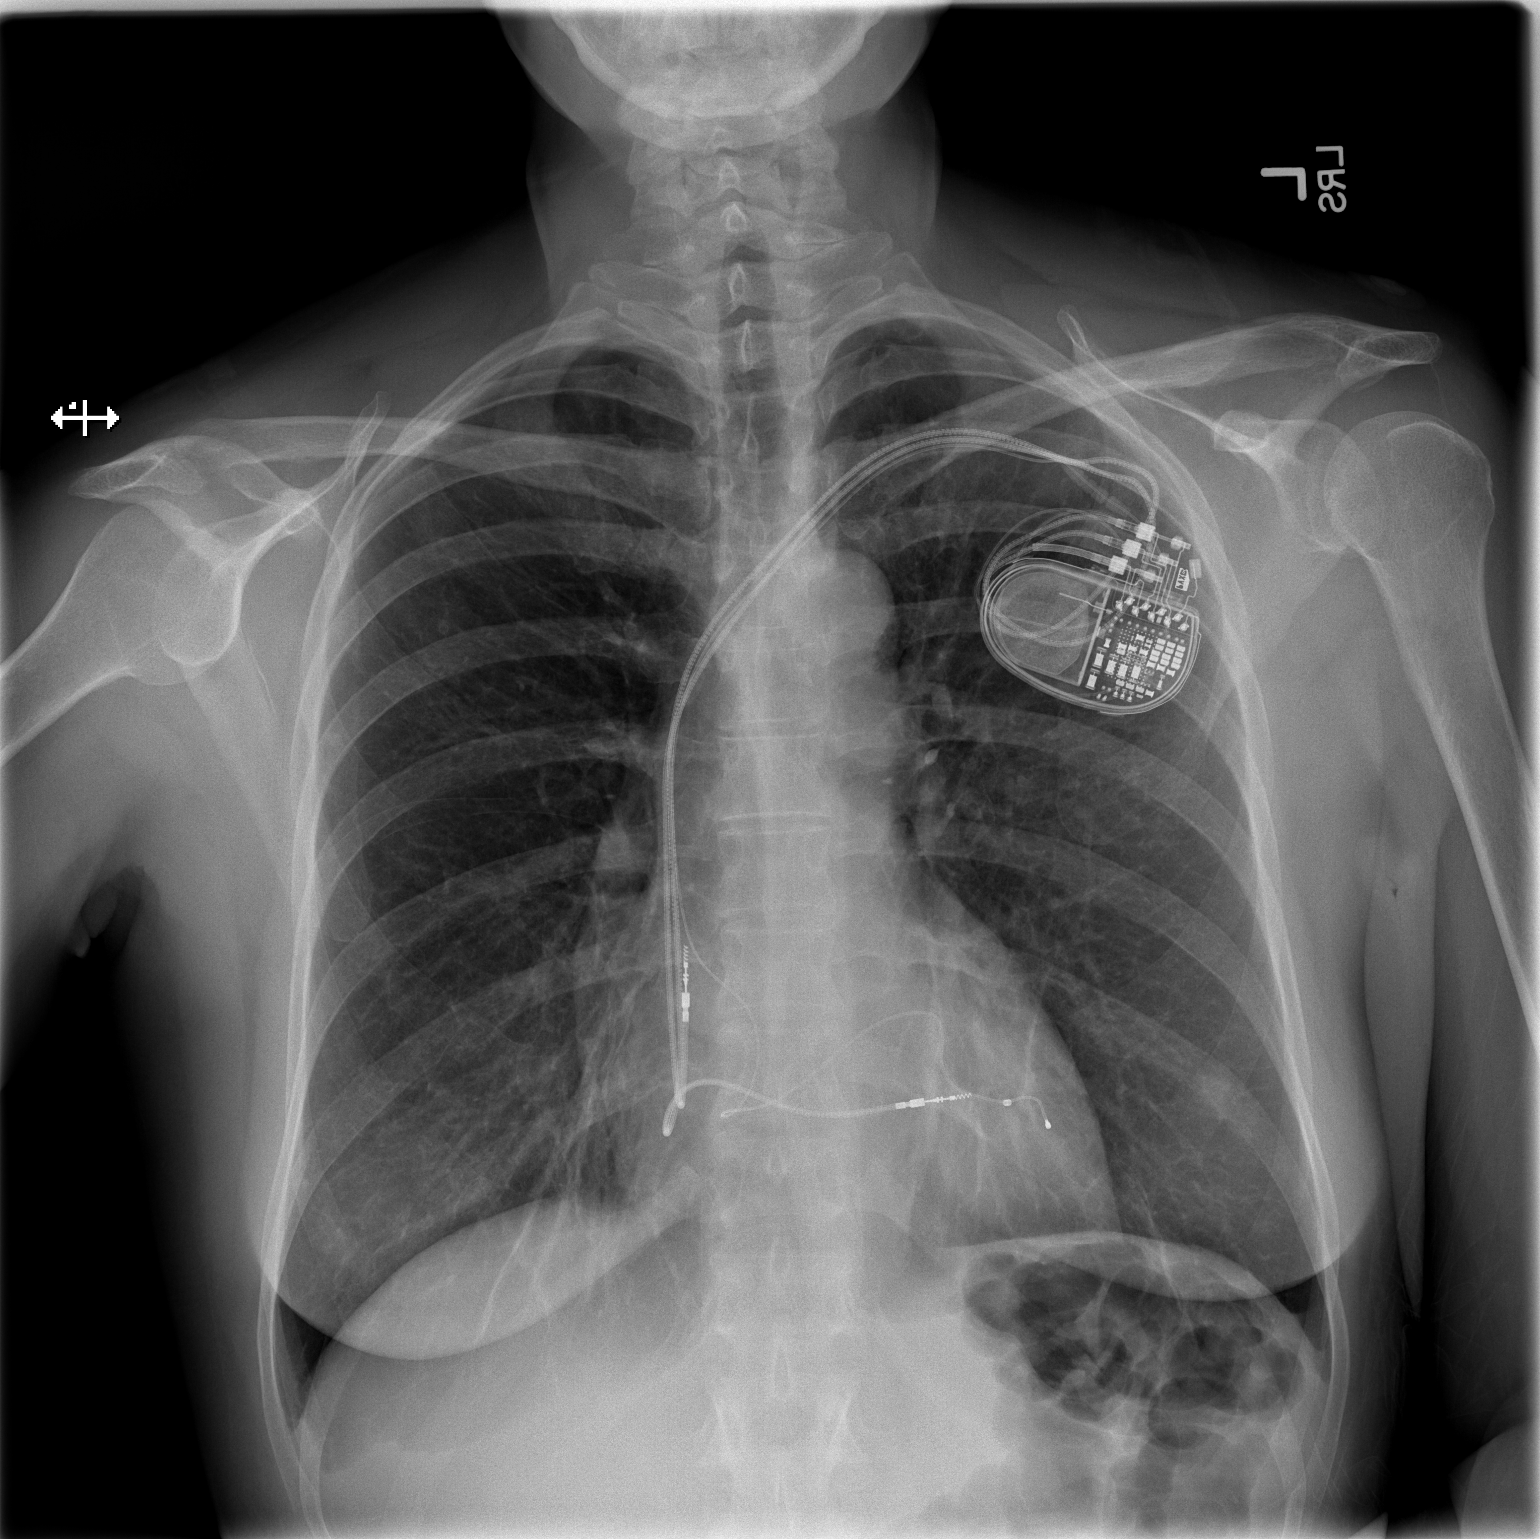

[w chest lat]
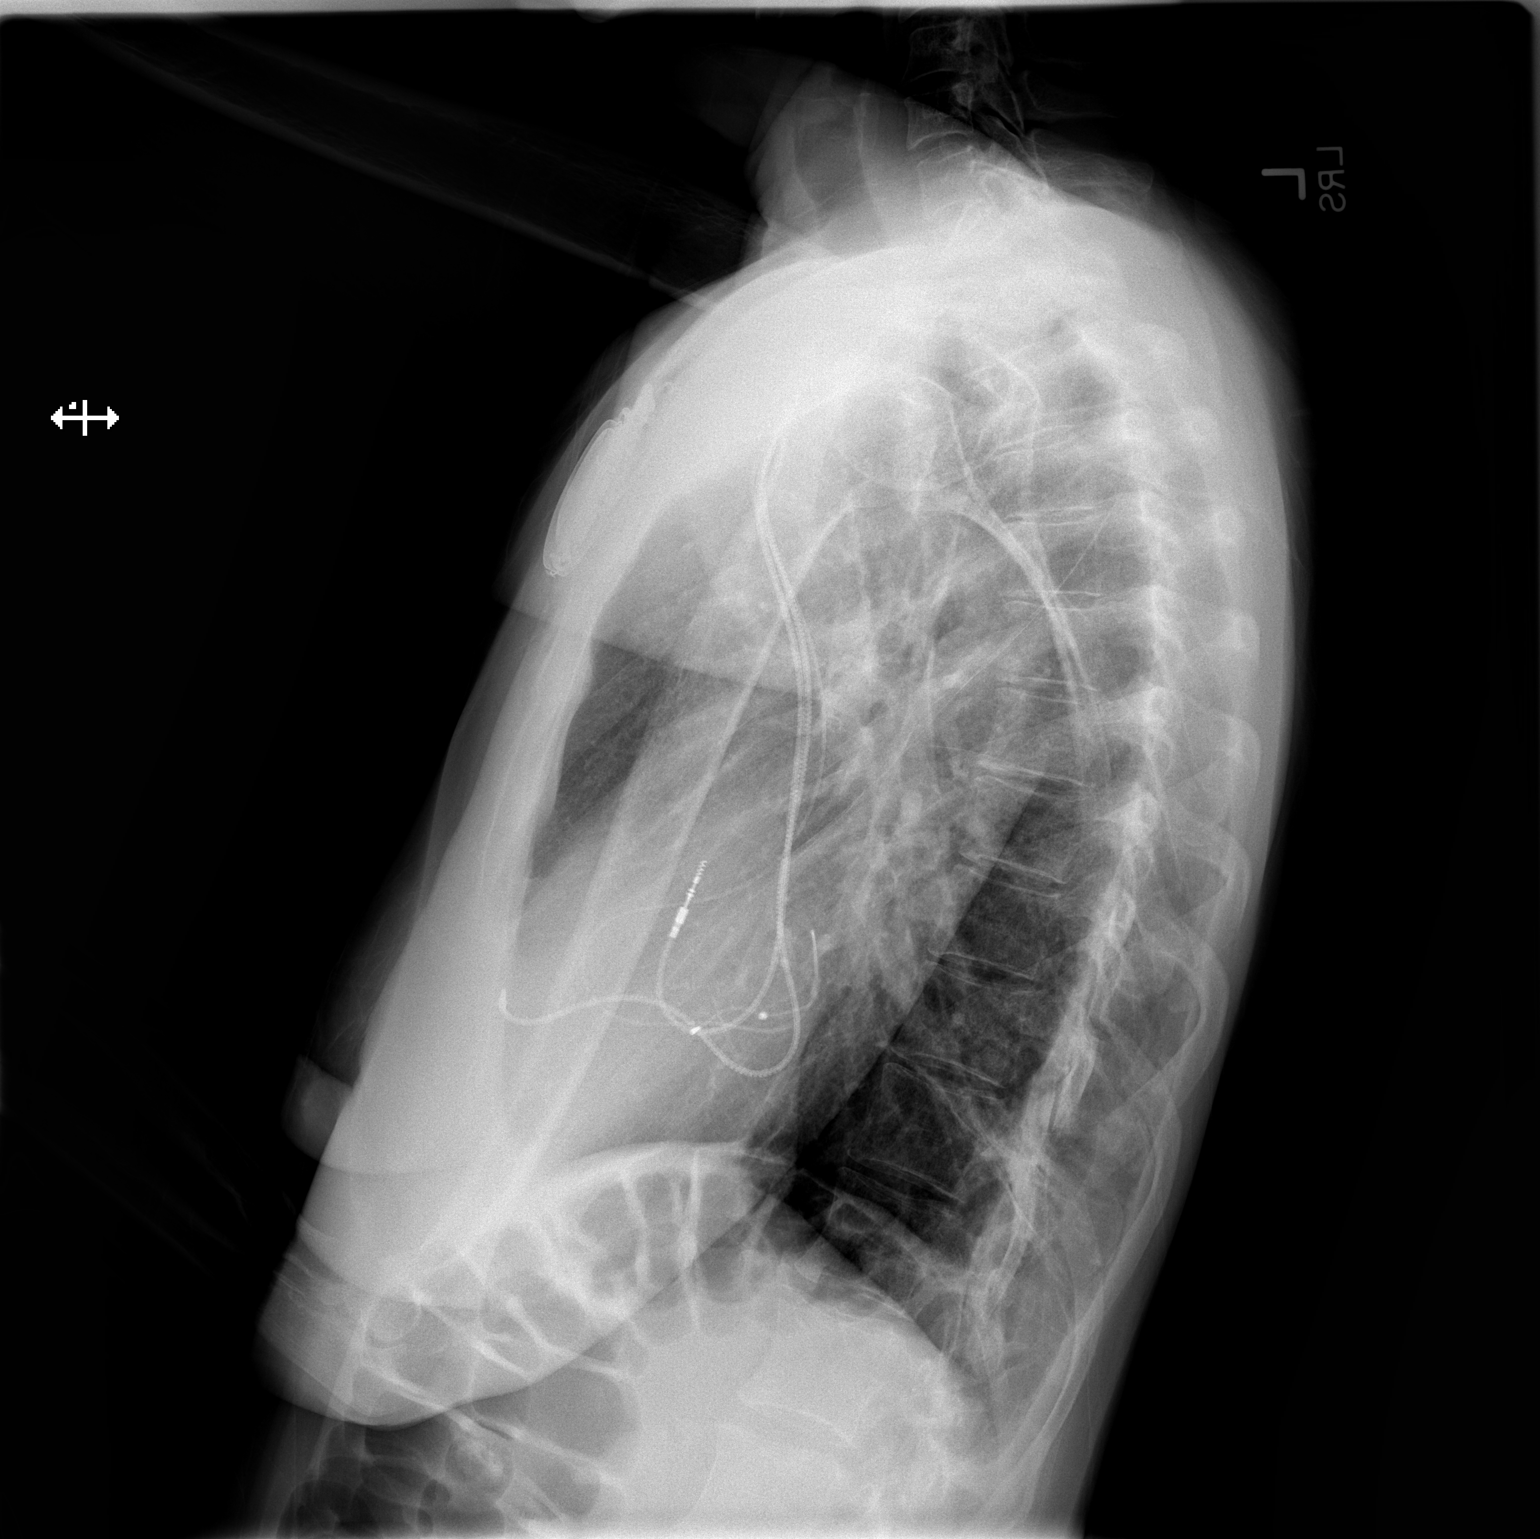

[2 of 2 positions shown; findings below may reference images not displayed]

FINDINGS: Lungs are well expanded bilaterally, without acute
consolidative airspace disease or definite pneumothorax.  Trace
bilateral pleural effusions.  New nodular opacity projecting over
the left base laterally where the anterior aspect of the left sixth
and posterior aspect of the ninth ribs overlap; given the fact that
this is not visualized at all on yesterday's examination, this is
strongly favored to represent a prominent nipple shadow
superimposed upon the adjacent bony structures. Heart size and
mediastinal contours are within normal limits.  The left-sided
biventricular pacemaker is in place with lead tips projecting over
the expected location of the right atrium, right ventricular apex
and lateral wall of the left ventricle (via the coronary sinus and
coronary veins). Notably, the right ventricular lead is in a higher
position than seen on the prior radiograph.
IMPRESSION: 1. Status post revision placement of a right ventricular lead in
this patient with a biventricular pacemaker device.  No
pneumothorax.
2.  Trace bilateral pleural effusions.
3.  New nodular opacity projecting over the lower aspect of the
left lung, favored to represent the confluence of the left nipple
shadow and adjacent bony structures.  Attention on follow-up
studies is recommended.

## 2014-02-07 ENCOUNTER — Ambulatory Visit: Payer: Self-pay | Admitting: Internal Medicine

## 2014-02-12 ENCOUNTER — Ambulatory Visit: Payer: Medicaid Other | Attending: Internal Medicine | Admitting: Internal Medicine

## 2014-02-12 ENCOUNTER — Encounter: Payer: Self-pay | Admitting: Internal Medicine

## 2014-02-12 VITALS — BP 138/91 | HR 67 | Temp 98.3°F | Resp 17 | Wt 144.6 lb

## 2014-02-12 DIAGNOSIS — F341 Dysthymic disorder: Secondary | ICD-10-CM

## 2014-02-12 DIAGNOSIS — F3289 Other specified depressive episodes: Secondary | ICD-10-CM | POA: Insufficient documentation

## 2014-02-12 DIAGNOSIS — R55 Syncope and collapse: Secondary | ICD-10-CM | POA: Diagnosis not present

## 2014-02-12 DIAGNOSIS — Z950 Presence of cardiac pacemaker: Secondary | ICD-10-CM | POA: Insufficient documentation

## 2014-02-12 DIAGNOSIS — Z0000 Encounter for general adult medical examination without abnormal findings: Secondary | ICD-10-CM | POA: Insufficient documentation

## 2014-02-12 DIAGNOSIS — E785 Hyperlipidemia, unspecified: Secondary | ICD-10-CM

## 2014-02-12 DIAGNOSIS — F419 Anxiety disorder, unspecified: Secondary | ICD-10-CM

## 2014-02-12 DIAGNOSIS — I10 Essential (primary) hypertension: Secondary | ICD-10-CM

## 2014-02-12 DIAGNOSIS — Z139 Encounter for screening, unspecified: Secondary | ICD-10-CM

## 2014-02-12 DIAGNOSIS — F411 Generalized anxiety disorder: Secondary | ICD-10-CM | POA: Insufficient documentation

## 2014-02-12 DIAGNOSIS — M79609 Pain in unspecified limb: Secondary | ICD-10-CM | POA: Diagnosis not present

## 2014-02-12 DIAGNOSIS — I428 Other cardiomyopathies: Secondary | ICD-10-CM

## 2014-02-12 DIAGNOSIS — M79671 Pain in right foot: Secondary | ICD-10-CM | POA: Insufficient documentation

## 2014-02-12 DIAGNOSIS — F32A Depression, unspecified: Secondary | ICD-10-CM | POA: Insufficient documentation

## 2014-02-12 DIAGNOSIS — F329 Major depressive disorder, single episode, unspecified: Secondary | ICD-10-CM | POA: Insufficient documentation

## 2014-02-12 NOTE — Progress Notes (Signed)
Patient Demographics  Nancy Drake, is a 53 y.o. female  AJO:[id number]  NOB:[id number]  DOB - 1961-06-09  CC:  Chief Complaint  Patient presents with  . Establish Care       HPI: Nancy Drake is a 53 y.o. female here today to establish medical care.She has history of anxiety, hypertension, syncope, LBB block, nonischemic  cardiomyopathy, patient has pacemaker in place, she follows up with the cardiologist currently patient is taking Coreg and is also taking lisinopril 5 mg daily, patient also has history of anxiety/depression and following her with her psychiatrist, she reported to have right foot pain on and off for the last 2 months she denies any fall or trauma. Patient has No headache, No chest pain, No abdominal pain - No Nausea, No new weakness tingling or numbness, No Cough - SOB.  Allergies  Allergen Reactions  . Ambien [Zolpidem Tartrate] Other (See Comments)    Abnormal dreams  . Desloratadine     Insomnia    Past Medical History  Diagnosis Date  . Anxiety     lost a child , sees psych  . Cardiomyopathy nonischemic     dx after a syncope 10-2010  . Complete heart block 03-2012  . Biventricular cardiac pacemaker -Medtronic     DOI 2013  . Thyroid disease     used to see Dr Debbora Presto  . Mole of skin     bx 05-2013   Current Outpatient Prescriptions on File Prior to Visit  Medication Sig Dispense Refill  . acetaminophen (TYLENOL) 500 MG tablet Take 500 mg by mouth every 6 (six) hours as needed.       . ALPRAZolam (XANAX) 0.25 MG tablet Take 0.25 mg by mouth at bedtime as needed for anxiety.      Marland Kitchen BIOTIN PO Take 1,000 mg by mouth daily.      . carvedilol (COREG) 12.5 MG tablet Take 1 tablet (12.5 mg total) by mouth 2 (two) times daily with a meal.  60 tablet  6  . Cyanocobalamin (VITAMIN B-12 PO) Take 1 tablet by mouth daily.      . fish oil-omega-3 fatty acids 1000 MG capsule Take 2 g by mouth daily.      Marland Kitchen lisinopril (PRINIVIL,ZESTRIL) 5 MG  tablet TAKE 1 TABLET BY MOUTH  DAILY      . Multiple Vitamin (MULTIVITAMIN WITH MINERALS) TABS Take 1 tablet by mouth daily.      . risperiDONE (RISPERDAL) 1 MG tablet Take 1 mg by mouth at bedtime.       No current facility-administered medications on file prior to visit.   Family History  Problem Relation Age of Onset  . Other Other     uncle with pacemaker  . Heart disease Other     PACERMAKER  . Colon cancer Neg Hx   . Breast cancer Other     cousin  . Diabetes Mother   . Cancer Father     prostate cancer   History   Social History  . Marital Status: Divorced    Spouse Name: N/A    Number of Children: 2  . 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 not working at present    Social History Main Topics  . Smoking status: Never Smoker   . Smokeless tobacco: Never Used  . Alcohol Use: 0.0 oz/week     Comment: socially   . Drug Use: No  . Sexual Activity: Not on file  Other Topics Concern  . Not on file   Social History Narrative   Born in [place], lives w/ father    2 child, lost one     Review of Systems: Constitutional: Negative for fever, chills, diaphoresis, activity change, appetite change and fatigue. HENT: Negative for ear pain, nosebleeds, congestion, facial swelling, rhinorrhea, neck pain, neck stiffness and ear discharge.  Eyes: Negative for pain, discharge, redness, itching and visual disturbance. Respiratory: Negative for cough, choking, chest tightness, shortness of breath, wheezing and stridor.  Cardiovascular: Negative for chest pain, palpitations and leg swelling. Gastrointestinal: Negative for abdominal distention. Genitourinary: Negative for dysuria, urgency, frequency, hematuria, flank pain, decreased urine volume, difficulty urinating and dyspareunia.  Musculoskeletal: Negative for back pain, joint swelling, arthralgia and gait problem. Neurological: Negative for dizziness, tremors, seizures, syncope, facial asymmetry, speech difficulty,  weakness, light-headedness, numbness and headaches.  Hematological: Negative for adenopathy. Does not bruise/bleed easily. Psychiatric/Behavioral: Negative for hallucinations, behavioral problems, confusion, dysphoric mood, decreased concentration and agitation.    Objective:   Filed Vitals:   02/12/14 1540  BP: 138/91  Pulse: 67  Temp: 98.3 F (36.8 C)  Resp: 17    Physical Exam: Constitutional: Patient appears well-developed and well-nourished. No distress. HENT: Normocephalic, atraumatic, External right and left ear normal. Oropharynx is clear and moist.  Eyes: Conjunctivae and EOM are normal. PERRLA, no scleral icterus. Neck: Normal ROM. Neck supple. No JVD. No tracheal deviation. No thyromegaly. CVS: RRR, S1/S2 +, no murmurs, no gallops, no carotid bruit.  Pulmonary: Effort and breath sounds normal, no stridor, rhonchi, wheezes, rales. S/c pacemaker palpable Abdominal: Soft. BS +, no distension, tenderness, rebound or guarding.  Musculoskeletal: Normal range of motion. No edema and no tenderness. Minimal tenderness on right heel. Neuro: Alert. Normal reflexes, muscle tone coordination. No cranial nerve deficit. Skin: Skin is warm and dry. No rash noted. Not diaphoretic. No erythema. No pallor. Psychiatric: Normal mood and affect. Behavior, judgment, thought content normal.  Lab Results  Component Value Date   WBC 6.3 06/29/2013   HGB 12.4 06/29/2013   HCT 36.3 06/29/2013   MCV 87.0 06/29/2013   PLT 261.0 06/29/2013   Lab Results  Component Value Date   CREATININE 0.7 06/29/2013   BUN 15 06/29/2013   NA 141 06/29/2013   K 3.9 06/29/2013   CL 105 06/29/2013   CO2 31 06/29/2013    Lab Results  Component Value Date   HGBA1C 5.9 06/29/2013   Lipid Panel     Component Value Date/Time   CHOL 195 06/29/2013 0933   TRIG 62.0 06/29/2013 0933   HDL 59.20 06/29/2013 0933   CHOLHDL 3 06/29/2013 0933   VLDL 12.4 06/29/2013 0933   LDLCALC 123* 06/29/2013 0933         Assessment and plan:   1. Nonischemic cardiomyopathy  - COMPLETE METABOLIC PANEL WITH GFR; Future  2. Anxiety and depression Following up with the psychiatrist and is on risperidone and Xanax.  3. Screening  - Vit D  25 hydroxy (rtn osteoporosis monitoring); Future  4. Other and unspecified hyperlipidemia  - Lipid panel; Future  5. Right foot pain Have advised patient to use comfortable shoes. - Ambulatory referral to [place] Maintenance -Colonoscopy: uptodate   -Mammogram: uptodate   Return in about 3 months (around 05/15/2014) for hypertension.   Lorayne Marek, MD

## 2014-02-12 NOTE — Progress Notes (Signed)
Patient here to establish care Has a pace maker and follows up with her cardiologist

## 2014-02-12 NOTE — Patient Instructions (Signed)
DASH Diet  The DASH diet stands for "Dietary Approaches to Stop Hypertension." It is a healthy eating plan that has been shown to reduce high blood pressure (hypertension) in as little as 14 days, while also possibly providing other significant health benefits. These other health benefits include reducing the risk of breast cancer after menopause and reducing the risk of type 2 diabetes, heart disease, colon cancer, and stroke. Health benefits also include weight loss and slowing kidney failure in patients with chronic kidney disease.   DIET GUIDELINES  · Limit salt (sodium). Your diet should contain less than 1500 mg of sodium daily.  · Limit refined or processed carbohydrates. Your diet should include mostly whole grains. Desserts and added sugars should be used sparingly.  · Include small amounts of heart-healthy fats. These types of fats include nuts, oils, and tub margarine. Limit saturated and trans fats. These fats have been shown to be harmful in the body.  CHOOSING FOODS   The following food groups are based on a 2000 calorie diet. See your Registered Dietitian for individual calorie needs.  Grains and Grain Products (6 to 8 servings daily)  · Eat More Often: Whole-wheat bread, brown rice, whole-grain or wheat pasta, quinoa, popcorn without added fat or salt (air popped).  · Eat Less Often: White bread, white pasta, white rice, cornbread.  Vegetables (4 to 5 servings daily)  · Eat More Often: Fresh, frozen, and canned vegetables. Vegetables may be raw, steamed, roasted, or grilled with a minimal amount of fat.  · Eat Less Often/Avoid: Creamed or fried vegetables. Vegetables in a cheese sauce.  Fruit (4 to 5 servings daily)  · Eat More Often: All fresh, canned (in natural juice), or frozen fruits. Dried fruits without added sugar. One hundred percent fruit juice (½ cup [237 mL] daily).  · Eat Less Often: Dried fruits with added sugar. Canned fruit in light or heavy syrup.  Lean Meats, Fish, and Poultry (2  servings or less daily. One serving is 3 to 4 oz [85-114 g]).  · Eat More Often: Ninety percent or leaner ground beef, tenderloin, sirloin. Round cuts of beef, chicken breast, turkey breast. All fish. Grill, bake, or broil your meat. Nothing should be fried.  · Eat Less Often/Avoid: Fatty cuts of meat, turkey, or chicken leg, thigh, or wing. Fried cuts of meat or fish.  Dairy (2 to 3 servings)  · Eat More Often: Low-fat or fat-free milk, low-fat plain or light yogurt, reduced-fat or part-skim cheese.  · Eat Less Often/Avoid: Milk (whole, 2%). Whole milk yogurt. Full-fat cheeses.  Nuts, Seeds, and Legumes (4 to 5 servings per week)  · Eat More Often: All without added salt.  · Eat Less Often/Avoid: Salted nuts and seeds, canned beans with added salt.  Fats and Sweets (limited)  · Eat More Often: Vegetable oils, tub margarines without trans fats, sugar-free gelatin. Mayonnaise and salad dressings.  · Eat Less Often/Avoid: Coconut oils, palm oils, butter, stick margarine, cream, half and half, cookies, candy, pie.  FOR MORE INFORMATION  The Dash Diet Eating Plan: www.dashdiet.org  Document Released: 08/06/2011 Document Revised: 11/09/2011 Document Reviewed: 08/06/2011  Exit[employer]® Patient Information ©2014 [employer].

## 2014-02-19 ENCOUNTER — Telehealth: Payer: Self-pay | Admitting: Cardiology

## 2014-02-19 ENCOUNTER — Encounter: Payer: Medicaid Other | Admitting: *Deleted

## 2014-02-19 NOTE — Telephone Encounter (Signed)
Called pt to verify remote transmission. Pt has [place] phone service. I will mail a phone filter to pt today.

## 2014-02-21 ENCOUNTER — Encounter: Payer: Self-pay | Admitting: Cardiology

## 2014-02-22 ENCOUNTER — Ambulatory Visit: Payer: Self-pay | Admitting: Internal Medicine

## 2014-03-01 ENCOUNTER — Telehealth: Payer: Self-pay | Admitting: *Deleted

## 2014-03-01 NOTE — Telephone Encounter (Signed)
Pt needs DSL filter for Nancy Drake. Mailed filter today from clinic.

## 2014-03-14 ENCOUNTER — Encounter: Payer: Self-pay | Admitting: Podiatrist

## 2014-03-14 ENCOUNTER — Ambulatory Visit (INDEPENDENT_AMBULATORY_CARE_PROVIDER_SITE_OTHER): Payer: Medicaid Other

## 2014-03-14 ENCOUNTER — Ambulatory Visit (INDEPENDENT_AMBULATORY_CARE_PROVIDER_SITE_OTHER): Payer: Medicaid Other | Admitting: Podiatrist

## 2014-03-14 VITALS — BP 161/91 | HR 76 | Resp 16 | Ht 62.0 in | Wt 130.0 lb

## 2014-03-14 DIAGNOSIS — M7750 Other enthesopathy of unspecified foot: Secondary | ICD-10-CM

## 2014-03-14 DIAGNOSIS — M722 Plantar fascial fibromatosis: Secondary | ICD-10-CM

## 2014-03-14 NOTE — Progress Notes (Signed)
   Subjective:    Patient ID: Nancy Drake, female    DOB: Dec 12, 1960, 53 y.o.   MRN: [id number]  HPI Comments: Pain in the right plantar foot, arch to heel pain. Its been going on for about 3 months now. Sharp pain   Foot Pain      Review of Systems  All other systems reviewed and are negative.      Objective:   Physical Exam Patient is awake, alert, and oriented x 3.  In no acute distress.  Vascular status is intact with palpable pedal pulses at 2/4 DP and PT bilateral and capillary refill time within normal limits. Neurological sensation is also intact bilaterally via Semmes Weinstein monofilament at 5/5 sites. Light touch, vibratory sensation, Achilles tendon reflex is intact. Dermatological exam reveals skin color, turger and texture as normal. No open lesions present.  Musculature intact with dorsiflexion, plantarflexion, inversion, eversion.  Pain on palpation plantar medial right foot is noted at the plantar fascia and along the posterior tibial tendon insertion.  Generalized pain to the region is present.      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  I injected the plantar fascia with kenalog and marcaine plain under sterile technique.  i will see her back for a recheck.

## 2014-03-14 NOTE — Patient Instructions (Signed)
Plantar Fasciitis (Heel Spur Syndrome) with Rehab The plantar fascia is a fibrous, ligament-like, soft-tissue structure that spans the bottom of the foot. Plantar fasciitis is a condition that causes pain in the foot due to inflammation of the tissue. SYMPTOMS   Pain and tenderness on the underneath side of the foot.  Pain that worsens with standing or walking. CAUSES  Plantar fasciitis is caused by irritation and injury to the plantar fascia on the underneath side of the foot. Common mechanisms of injury include:  Direct trauma to bottom of the foot.  Damage to a small nerve that runs under the foot where the main fascia attaches to the heel bone.  Stress placed on the plantar fascia due to bone spurs. RISK INCREASES WITH:   Activities that place stress on the plantar fascia (running, jumping, pivoting, or cutting).  Poor strength and flexibility.  Improperly fitted shoes.  Tight calf muscles.  Flat feet.  Failure to warm-up properly before activity.  Obesity. PREVENTION  Warm up and stretch properly before activity.  Allow for adequate recovery between workouts.  Maintain physical fitness:  Strength, flexibility, and endurance.  Cardiovascular fitness.  Maintain a health body weight.  Avoid stress on the plantar fascia.  Wear properly fitted shoes, including arch supports for individuals who have flat feet. PROGNOSIS  If treated properly, then the symptoms of plantar fasciitis usually resolve without surgery. However, occasionally surgery is necessary. RELATED COMPLICATIONS   Recurrent symptoms that may result in a chronic condition.  Problems of the lower back that are caused by compensating for the injury, such as limping.  Pain or weakness of the foot during push-off following surgery.  Chronic inflammation, scarring, and partial or complete fascia tear, occurring more often from repeated injections. TREATMENT  Treatment initially involves the use of  ice and medication to help reduce pain and inflammation. The use of strengthening and stretching exercises may help reduce pain with activity, especially stretches of the Achilles tendon. These exercises may be performed at home or with a therapist. Your caregiver may recommend that you use heel cups of arch supports to help reduce stress on the plantar fascia. Occasionally, corticosteroid injections are given to reduce inflammation. If symptoms persist for greater than 6 months despite non-surgical (conservative), then surgery may be recommended.  MEDICATION   If pain medication is necessary, then nonsteroidal anti-inflammatory medications, such as aspirin and ibuprofen, or other minor pain relievers, such as acetaminophen, are often recommended.  Do not take pain medication within 7 days before surgery.  Prescription pain relievers may be given if deemed necessary by your caregiver. Use only as directed and only as much as you need.  Corticosteroid injections may be given by your caregiver. These injections should be reserved for the most serious cases, because they may only be given a certain number of times. HEAT AND COLD  Cold treatment (icing) relieves pain and reduces inflammation. Cold treatment should be applied for 10 to 15 minutes every 2 to 3 hours for inflammation and pain and immediately after any activity that aggravates your symptoms. Use ice packs or massage the area with a piece of ice (ice massage).  Heat treatment may be used prior to performing the stretching and strengthening activities prescribed by your caregiver, physical therapist, or athletic trainer. Use a heat pack or soak the injury in warm water. SEEK IMMEDIATE MEDICAL CARE IF:  Treatment seems to offer no benefit, or the condition worsens.  Any medications produce adverse side effects. EXERCISES RANGE   OF MOTION (ROM) AND STRETCHING EXERCISES - Plantar Fasciitis (Heel Spur Syndrome) These exercises may help you  when beginning to rehabilitate your injury. Your symptoms may resolve with or without further involvement from your physician, physical therapist or athletic trainer. While completing these exercises, remember:   Restoring tissue flexibility helps normal motion to return to the joints. This allows healthier, less painful movement and activity.  An effective stretch should be held for at least 30 seconds.  A stretch should never be painful. You should only feel a gentle lengthening or release in the stretched tissue. RANGE OF MOTION - Toe Extension, Flexion  Sit with your right / left leg crossed over your opposite knee.  Grasp your toes and gently pull them back toward the top of your foot. You should feel a stretch on the bottom of your toes and/or foot.  Hold this stretch for __________ seconds.  Now, gently pull your toes toward the bottom of your foot. You should feel a stretch on the top of your toes and or foot.  Hold this stretch for __________ seconds. Repeat __________ times. Complete this stretch __________ times per day.  RANGE OF MOTION - Ankle Dorsiflexion, Active Assisted  Remove shoes and sit on a chair that is preferably not on a carpeted surface.  Place right / left foot under knee. Extend your opposite leg for support.  Keeping your heel down, slide your right / left foot back toward the chair until you feel a stretch at your ankle or calf. If you do not feel a stretch, slide your bottom forward to the edge of the chair, while still keeping your heel down.  Hold this stretch for __________ seconds. Repeat __________ times. Complete this stretch __________ times per day.  STRETCH - Gastroc, Standing  Place hands on wall.  Extend right / left leg, keeping the front knee somewhat bent.  Slightly point your toes inward on your back foot.  Keeping your right / left heel on the floor and your knee straight, shift your weight toward the wall, not allowing your back to  arch.  You should feel a gentle stretch in the right / left calf. Hold this position for __________ seconds. Repeat __________ times. Complete this stretch __________ times per day. STRETCH - Soleus, Standing  Place hands on wall.  Extend right / left leg, keeping the other knee somewhat bent.  Slightly point your toes inward on your back foot.  Keep your right / left heel on the floor, bend your back knee, and slightly shift your weight over the back leg so that you feel a gentle stretch deep in your back calf.  Hold this position for __________ seconds. Repeat __________ times. Complete this stretch __________ times per day. STRETCH - Gastrocsoleus, Standing  Note: This exercise can place a lot of stress on your foot and ankle. Please complete this exercise only if specifically instructed by your caregiver.   Place the ball of your right / left foot on a step, keeping your other foot firmly on the same step.  Hold on to the wall or a rail for balance.  Slowly lift your other foot, allowing your body weight to press your heel down over the edge of the step.  You should feel a stretch in your right / left calf.  Hold this position for __________ seconds.  Repeat this exercise with a slight bend in your right / left knee. Repeat __________ times. Complete this stretch __________ times per day.    STRENGTHENING EXERCISES - Plantar Fasciitis (Heel Spur Syndrome)  These exercises may help you when beginning to rehabilitate your injury. They may resolve your symptoms with or without further involvement from your physician, physical therapist or athletic trainer. While completing these exercises, remember:   Muscles can gain both the endurance and the strength needed for everyday activities through controlled exercises.  Complete these exercises as instructed by your physician, physical therapist or athletic trainer. Progress the resistance and repetitions only as guided. STRENGTH -  Towel Curls  Sit in a chair positioned on a non-carpeted surface.  Place your foot on a towel, keeping your heel on the floor.  Pull the towel toward your heel by only curling your toes. Keep your heel on the floor.  If instructed by your physician, physical therapist or athletic trainer, add ____________________ at the end of the towel. Repeat __________ times. Complete this exercise __________ times per day. STRENGTH - Ankle Inversion  Secure one end of a rubber exercise band/tubing to a fixed object (table, pole). Loop the other end around your foot just before your toes.  Place your fists between your knees. This will focus your strengthening at your ankle.  Slowly, pull your big toe up and in, making sure the band/tubing is positioned to resist the entire motion.  Hold this position for __________ seconds.  Have your muscles resist the band/tubing as it slowly pulls your foot back to the starting position. Repeat __________ times. Complete this exercises __________ times per day.  Document Released: 08/17/2005 Document Revised: 11/09/2011 Document Reviewed: 11/29/2008 Exit[employer] Patient Information 2015 [employer]. This information is not intended to replace advice given to you by your health care provider. Make sure you discuss any questions you have with your health care provider.  

## 2014-03-20 ENCOUNTER — Ambulatory Visit (INDEPENDENT_AMBULATORY_CARE_PROVIDER_SITE_OTHER): Payer: Medicaid Other | Admitting: *Deleted

## 2014-03-20 DIAGNOSIS — I428 Other cardiomyopathies: Secondary | ICD-10-CM

## 2014-03-20 DIAGNOSIS — I442 Atrioventricular block, complete: Secondary | ICD-10-CM

## 2014-03-20 LAB — MDC_IDC_ENUM_SESS_TYPE_REMOTE
Battery Voltage: 3.02 V
Brady Statistic AP VS Percent: 0.01 %
Brady Statistic AS VP Percent: 99.8 %
Brady Statistic RA Percent Paced: 0.16 %
Lead Channel Impedance Value: 323 Ohm
Lead Channel Impedance Value: 399 Ohm
Lead Channel Impedance Value: 494 Ohm
Lead Channel Impedance Value: 646 Ohm
Lead Channel Impedance Value: 779 Ohm
Lead Channel Pacing Threshold Amplitude: 0.625 V
Lead Channel Pacing Threshold Pulse Width: 0.4 ms
Lead Channel Pacing Threshold Pulse Width: 0.4 ms
Lead Channel Pacing Threshold Pulse Width: 0.4 ms
Lead Channel Sensing Intrinsic Amplitude: 3.125 mV
Lead Channel Sensing Intrinsic Amplitude: 31.625 mV
Lead Channel Setting Pacing Amplitude: 1.75 V
MDC IDC MSMT BATTERY REMAINING LONGEVITY: 68 mo
MDC IDC MSMT LEADCHNL LV IMPEDANCE VALUE: 1007 Ohm
MDC IDC MSMT LEADCHNL LV IMPEDANCE VALUE: 475 Ohm
MDC IDC MSMT LEADCHNL LV IMPEDANCE VALUE: 608 Ohm
MDC IDC MSMT LEADCHNL LV PACING THRESHOLD AMPLITUDE: 0.625 V
MDC IDC MSMT LEADCHNL RA IMPEDANCE VALUE: 399 Ohm
MDC IDC MSMT LEADCHNL RV PACING THRESHOLD AMPLITUDE: 0.875 V
MDC IDC SESS DTM: 20150721092318
MDC IDC SET LEADCHNL LV PACING PULSEWIDTH: 0.4 ms
MDC IDC SET LEADCHNL RA PACING AMPLITUDE: 2 V
MDC IDC SET LEADCHNL RV PACING AMPLITUDE: 2.5 V
MDC IDC SET LEADCHNL RV PACING PULSEWIDTH: 0.4 ms
MDC IDC SET LEADCHNL RV SENSING SENSITIVITY: 0.9 mV
MDC IDC SET ZONE DETECTION INTERVAL: 350 ms
MDC IDC SET ZONE DETECTION INTERVAL: 400 ms
MDC IDC STAT BRADY AP VP PERCENT: 0.15 %
MDC IDC STAT BRADY AS VS PERCENT: 0.04 %
MDC IDC STAT BRADY RV PERCENT PACED: 99.95 %

## 2014-03-21 NOTE — Progress Notes (Signed)
Remote pacemaker transmission.   

## 2014-04-24 ENCOUNTER — Encounter: Payer: Self-pay | Admitting: Cardiology

## 2014-05-01 ENCOUNTER — Encounter: Payer: Self-pay | Admitting: Internal Medicine

## 2014-05-09 ENCOUNTER — Other Ambulatory Visit: Payer: Medicaid Other

## 2014-05-16 ENCOUNTER — Ambulatory Visit: Payer: Medicaid Other | Admitting: Internal Medicine

## 2014-06-15 ENCOUNTER — Other Ambulatory Visit: Payer: Self-pay | Admitting: Internal Medicine

## 2014-06-25 ENCOUNTER — Telehealth: Payer: Self-pay | Admitting: Cardiology

## 2014-06-25 ENCOUNTER — Telehealth: Payer: Self-pay | Admitting: Internal Medicine

## 2014-06-25 ENCOUNTER — Ambulatory Visit (INDEPENDENT_AMBULATORY_CARE_PROVIDER_SITE_OTHER): Payer: Medicaid Other | Admitting: *Deleted

## 2014-06-25 DIAGNOSIS — I442 Atrioventricular block, complete: Secondary | ICD-10-CM

## 2014-06-25 LAB — MDC_IDC_ENUM_SESS_TYPE_REMOTE
Battery Remaining Longevity: 67 mo
Battery Voltage: 3.01 V
Brady Statistic AS VP Percent: 99.86 %
Brady Statistic AS VS Percent: 0.04 %
Brady Statistic RA Percent Paced: 0.1 %
Lead Channel Impedance Value: 323 Ohm
Lead Channel Impedance Value: 399 Ohm
Lead Channel Impedance Value: 475 Ohm
Lead Channel Impedance Value: 513 Ohm
Lead Channel Impedance Value: 608 Ohm
Lead Channel Impedance Value: 722 Ohm
Lead Channel Pacing Threshold Amplitude: 0.625 V
Lead Channel Pacing Threshold Amplitude: 0.875 V
Lead Channel Pacing Threshold Pulse Width: 0.4 ms
Lead Channel Sensing Intrinsic Amplitude: 31.625 mV
Lead Channel Sensing Intrinsic Amplitude: 31.625 mV
Lead Channel Setting Pacing Amplitude: 2 V
Lead Channel Setting Pacing Amplitude: 2.5 V
Lead Channel Setting Pacing Pulse Width: 0.4 ms
Lead Channel Setting Sensing Sensitivity: 0.9 mV
MDC IDC MSMT LEADCHNL LV IMPEDANCE VALUE: 589 Ohm
MDC IDC MSMT LEADCHNL LV IMPEDANCE VALUE: 912 Ohm
MDC IDC MSMT LEADCHNL RA IMPEDANCE VALUE: 399 Ohm
MDC IDC MSMT LEADCHNL RA PACING THRESHOLD AMPLITUDE: 0.625 V
MDC IDC MSMT LEADCHNL RA PACING THRESHOLD PULSEWIDTH: 0.4 ms
MDC IDC MSMT LEADCHNL RA SENSING INTR AMPL: 2.75 mV
MDC IDC MSMT LEADCHNL RA SENSING INTR AMPL: 2.75 mV
MDC IDC MSMT LEADCHNL RV PACING THRESHOLD PULSEWIDTH: 0.4 ms
MDC IDC SESS DTM: 20151026090042
MDC IDC SET LEADCHNL LV PACING AMPLITUDE: 1.75 V
MDC IDC SET LEADCHNL LV PACING PULSEWIDTH: 0.4 ms
MDC IDC STAT BRADY AP VP PERCENT: 0.09 %
MDC IDC STAT BRADY AP VS PERCENT: 0.01 %
MDC IDC STAT BRADY RV PERCENT PACED: 99.95 %
Zone Setting Detection Interval: 350 ms
Zone Setting Detection Interval: 400 ms

## 2014-06-25 NOTE — Telephone Encounter (Signed)
800# given. Patient states that she would attempt to send the transmission again sometime tomorrow.

## 2014-06-25 NOTE — Telephone Encounter (Signed)
Spoke with pt and reminded pt of remote transmission that is due today. Pt verbalized understanding.   

## 2014-06-25 NOTE — Telephone Encounter (Signed)
New message ° ° ° ° ° ° ° ° °Pt would like to know if you received her transmission °

## 2014-06-26 NOTE — Progress Notes (Signed)
Remote pacemaker transmission.   

## 2014-07-03 ENCOUNTER — Encounter: Payer: BC Managed Care – PPO | Admitting: Internal Medicine

## 2014-07-16 ENCOUNTER — Other Ambulatory Visit: Payer: Self-pay | Admitting: Cardiology

## 2014-08-09 ENCOUNTER — Encounter (HOSPITAL_COMMUNITY): Payer: Self-pay | Admitting: Cardiovascular Disease

## 2014-09-06 ENCOUNTER — Encounter: Payer: Self-pay | Admitting: Cardiology

## 2014-09-10 DIAGNOSIS — M79673 Pain in unspecified foot: Secondary | ICD-10-CM

## 2014-09-11 ENCOUNTER — Encounter: Payer: Self-pay | Admitting: Internal Medicine

## 2014-09-26 ENCOUNTER — Ambulatory Visit (INDEPENDENT_AMBULATORY_CARE_PROVIDER_SITE_OTHER): Payer: 59 | Admitting: *Deleted

## 2014-09-26 DIAGNOSIS — I442 Atrioventricular block, complete: Secondary | ICD-10-CM

## 2014-09-26 LAB — MDC_IDC_ENUM_SESS_TYPE_REMOTE
Battery Remaining Longevity: 67 mo
Battery Voltage: 3.01 V
Brady Statistic AP VP Percent: 0.02 %
Brady Statistic AP VS Percent: 0.01 %
Brady Statistic AS VP Percent: 99.92 %
Brady Statistic AS VS Percent: 0.04 %
Brady Statistic RA Percent Paced: 0.03 %
Brady Statistic RV Percent Paced: 99.95 %
Date Time Interrogation Session: 20160127013506
Lead Channel Impedance Value: 323 Ohm
Lead Channel Impedance Value: 380 Ohm
Lead Channel Impedance Value: 399 Ohm
Lead Channel Impedance Value: 456 Ohm
Lead Channel Impedance Value: 513 Ohm
Lead Channel Impedance Value: 589 Ohm
Lead Channel Impedance Value: 608 Ohm
Lead Channel Impedance Value: 722 Ohm
Lead Channel Pacing Threshold Amplitude: 0.75 V
Lead Channel Pacing Threshold Amplitude: 0.875 V
Lead Channel Pacing Threshold Amplitude: 1 V
Lead Channel Pacing Threshold Pulse Width: 0.4 ms
Lead Channel Pacing Threshold Pulse Width: 0.4 ms
Lead Channel Sensing Intrinsic Amplitude: 31.625 mV
Lead Channel Setting Pacing Amplitude: 2 V
Lead Channel Setting Pacing Amplitude: 2.5 V
Lead Channel Setting Pacing Pulse Width: 0.4 ms
Lead Channel Setting Sensing Sensitivity: 0.9 mV
MDC IDC MSMT LEADCHNL LV IMPEDANCE VALUE: 893 Ohm
MDC IDC MSMT LEADCHNL RA SENSING INTR AMPL: 2.625 mV
MDC IDC MSMT LEADCHNL RV PACING THRESHOLD PULSEWIDTH: 0.4 ms
MDC IDC SET LEADCHNL LV PACING AMPLITUDE: 2 V
MDC IDC SET LEADCHNL RV PACING PULSEWIDTH: 0.4 ms
MDC IDC SET ZONE DETECTION INTERVAL: 350 ms
MDC IDC SET ZONE DETECTION INTERVAL: 400 ms

## 2014-09-26 NOTE — Progress Notes (Signed)
Remote pacemaker transmission.   

## 2014-11-06 ENCOUNTER — Telehealth: Payer: Self-pay | Admitting: Internal Medicine

## 2014-11-06 NOTE — Telephone Encounter (Signed)
New Message  Pt wanted Dr. Caryl Comes to sign off (referral) on [facility]. Please call back and discuss.

## 2014-11-08 NOTE — Telephone Encounter (Signed)
Patient is calling back to see about the sign off (referral) Please give her a call.

## 2014-11-08 NOTE — Telephone Encounter (Signed)
Explained that Dr. Caryl Comes is agreeable to signing for massage therapy. Patient informed she will get form for Dr. Caryl Comes to sign and bring it to office for him to sign.

## 2014-11-12 ENCOUNTER — Telehealth: Payer: Self-pay | Admitting: Internal Medicine

## 2014-11-12 NOTE — Telephone Encounter (Signed)
Follow Up        Pt calling stating that she is calling to find out if her referral is ready for her to pick up because her appt is this week. Please call back and advise.

## 2014-11-13 NOTE — Telephone Encounter (Signed)
Left detailed message on personal cell phone that Dr. Caryl Comes cannot refer for massage therapy, per his statement. (misread - and thought referral was for family doctor) Advised patient to call back if she had further questions.

## 2014-11-15 ENCOUNTER — Encounter: Payer: Self-pay | Admitting: Cardiology

## 2014-11-28 ENCOUNTER — Encounter: Payer: Self-pay | Admitting: Internal Medicine

## 2014-11-29 ENCOUNTER — Encounter: Payer: Self-pay | Admitting: Internal Medicine

## 2014-11-29 ENCOUNTER — Ambulatory Visit (INDEPENDENT_AMBULATORY_CARE_PROVIDER_SITE_OTHER): Payer: 59 | Admitting: Internal Medicine

## 2014-11-29 VITALS — BP 178/90 | HR 68 | Ht 62.0 in | Wt 146.8 lb

## 2014-11-29 DIAGNOSIS — I428 Other cardiomyopathies: Secondary | ICD-10-CM

## 2014-11-29 DIAGNOSIS — I10 Essential (primary) hypertension: Secondary | ICD-10-CM | POA: Diagnosis not present

## 2014-11-29 DIAGNOSIS — I429 Cardiomyopathy, unspecified: Secondary | ICD-10-CM

## 2014-11-29 DIAGNOSIS — Z45018 Encounter for adjustment and management of other part of cardiac pacemaker: Secondary | ICD-10-CM

## 2014-11-29 DIAGNOSIS — I442 Atrioventricular block, complete: Secondary | ICD-10-CM | POA: Diagnosis not present

## 2014-11-29 DIAGNOSIS — Z950 Presence of cardiac pacemaker: Secondary | ICD-10-CM | POA: Diagnosis not present

## 2014-11-29 DIAGNOSIS — I5022 Chronic systolic (congestive) heart failure: Secondary | ICD-10-CM

## 2014-11-29 LAB — MDC_IDC_ENUM_SESS_TYPE_INCLINIC
Battery Voltage: 3.01 V
Brady Statistic RA Percent Paced: 0.1 %
Brady Statistic RV Percent Paced: 99.95 %
Date Time Interrogation Session: 20160331075701
Lead Channel Impedance Value: 304 Ohm
Lead Channel Impedance Value: 475 Ohm
Lead Channel Impedance Value: 570 Ohm
Lead Channel Impedance Value: 627 Ohm
Lead Channel Pacing Threshold Amplitude: 0.875 V
Lead Channel Pacing Threshold Pulse Width: 0.4 ms
Lead Channel Pacing Threshold Pulse Width: 0.4 ms
Lead Channel Pacing Threshold Pulse Width: 0.4 ms
Lead Channel Sensing Intrinsic Amplitude: 2.875 mV
Lead Channel Setting Pacing Amplitude: 1.75 V
Lead Channel Setting Pacing Amplitude: 2 V
Lead Channel Setting Pacing Amplitude: 2.5 V
Lead Channel Setting Pacing Pulse Width: 0.4 ms
Lead Channel Setting Sensing Sensitivity: 0.9 mV
MDC IDC MSMT BATTERY REMAINING LONGEVITY: 66 mo
MDC IDC MSMT LEADCHNL LV IMPEDANCE VALUE: 475 Ohm
MDC IDC MSMT LEADCHNL LV IMPEDANCE VALUE: 722 Ohm
MDC IDC MSMT LEADCHNL LV IMPEDANCE VALUE: 931 Ohm
MDC IDC MSMT LEADCHNL LV PACING THRESHOLD AMPLITUDE: 0.75 V
MDC IDC MSMT LEADCHNL RA IMPEDANCE VALUE: 380 Ohm
MDC IDC MSMT LEADCHNL RA PACING THRESHOLD AMPLITUDE: 0.625 V
MDC IDC MSMT LEADCHNL RA SENSING INTR AMPL: 2.5 mV
MDC IDC MSMT LEADCHNL RV IMPEDANCE VALUE: 380 Ohm
MDC IDC MSMT LEADCHNL RV SENSING INTR AMPL: 31.625 mV
MDC IDC MSMT LEADCHNL RV SENSING INTR AMPL: 31.625 mV
MDC IDC SET LEADCHNL RV PACING PULSEWIDTH: 0.4 ms
MDC IDC SET ZONE DETECTION INTERVAL: 400 ms
MDC IDC STAT BRADY AP VP PERCENT: 0.09 %
MDC IDC STAT BRADY AP VS PERCENT: 0.01 %
MDC IDC STAT BRADY AS VP PERCENT: 99.86 %
MDC IDC STAT BRADY AS VS PERCENT: 0.04 %
Zone Setting Detection Interval: 350 ms

## 2014-11-29 MED ORDER — CARVEDILOL 25 MG PO TABS: 25.0000 mg | ORAL_TABLET | Freq: Two times a day (BID) | ORAL | Status: DC

## 2014-11-29 NOTE — Progress Notes (Signed)
Patient Care Team: Lorayne Marek, MD as PCP - General (Internal Medicine) [name], MD (Psychiatry)   HPI  Nancy Drake is a 54 y.o. female Seen in followup for nonischemic cardiomyopathy with left bundle branch block and syncope. She is status post CRT P. Implantation. She is on ACE inhibitors and beta blockers. Her ejection fraction remains modestly reduced at 40-45% confirmed by echo 2/14      The patient denies chest pain, shortness of breath, nocturnal dyspnea, orthopnea or peripheral edema.  There have been no palpitations, lightheadedness or syncope.   She is now working with [employer]     Past Medical History  Diagnosis Date  . Anxiety     lost a child , sees psych  . Cardiomyopathy nonischemic     dx after a syncope 10-2010  . Complete heart block 03-2012  . Biventricular cardiac pacemaker -Medtronic     DOI 2013  . Thyroid disease     used to see Dr Debbora Presto  . Mole of skin     bx 05-2013    Past Surgical History  Procedure Laterality Date  . Cesarean section    . Pacemaker insertion  03-2012  . Colonoscopy      2010  . Temporary pacemaker insertion Right 04/08/2012    Procedure: TEMPORARY PACEMAKER INSERTION;  Surgeon: Burnell Blanks, MD;  Location: [facility] C[facility] LAB;  Service: Cardiovascular;  Laterality: Right;  . Bi-ventricular pacemaker insertion N/A 04/11/2012    Procedure: BI-VENTRICULAR PACEMAKER INSERTION (CRT-P);  Surgeon: Deboraha Sprang, MD;  Location: [facility] CATH LAB;  Service: Cardiovascular;  Laterality: N/A;  . Lead revision N/A 04/12/2012    Procedure: LEAD REVISION;  Surgeon: Evans Lance, MD;  Location: [facility] CATH LAB;  Service: Cardiovascular;  Laterality: N/A;    Current Outpatient Prescriptions  Medication Sig Dispense Refill  . acetaminophen (TYLENOL) 500 MG tablet Take 500 mg by mouth every 6 (six) hours as needed.     . ALPRAZolam (XANAX) 0.25 MG tablet Take 0.25 mg by mouth at bedtime as needed for anxiety.    Marland Kitchen  BIOTIN PO Take 1,000 mg by mouth daily.    Marland Kitchen CALCIUM-VITAMIN D PO Take by mouth. Pt. Unsure of dosage    . carvedilol (COREG) 12.5 MG tablet TAKE 1 TABLET BY MOUTH TWICE DAILY WITH A MEAL 60 tablet 5  . fish oil-omega-3 fatty acids 1000 MG capsule Take 2 g by mouth daily.    Marland Kitchen lisinopril (PRINIVIL,ZESTRIL) 5 MG tablet TAKE 1 TABLET BY MOUTH TWICE DAILY (Patient taking differently: TAKE 1 TABLET BY MOUTH ONCE DAILY) 180 tablet 6  . Multiple Vitamin (MULTIVITAMIN WITH MINERALS) TABS Take 1 tablet by mouth daily.    . risperiDONE (RISPERDAL) 1 MG tablet Take 1 mg by mouth at bedtime.     No current facility-administered medications for this visit.    Allergies  Allergen Reactions  . Ambien [Zolpidem Tartrate] Other (See Comments)    Abnormal dreams  . Desloratadine     Insomnia     Review of Systems negative except from HPI and PMH  Physical Exam BP 178/90 mmHg  Pulse 68  Ht 5\' 2"  (1.575 m)  Wt 146 lb 12.8 oz (66.588 kg)  BMI 26.84 kg/m2 Well developed and well nourished in no acute distress HENT normal E scleral and icterus clear Neck Supple JVP flat; carotids brisk and full Clear to ausculation  The patient's device was interrogated.  The information was reviewed. No  changes were made in the programming.   Regular rate and rhythm, no murmurs gallops or rub Soft with active bowel sounds No clubbing cyanosis all Edema Alert and oriented, grossly normal motor and sensory function Skin Warm and Dry  ECG  P-synchronous/ AV  pacing   Assessment and  Plan  Nonischemic cardiomyopathy  CHF  Chronic systolic  ICD Medtronic The patient's device was interrogated.  The information was reviewed. No changes were made in the programming.     Hypertension   Reassessment of her blood pressure to 997 systolic. We will plan to increase her carvedilol from 12.5 twice a day--25 twice a day beginning today. I've asked her to check her blood pressures at home with her father blood  pressure machine and to follow-up with her PCP soon

## 2014-11-29 NOTE — Patient Instructions (Signed)
Your physician has recommended you make the following change in your medication:  1) INCREASE Carvedilol to 25 mg twice daily  Remote monitoring is used to monitor your Pacemaker of ICD from home. This monitoring reduces the number of office visits required to check your device to one time per year. It allows [place] to keep an eye on the functioning of your device to ensure it is working properly. You are scheduled for a device check from home on 02/28/15. You may send your transmission at any time that day. If you have a wireless device, the transmission will be sent automatically. After your physician reviews your transmission, you will receive a postcard with your next transmission date.  Your physician wants you to follow-up in: 1 year with Dr. Caryl Comes.  You will receive a reminder letter in the mail two months in advance. If you don't receive a letter, please call our office to schedule the follow-up appointment.

## 2014-12-20 ENCOUNTER — Encounter: Payer: Self-pay | Admitting: Internal Medicine

## 2015-01-09 ENCOUNTER — Telehealth: Payer: Self-pay | Admitting: *Deleted

## 2015-01-09 NOTE — Telephone Encounter (Signed)
Signed medical release received via fax from [facility] Requesting all records. Forwarded to [place] to scan/email to medical records. JG//CMA

## 2015-01-25 ENCOUNTER — Ambulatory Visit
Admission: RE | Admit: 2015-01-25 | Discharge: 2015-01-25 | Disposition: A | Discharge status: Home / Self Care | Payer: 59 | Source: Ambulatory Visit | Attending: Family Medicine | Admitting: Family Medicine

## 2015-01-25 ENCOUNTER — Other Ambulatory Visit: Payer: Self-pay | Admitting: Family Medicine

## 2015-01-25 DIAGNOSIS — M5416 Radiculopathy, lumbar region: Secondary | ICD-10-CM

## 2015-02-15 ENCOUNTER — Telehealth: Payer: Self-pay | Admitting: Internal Medicine

## 2015-02-15 NOTE — Telephone Encounter (Signed)
error 

## 2015-02-27 DIAGNOSIS — I442 Atrioventricular block, complete: Secondary | ICD-10-CM

## 2015-02-28 ENCOUNTER — Ambulatory Visit (INDEPENDENT_AMBULATORY_CARE_PROVIDER_SITE_OTHER): Payer: 59 | Admitting: *Deleted

## 2015-02-28 DIAGNOSIS — I442 Atrioventricular block, complete: Secondary | ICD-10-CM

## 2015-02-28 NOTE — Progress Notes (Signed)
Remote pacemaker transmission.   

## 2015-03-04 LAB — CUP PACEART REMOTE DEVICE CHECK
Battery Voltage: 3.01 V
Brady Statistic AP VP Percent: 0.09 %
Brady Statistic AS VS Percent: 0.05 %
Brady Statistic RA Percent Paced: 0.1 %
Date Time Interrogation Session: 20160629041147
Lead Channel Impedance Value: 475 Ohm
Lead Channel Impedance Value: 551 Ohm
Lead Channel Impedance Value: 608 Ohm
Lead Channel Impedance Value: 893 Ohm
Lead Channel Pacing Threshold Amplitude: 0.5 V
Lead Channel Pacing Threshold Amplitude: 0.625 V
Lead Channel Pacing Threshold Pulse Width: 0.4 ms
Lead Channel Pacing Threshold Pulse Width: 0.4 ms
Lead Channel Sensing Intrinsic Amplitude: 2.75 mV
Lead Channel Sensing Intrinsic Amplitude: 2.75 mV
Lead Channel Sensing Intrinsic Amplitude: 31.625 mV
Lead Channel Setting Pacing Amplitude: 1.5 V
Lead Channel Setting Pacing Amplitude: 2 V
Lead Channel Setting Pacing Pulse Width: 0.4 ms
MDC IDC MSMT BATTERY REMAINING LONGEVITY: 67 mo
MDC IDC MSMT LEADCHNL LV IMPEDANCE VALUE: 703 Ohm
MDC IDC MSMT LEADCHNL LV PACING THRESHOLD PULSEWIDTH: 0.4 ms
MDC IDC MSMT LEADCHNL RA IMPEDANCE VALUE: 304 Ohm
MDC IDC MSMT LEADCHNL RA IMPEDANCE VALUE: 361 Ohm
MDC IDC MSMT LEADCHNL RV IMPEDANCE VALUE: 399 Ohm
MDC IDC MSMT LEADCHNL RV IMPEDANCE VALUE: 494 Ohm
MDC IDC MSMT LEADCHNL RV PACING THRESHOLD AMPLITUDE: 0.75 V
MDC IDC MSMT LEADCHNL RV SENSING INTR AMPL: 31.625 mV
MDC IDC SET LEADCHNL RV PACING AMPLITUDE: 2.5 V
MDC IDC SET LEADCHNL RV PACING PULSEWIDTH: 0.4 ms
MDC IDC SET LEADCHNL RV SENSING SENSITIVITY: 0.9 mV
MDC IDC SET ZONE DETECTION INTERVAL: 350 ms
MDC IDC SET ZONE DETECTION INTERVAL: 400 ms
MDC IDC STAT BRADY AP VS PERCENT: 0.01 %
MDC IDC STAT BRADY AS VP PERCENT: 99.85 %
MDC IDC STAT BRADY RV PERCENT PACED: 99.94 %

## 2015-04-05 ENCOUNTER — Encounter: Payer: Self-pay | Admitting: Cardiology

## 2015-04-12 ENCOUNTER — Encounter: Payer: Self-pay | Admitting: Internal Medicine

## 2015-06-03 ENCOUNTER — Encounter: Payer: Self-pay | Admitting: Internal Medicine

## 2015-06-03 ENCOUNTER — Ambulatory Visit (INDEPENDENT_AMBULATORY_CARE_PROVIDER_SITE_OTHER): Payer: 59 | Admitting: *Deleted

## 2015-06-03 ENCOUNTER — Telehealth: Payer: Self-pay | Admitting: Cardiology

## 2015-06-03 DIAGNOSIS — I442 Atrioventricular block, complete: Secondary | ICD-10-CM

## 2015-06-03 NOTE — Telephone Encounter (Signed)
LMOVM reminding pt to send remote transmission.   

## 2015-06-04 NOTE — Progress Notes (Signed)
Remote pacemaker transmission.   

## 2015-06-06 LAB — CUP PACEART REMOTE DEVICE CHECK
Battery Remaining Longevity: 68 mo
Battery Voltage: 3.01 V
Brady Statistic AP VP Percent: 0.05 %
Brady Statistic AS VS Percent: 0.04 %
Brady Statistic RA Percent Paced: 0.06 %
Brady Statistic RV Percent Paced: 99.95 %
Date Time Interrogation Session: 20161003062347
Lead Channel Impedance Value: 323 Ohm
Lead Channel Impedance Value: 399 Ohm
Lead Channel Impedance Value: 418 Ohm
Lead Channel Impedance Value: 494 Ohm
Lead Channel Impedance Value: 513 Ohm
Lead Channel Impedance Value: 589 Ohm
Lead Channel Impedance Value: 627 Ohm
Lead Channel Impedance Value: 741 Ohm
Lead Channel Impedance Value: 931 Ohm
Lead Channel Pacing Threshold Amplitude: 0.75 V
Lead Channel Pacing Threshold Amplitude: 0.75 V
Lead Channel Pacing Threshold Amplitude: 1 V
Lead Channel Pacing Threshold Pulse Width: 0.4 ms
Lead Channel Pacing Threshold Pulse Width: 0.4 ms
Lead Channel Pacing Threshold Pulse Width: 0.4 ms
Lead Channel Sensing Intrinsic Amplitude: 3.375 mV
Lead Channel Sensing Intrinsic Amplitude: 31.625 mV
Lead Channel Setting Pacing Amplitude: 1.75 V
Lead Channel Setting Pacing Amplitude: 2.5 V
Lead Channel Setting Pacing Pulse Width: 0.4 ms
Lead Channel Setting Sensing Sensitivity: 0.9 mV
MDC IDC MSMT LEADCHNL RA SENSING INTR AMPL: 3.375 mV
MDC IDC MSMT LEADCHNL RV SENSING INTR AMPL: 31.625 mV
MDC IDC SET LEADCHNL LV PACING PULSEWIDTH: 0.4 ms
MDC IDC SET LEADCHNL RA PACING AMPLITUDE: 2 V
MDC IDC SET ZONE DETECTION INTERVAL: 350 ms
MDC IDC SET ZONE DETECTION INTERVAL: 400 ms
MDC IDC STAT BRADY AP VS PERCENT: 0.01 %
MDC IDC STAT BRADY AS VP PERCENT: 99.9 %

## 2015-07-10 ENCOUNTER — Encounter: Payer: Self-pay | Admitting: Cardiology

## 2015-09-03 ENCOUNTER — Ambulatory Visit (INDEPENDENT_AMBULATORY_CARE_PROVIDER_SITE_OTHER): Payer: BLUE CROSS/BLUE SHIELD | Admitting: *Deleted

## 2015-09-03 DIAGNOSIS — I442 Atrioventricular block, complete: Secondary | ICD-10-CM | POA: Diagnosis not present

## 2015-09-04 NOTE — Progress Notes (Signed)
Remote pacemaker transmission.   

## 2015-09-06 ENCOUNTER — Encounter: Payer: Self-pay | Admitting: Cardiology

## 2015-09-06 LAB — CUP PACEART REMOTE DEVICE CHECK
Battery Remaining Longevity: 70 mo
Battery Voltage: 3.01 V
Brady Statistic AP VP Percent: 0.03 %
Brady Statistic AS VP Percent: 99.91 %
Brady Statistic RA Percent Paced: 0.04 %
Brady Statistic RV Percent Paced: 99.95 %
Date Time Interrogation Session: 20170103061719
Implantable Lead Implant Date: 20130812
Implantable Lead Implant Date: 20130812
Implantable Lead Location: 753860
Implantable Lead Model: 5076
Lead Channel Impedance Value: 1026 Ohm
Lead Channel Impedance Value: 304 Ohm
Lead Channel Impedance Value: 380 Ohm
Lead Channel Impedance Value: 437 Ohm
Lead Channel Impedance Value: 475 Ohm
Lead Channel Impedance Value: 646 Ohm
Lead Channel Impedance Value: 665 Ohm
Lead Channel Impedance Value: 779 Ohm
Lead Channel Pacing Threshold Amplitude: 0.625 V
Lead Channel Pacing Threshold Amplitude: 0.75 V
Lead Channel Pacing Threshold Pulse Width: 0.4 ms
Lead Channel Pacing Threshold Pulse Width: 0.4 ms
Lead Channel Sensing Intrinsic Amplitude: 31.625 mV
Lead Channel Sensing Intrinsic Amplitude: 31.625 mV
Lead Channel Setting Pacing Amplitude: 1.75 V
Lead Channel Setting Pacing Amplitude: 2 V
Lead Channel Setting Pacing Pulse Width: 0.4 ms
Lead Channel Setting Pacing Pulse Width: 0.4 ms
MDC IDC LEAD IMPLANT DT: 20130812
MDC IDC LEAD LOCATION: 753858
MDC IDC LEAD LOCATION: 753859
MDC IDC MSMT LEADCHNL LV IMPEDANCE VALUE: 513 Ohm
MDC IDC MSMT LEADCHNL LV PACING THRESHOLD PULSEWIDTH: 0.4 ms
MDC IDC MSMT LEADCHNL RA SENSING INTR AMPL: 3.25 mV
MDC IDC MSMT LEADCHNL RA SENSING INTR AMPL: 3.25 mV
MDC IDC MSMT LEADCHNL RV PACING THRESHOLD AMPLITUDE: 1.125 V
MDC IDC SET LEADCHNL RV PACING AMPLITUDE: 2.5 V
MDC IDC SET LEADCHNL RV SENSING SENSITIVITY: 0.9 mV
MDC IDC STAT BRADY AP VS PERCENT: 0.01 %
MDC IDC STAT BRADY AS VS PERCENT: 0.04 %

## 2015-09-11 ENCOUNTER — Encounter: Payer: Self-pay | Admitting: Internal Medicine

## 2015-09-17 ENCOUNTER — Other Ambulatory Visit (HOSPITAL_COMMUNITY): Payer: Self-pay | Admitting: Psychiatry

## 2015-09-26 ENCOUNTER — Other Ambulatory Visit (HOSPITAL_COMMUNITY): Payer: Self-pay | Admitting: Psychiatry

## 2015-10-14 ENCOUNTER — Other Ambulatory Visit: Payer: Self-pay | Admitting: Cardiology

## 2015-12-10 ENCOUNTER — Ambulatory Visit (INDEPENDENT_AMBULATORY_CARE_PROVIDER_SITE_OTHER): Payer: BLUE CROSS/BLUE SHIELD | Admitting: Internal Medicine

## 2015-12-10 ENCOUNTER — Encounter: Payer: Self-pay | Admitting: Internal Medicine

## 2015-12-10 VITALS — BP 132/88 | HR 69 | Ht 62.0 in | Wt 161.6 lb

## 2015-12-10 DIAGNOSIS — R55 Syncope and collapse: Secondary | ICD-10-CM | POA: Diagnosis not present

## 2015-12-10 DIAGNOSIS — I429 Cardiomyopathy, unspecified: Secondary | ICD-10-CM

## 2015-12-10 DIAGNOSIS — I447 Left bundle-branch block, unspecified: Secondary | ICD-10-CM

## 2015-12-10 DIAGNOSIS — Z950 Presence of cardiac pacemaker: Secondary | ICD-10-CM

## 2015-12-10 DIAGNOSIS — I428 Other cardiomyopathies: Secondary | ICD-10-CM

## 2015-12-10 DIAGNOSIS — I5022 Chronic systolic (congestive) heart failure: Secondary | ICD-10-CM | POA: Diagnosis not present

## 2015-12-10 NOTE — Patient Instructions (Signed)
Medication Instructions: - Your physician recommends that you continue on your current medications as directed. Please refer to the Current Medication list given to you today.  Labwork: - none  Procedures/Testing: - none  Follow-Up: - Remote monitoring is used to monitor your Pacemaker of ICD from home. This monitoring reduces the number of office visits required to check your device to one time per year. It allows [place] to keep an eye on the functioning of your device to ensure it is working properly. You are scheduled for a device check from home on 03/10/16. You may send your transmission at any time that day. If you have a wireless device, the transmission will be sent automatically. After your physician reviews your transmission, you will receive a postcard with your next transmission date.  - Your physician wants you to follow-up in: 6 months with Donnajean Lopes, PA for Dr. Caryl Comes and 1 year with Dr. Caryl Comes. You will receive a reminder letter in the mail two months in advance. If you don't receive a letter, please call our office to schedule the follow-up appointment.  Any Additional Special Instructions Will Be Listed Below (If Applicable).     If you need a refill on your cardiac medications before your next appointment, please call your pharmacy.

## 2015-12-10 NOTE — Progress Notes (Signed)
Patient Care Team: Vernie Shanks, MD as PCP - General (Family Medicine)   HPI  Nancy Drake is a 55 y.o. female Seen in followup for nonischemic cardiomyopathy with left bundle branch block and syncope. She is status post CRT P. Implantation. She is on ACE inhibitors and beta blockers. Her ejection fraction remains modestly reduced at 40-45% confirmed by echo 2/14      The patient denies chest pain, shortness of breath, nocturnal dyspnea, orthopnea or peripheral edema.  There have been no palpitations, lightheadedness or syncope.   She is now working with [employer]     Past Medical History  Diagnosis Date  . Anxiety     lost a child , sees psych  . Cardiomyopathy nonischemic     dx after a syncope 10-2010  . Complete heart block ([place]) 03-2012  . Biventricular cardiac pacemaker -Medtronic     DOI 2013  . Thyroid disease     used to see Dr Debbora Presto  . Mole of skin     bx 05-2013    Past Surgical History  Procedure Laterality Date  . Cesarean section    . Pacemaker insertion  03-2012  . Colonoscopy      2010  . Temporary pacemaker insertion Right 04/08/2012    Procedure: TEMPORARY PACEMAKER INSERTION;  Surgeon: Burnell Blanks, MD;  Location: [facility] CATH LAB;  Service: Cardiovascular;  Laterality: Right;  . Bi-ventricular pacemaker insertion N/A 04/11/2012    Procedure: BI-VENTRICULAR PACEMAKER INSERTION (CRT-P);  Surgeon: Deboraha Sprang, MD;  Location: [facility] CATH LAB;  Service: Cardiovascular;  Laterality: N/A;  . Lead revision N/A 04/12/2012    Procedure: LEAD REVISION;  Surgeon: Evans Lance, MD;  Location: [facility] CATH LAB;  Service: Cardiovascular;  Laterality: N/A;    Current Outpatient Prescriptions  Medication Sig Dispense Refill  . acetaminophen (TYLENOL) 500 MG tablet Take 500 mg by mouth every 6 (six) hours as needed for mild pain or headache.     . ALPRAZolam (XANAX) 0.25 MG tablet Take 0.25 mg by mouth at bedtime as needed for anxiety.    Marland Kitchen BIOTIN  PO Take 1,000 mg by mouth daily.    Marland Kitchen CALCIUM-VITAMIN D PO Take by mouth. Pt. Unsure of dosage    . carvedilol (COREG) 25 MG tablet Take 1 tablet (25 mg total) by mouth 2 (two) times daily with a meal. 60 tablet 11  . fish oil-omega-3 fatty acids 1000 MG capsule Take 2 g by mouth daily.    Marland Kitchen lisinopril (PRINIVIL,ZESTRIL) 5 MG tablet TAKE 1 TABLET BY MOUTH TWICE DAILY 180 tablet 0  . Multiple Vitamin (MULTIVITAMIN WITH MINERALS) TABS Take 1 tablet by mouth daily.    . risperiDONE (RISPERDAL) 1 MG tablet Take 1 mg by mouth at bedtime.     No current facility-administered medications for this visit.    Allergies  Allergen Reactions  . Ambien [Zolpidem Tartrate] Other (See Comments)    Abnormal dreams  . Desloratadine     Insomnia     Review of Systems negative except from HPI and PMH  Physical Exam BP 132/88 mmHg  Pulse 69  Ht 5\' 2"  (1.575 m)  Wt 161 lb 9.6 oz (73.301 kg)  BMI 29.55 kg/m2 Well developed and well nourished in no acute distress HENT normal E scleral and icterus clear Neck Supple JVP flat; carotids brisk and full Clear to ausculation  The patient's device was interrogated.  The information was reviewed. No changes were  made in the programming.   Regular rate and rhythm, no murmurs gallops or rub Soft with active bowel sounds No clubbing cyanosis all Edema Alert and oriented, grossly normal motor and sensory function Skin Warm and Dry  ECG  P-synchronous/ AV  pacing   Assessment and  Plan  Nonischemic cardiomyopathy  CHF  Chronic systolic  ICD Medtronic The patient's device was interrogated.  The information was reviewed. No changes were made in the programming.     Hypertension    BP well controlled  Euvolemic continue current meds  Continue Guideline directed medical therapy

## 2015-12-13 LAB — CUP PACEART INCLINIC DEVICE CHECK
Battery Remaining Longevity: 62 mo
Brady Statistic AP VP Percent: 0.05 %
Brady Statistic AP VS Percent: 0.01 %
Brady Statistic AS VP Percent: 99.89 %
Brady Statistic AS VS Percent: 0.04 %
Brady Statistic RV Percent Paced: 99.95 %
Implantable Lead Implant Date: 20130812
Implantable Lead Implant Date: 20130812
Implantable Lead Implant Date: 20130812
Implantable Lead Location: 753858
Implantable Lead Location: 753859
Implantable Lead Model: 5076
Implantable Lead Model: 5076
Lead Channel Impedance Value: 304 Ohm
Lead Channel Impedance Value: 475 Ohm
Lead Channel Impedance Value: 513 Ohm
Lead Channel Impedance Value: 589 Ohm
Lead Channel Impedance Value: 646 Ohm
Lead Channel Pacing Threshold Amplitude: 0.5 V
Lead Channel Pacing Threshold Amplitude: 1 V
Lead Channel Pacing Threshold Pulse Width: 0.4 ms
Lead Channel Sensing Intrinsic Amplitude: 2.75 mV
Lead Channel Sensing Intrinsic Amplitude: 31.625 mV
Lead Channel Setting Pacing Amplitude: 1.5 V
Lead Channel Setting Pacing Amplitude: 2 V
Lead Channel Setting Pacing Pulse Width: 0.4 ms
Lead Channel Setting Pacing Pulse Width: 0.4 ms
MDC IDC LEAD LOCATION: 753860
MDC IDC MSMT BATTERY VOLTAGE: 3 V
MDC IDC MSMT LEADCHNL LV IMPEDANCE VALUE: 741 Ohm
MDC IDC MSMT LEADCHNL LV IMPEDANCE VALUE: 969 Ohm
MDC IDC MSMT LEADCHNL LV PACING THRESHOLD PULSEWIDTH: 0.4 ms
MDC IDC MSMT LEADCHNL RA IMPEDANCE VALUE: 437 Ohm
MDC IDC MSMT LEADCHNL RA PACING THRESHOLD AMPLITUDE: 0.75 V
MDC IDC MSMT LEADCHNL RV IMPEDANCE VALUE: 380 Ohm
MDC IDC MSMT LEADCHNL RV PACING THRESHOLD PULSEWIDTH: 0.4 ms
MDC IDC SESS DTM: 20170411082545
MDC IDC SET LEADCHNL RV PACING AMPLITUDE: 2.5 V
MDC IDC SET LEADCHNL RV SENSING SENSITIVITY: 0.9 mV
MDC IDC STAT BRADY RA PERCENT PACED: 0.06 %

## 2015-12-20 ENCOUNTER — Other Ambulatory Visit: Payer: Self-pay

## 2015-12-20 MED ORDER — CARVEDILOL 25 MG PO TABS: 25.0000 mg | ORAL_TABLET | Freq: Two times a day (BID) | ORAL | Status: DC

## 2016-02-07 ENCOUNTER — Other Ambulatory Visit: Payer: Self-pay | Admitting: Cardiology

## 2016-03-10 ENCOUNTER — Ambulatory Visit (INDEPENDENT_AMBULATORY_CARE_PROVIDER_SITE_OTHER): Payer: BLUE CROSS/BLUE SHIELD | Admitting: *Deleted

## 2016-03-10 DIAGNOSIS — I442 Atrioventricular block, complete: Secondary | ICD-10-CM | POA: Diagnosis not present

## 2016-03-10 NOTE — Progress Notes (Signed)
Remote pacemaker transmission.   

## 2016-03-13 ENCOUNTER — Encounter: Payer: Self-pay | Admitting: Cardiology

## 2016-03-13 LAB — CUP PACEART REMOTE DEVICE CHECK
Battery Remaining Longevity: 53 mo
Battery Voltage: 3 V
Brady Statistic AS VS Percent: 0.03 %
Implantable Lead Implant Date: 20130812
Implantable Lead Location: 753858
Implantable Lead Location: 753859
Implantable Lead Model: 5076
Lead Channel Impedance Value: 361 Ohm
Lead Channel Impedance Value: 418 Ohm
Lead Channel Impedance Value: 456 Ohm
Lead Channel Impedance Value: 494 Ohm
Lead Channel Impedance Value: 570 Ohm
Lead Channel Impedance Value: 627 Ohm
Lead Channel Impedance Value: 703 Ohm
Lead Channel Impedance Value: 931 Ohm
Lead Channel Pacing Threshold Amplitude: 0.5 V
Lead Channel Pacing Threshold Amplitude: 0.5 V
Lead Channel Pacing Threshold Pulse Width: 0.4 ms
Lead Channel Sensing Intrinsic Amplitude: 3.125 mV
Lead Channel Sensing Intrinsic Amplitude: 3.125 mV
Lead Channel Setting Pacing Amplitude: 1.5 V
Lead Channel Setting Pacing Amplitude: 2 V
Lead Channel Setting Pacing Amplitude: 3.25 V
Lead Channel Setting Pacing Pulse Width: 0.4 ms
Lead Channel Setting Pacing Pulse Width: 0.4 ms
Lead Channel Setting Sensing Sensitivity: 0.9 mV
MDC IDC LEAD IMPLANT DT: 20130812
MDC IDC LEAD IMPLANT DT: 20130812
MDC IDC LEAD LOCATION: 753860
MDC IDC MSMT LEADCHNL LV PACING THRESHOLD PULSEWIDTH: 0.4 ms
MDC IDC MSMT LEADCHNL RA IMPEDANCE VALUE: 323 Ohm
MDC IDC MSMT LEADCHNL RV PACING THRESHOLD AMPLITUDE: 1.625 V
MDC IDC MSMT LEADCHNL RV PACING THRESHOLD PULSEWIDTH: 0.4 ms
MDC IDC MSMT LEADCHNL RV SENSING INTR AMPL: 31.625 mV
MDC IDC MSMT LEADCHNL RV SENSING INTR AMPL: 31.625 mV
MDC IDC SESS DTM: 20170711022536
MDC IDC STAT BRADY AP VP PERCENT: 0.11 %
MDC IDC STAT BRADY AP VS PERCENT: 0.01 %
MDC IDC STAT BRADY AS VP PERCENT: 99.85 %
MDC IDC STAT BRADY RA PERCENT PACED: 0.11 %
MDC IDC STAT BRADY RV PERCENT PACED: 99.96 %

## 2016-04-01 ENCOUNTER — Encounter: Payer: Self-pay | Admitting: Cardiology

## 2016-04-02 ENCOUNTER — Other Ambulatory Visit (HOSPITAL_COMMUNITY): Payer: Self-pay | Admitting: Psychiatry

## 2016-04-16 ENCOUNTER — Ambulatory Visit: Payer: BLUE CROSS/BLUE SHIELD | Admitting: Cardiology

## 2016-04-20 ENCOUNTER — Encounter: Payer: Self-pay | Admitting: Cardiology

## 2016-04-21 ENCOUNTER — Ambulatory Visit (INDEPENDENT_AMBULATORY_CARE_PROVIDER_SITE_OTHER): Payer: BLUE CROSS/BLUE SHIELD | Admitting: Cardiology

## 2016-04-21 ENCOUNTER — Encounter (INDEPENDENT_AMBULATORY_CARE_PROVIDER_SITE_OTHER): Payer: Self-pay

## 2016-04-21 ENCOUNTER — Encounter: Payer: Self-pay | Admitting: Cardiology

## 2016-04-21 VITALS — BP 172/94 | HR 69 | Ht 61.0 in | Wt 145.1 lb

## 2016-04-21 DIAGNOSIS — I5022 Chronic systolic (congestive) heart failure: Secondary | ICD-10-CM | POA: Diagnosis not present

## 2016-04-21 DIAGNOSIS — I447 Left bundle-branch block, unspecified: Secondary | ICD-10-CM | POA: Diagnosis not present

## 2016-04-21 DIAGNOSIS — I110 Hypertensive heart disease with heart failure: Secondary | ICD-10-CM

## 2016-04-21 DIAGNOSIS — I428 Other cardiomyopathies: Secondary | ICD-10-CM

## 2016-04-21 DIAGNOSIS — I429 Cardiomyopathy, unspecified: Secondary | ICD-10-CM

## 2016-04-21 DIAGNOSIS — Z950 Presence of cardiac pacemaker: Secondary | ICD-10-CM | POA: Diagnosis not present

## 2016-04-21 MED ORDER — LISINOPRIL 10 MG PO TABS: 10.0000 mg | ORAL_TABLET | Freq: Two times a day (BID) | ORAL | 3 refills | Status: DC

## 2016-04-21 NOTE — Patient Instructions (Signed)
Medication Instructions:  Please increase Lisinopril to 10 mg twice a day. Continue all other medications as listed.  Follow-Up: Follow up in 6 months with Truitt Merle, NP.  You will receive a letter in the mail 2 months before you are due.  Please call us when you receive this letter to schedule your follow up appointment.  If you need a refill on your cardiac medications before your next appointment, please call your pharmacy.  Thank you for choosing [facility]!!

## 2016-04-21 NOTE — Progress Notes (Signed)
Cardiology Office Note    Date:  04/21/2016   ID:  Nancy Drake, DOB 1961-04-15, MRN [id number]  PCP:  Anthoney Harada, MD  Cardiologist:   Candee Furbish, MD Dr. Lynelle Doctor EP, former Dr. Aundra Dubin    History of Present Illness:  Nancy Drake is a 55 y.o. female here for evaluation of nonischemic cardiopathy at the request of Dr. Caryl Comes, left bundle branch block, prior syncope status post CRT implantation. Has been tolerant of both ACE inhibitor as well as beta blockers. Ejection fraction only mildly reduced at 40-45%-Echo 2014. Her cardio myopathy was diagnosed after an episode of syncope in February 2012.  No chest pain, no shortness of breath, no edema, no palpitations, no syncope  Biventricular cardiac pacemaker Medtronic. She had a lead revision and 2013 by Dr. Lovena Le.   In 1/12 while walking on a treadmill, patient felt lightheaded and flushed and passed out.  She landed on her face.  She was unconscious only for a few seconds she thinks.  She has had no syncopal or presyncopal episodes prior or since then.  As part of the workup for her syncopal episode, she had an echocardiogram.  This showed diffuse global LV hypokinesis.  EF was reported as 35-40%.  It looked more like 30% at the most to me.  As mentioned above, she has no history of chest pain.  No recent URI-type symptoms.  No history of HTN.  No family history of cardiomyopathy. She had a LBBB on ECG.  This had been seen in the past as well.   Left heart cath in 3/12 showed EF improved somewhat to 45-50% and no angiographic coronary disease.  Cardiac MRI showed EF 43% with no delayed enhancement.  3 week event monitor showed no significant arrhythmias.   Patient continues to do well symptomatically.   No exertional dyspnea or chest pain.  No further syncope.  In 6/13, she went to the ER for a panic attack and ended up with a 3 day stay at [facility].    She mentioned several things about stressors in her life,  her father. She mentioned having children at 3 AM in the morning. She mentioned how she does not much sleep usually. She's had some difficulty recently with labile blood pressures.    Past Medical History:  Diagnosis Date  . Anxiety    lost a child , sees psych  . Biventricular cardiac pacemaker -Medtronic    DOI 2013  . Cardiomyopathy nonischemic    dx after a syncope 10-2010  . Complete heart block ([place]) 03-2012  . Mole of skin    bx 05-2013  . Thyroid disease    used to see Dr Debbora Presto    Past Surgical History:  Procedure Laterality Date  . BI-VENTRICULAR PACEMAKER INSERTION N/A 04/11/2012   Procedure: BI-VENTRICULAR PACEMAKER INSERTION (CRT-P);  Surgeon: Deboraha Sprang, MD;  Location: [facility] CATH LAB;  Service: Cardiovascular;  Laterality: N/A;  . CESAREAN SECTION    . COLONOSCOPY     2010  . LEAD REVISION N/A 04/12/2012   Procedure: LEAD REVISION;  Surgeon: Evans Lance, MD;  Location: [facility] CATH LAB;  Service: Cardiovascular;  Laterality: N/A;  . PACEMAKER INSERTION  03-2012  . TEMPORARY PACEMAKER INSERTION Right 04/08/2012   Procedure: TEMPORARY PACEMAKER INSERTION;  Surgeon: Burnell Blanks, MD;  Location: [facility] CATH LAB;  Service: Cardiovascular;  Laterality: Right;    Current Medications: Outpatient Medications Prior to Visit  Medication Sig Dispense Refill  . acetaminophen (TYLENOL)  500 MG tablet Take 500 mg by mouth every 6 (six) hours as needed for mild pain or headache.     . ALPRAZolam (XANAX) 0.25 MG tablet Take 0.25 mg by mouth at bedtime as needed for anxiety.    . carvedilol (COREG) 25 MG tablet Take 1 tablet (25 mg total) by mouth 2 (two) times daily with a meal. 60 tablet 5  . risperiDONE (RISPERDAL) 1 MG tablet Take 1 mg by mouth at bedtime.    Marland Kitchen lisinopril (PRINIVIL,ZESTRIL) 5 MG tablet TAKE 1 TABLET BY MOUTH TWICE DAILY 180 tablet 2  . BIOTIN PO Take 1,000 mg by mouth daily.    Marland Kitchen CALCIUM-VITAMIN D PO Take by mouth. Pt. Unsure of dosage    . fish oil-omega-3  fatty acids 1000 MG capsule Take 2 g by mouth daily.    . Multiple Vitamin (MULTIVITAMIN WITH MINERALS) TABS Take 1 tablet by mouth daily.     No facility-administered medications prior to visit.      Allergies:   Ambien [zolpidem tartrate] and Desloratadine   Social History   Social History  . Marital status: Divorced    Spouse name: N/A  . Number of children: 2  . Years of education: N/A   Occupational History  . not working at present [place] Topics  . Smoking status: Light Tobacco Smoker    Types: Cigars  . Smokeless tobacco: Never Used  . Alcohol use 0.0 oz/week     Comment: socially   . Drug use: No  . Sexual activity: Yes    Birth control/ protection: Post-menopausal   Other Topics Concern  . None   Social History Narrative   Born in [place], lives w/ father    2 child, lost one      Family History:  The patient's family history includes Breast cancer in her other; Cancer in her father; Diabetes in her mother; Heart disease in her other; Other in her other.   ROS:   Please see the history of present illness.    ROS All other systems reviewed and are negative.   PHYSICAL EXAM:   VS:  BP (!) 172/94 (BP Location: Right Arm)   Pulse 69   Ht 5\' 1"  (1.549 m)   Wt 145 lb 1.9 oz (65.8 kg)   SpO2 98%   BMI 27.42 kg/m    GEN: Well nourished, well developed, in no acute distress  HEENT: normal  Neck: no JVD, carotid bruits, or masses Cardiac: RRR; no murmurs, rubs, or gallops,no edema  Respiratory:  clear to auscultation bilaterally, normal work of breathing GI: soft, nontender, nondistended, + BS MS: no deformity or atrophy  Skin: warm and dry, no rash Neuro:  Alert and Oriented x 3, Strength and sensation are intact Psych: euthymic mood, full affect, Tangential  Wt Readings from Last 3 Encounters:  04/21/16 145 lb 1.9 oz (65.8 kg)  12/10/15 161 lb 9.6 oz (73.3 kg)  11/29/14 146 lb 12.8 oz (66.6 kg)      Studies/Labs Reviewed:    EKG:  EKG is not ordered today.    Recent Labs: No results found for requested labs within last 8760 hours.   Lipid Panel    Component Value Date/Time   CHOL 195 06/29/2013 0933   TRIG 62.0 06/29/2013 0933   HDL 59.20 06/29/2013 0933   CHOLHDL 3 06/29/2013 0933   VLDL 12.4 06/29/2013 0933   LDLCALC 123 (H) 06/29/2013 0933    Additional studies/  records that were reviewed today include:   ECHO 10/12/12:  - Left ventricle: Worse in the inferior base The cavity size was moderately dilated. Wall thickness was normal. Systolic function was mildly to moderately reduced. The estimated ejection fraction was in the range of 40% to 45%. Diffuse hypokinesis. - Mitral valve: Mild regurgitation. - Atrial septum: No defect or patent foramen ovale was identified.   ASSESSMENT:    1. Nonischemic cardiomyopathy ([place])   2. Chronic systolic heart failure ([place])   3. Biventricular cardiac pacemaker in situ   4. LBBB (left bundle branch block)   5. Hypertensive heart disease with heart failure (HCC)      PLAN:  In order of problems listed above:  Nonischemic cardiomyopathy/chronic systolic heart failure  - Appears to be euvolemic. Doing well. No changes in medications. Continue with beta blocker and ACE inhibitor (increasing). Prior EF 40-45%. Overall seems to be doing fairly well with regards to her cardiomyopathy.  - Her weight is down approximate 20 pounds. She enjoys cycling.  Medtronic biventricular pacemaker  - Doing well. Dr. Caryl Comes will continue to follow.  Hypertension with heart failure  - Her blood pressure is elevated today. Some of this is secondary to related stress. I will increase her lisinopril from 5 mg twice a day up to 10 mg twice a day. She is going to be seeing Dr. Jacelyn Grip her primary physician next month. Next suggestion if she continues to have high blood pressures is to add spironolactone.  Anxiety  - Tries not to take too much Xanax. She takes  Risperdal. Per primary team.   Medication Adjustments/Labs and Tests Ordered: Current medicines are reviewed at length with the patient today.  Concerns regarding medicines are outlined above.  Medication changes, Labs and Tests ordered today are listed in the Patient Instructions below. Patient Instructions  Medication Instructions:  Please increase Lisinopril to 10 mg twice a day. Continue all other medications as listed.  Follow-Up: Follow up in 6 months with Truitt Merle, NP.  You will receive a letter in the mail 2 months before you are due.  Please call us when you receive this letter to schedule your follow up appointment.  If you need a refill on your cardiac medications before your next appointment, please call your pharmacy.  Thank you for choosing [facility]!!        Signed, Candee Furbish, MD  04/21/2016 10:47 AM    [facility] [address], [place], [place]  [address] Phone: [phone]; Fax: [phone]

## 2016-05-01 ENCOUNTER — Encounter: Payer: Self-pay | Admitting: *Deleted

## 2016-05-15 ENCOUNTER — Ambulatory Visit: Payer: BLUE CROSS/BLUE SHIELD | Admitting: Cardiology

## 2016-05-20 ENCOUNTER — Encounter: Payer: Self-pay | Admitting: *Deleted

## 2016-07-12 ENCOUNTER — Other Ambulatory Visit: Payer: Self-pay | Admitting: Internal Medicine

## 2016-09-01 ENCOUNTER — Telehealth: Payer: Self-pay | Admitting: Internal Medicine

## 2016-09-01 NOTE — Telephone Encounter (Signed)
Pt wants to know when she is due to get her pacemaker checked, I told her she was due in Nov with Renee but that appt was not made, did she want to go ahead and make that? She then wanted to know the last device check, the last one I saw was Marcellina Millin said she sent one since then, I told her she may have and I can have device clinic check-I can only see scheduled ones. She has a Feb 2-20 appt with Cecille Rubin that we kept cancelling and wants the nurse to call her about what that appt is for-I said 6 month fu -but she wanted more info-said we needed to get out act together-message to device for last transmission and nurse for appt with Lori-pls call [phone]-although she asked me to have everyone in the office call her

## 2016-09-01 NOTE — Telephone Encounter (Signed)
Returned patient call regarding last remote check. I explained that her last check was on 7/10. She was sent a recall to see RU 07/2016 so she had not had any additional home monitor checks scheduled. She never scheduled her RU appt so she will send a remote today or tomorrow and then follow up with SK 11/2016. Patient verbalized understanding. I additionally explained that her follow up appointment with Truitt Merle was at the request of Dr. Marlou Porch for follow up after her changes in lisinopril. Patient again verbalized understanding and was appreciative for the call.

## 2016-09-03 ENCOUNTER — Telehealth: Payer: Self-pay | Admitting: Cardiology

## 2016-09-03 ENCOUNTER — Ambulatory Visit: Payer: BLUE CROSS/BLUE SHIELD | Admitting: *Deleted

## 2016-09-03 NOTE — Telephone Encounter (Signed)
LMOVM reminding pt to send remote transmission.   

## 2016-09-04 ENCOUNTER — Encounter: Payer: Self-pay | Admitting: Cardiology

## 2016-09-08 ENCOUNTER — Ambulatory Visit (INDEPENDENT_AMBULATORY_CARE_PROVIDER_SITE_OTHER): Payer: BLUE CROSS/BLUE SHIELD | Admitting: *Deleted

## 2016-09-08 DIAGNOSIS — I442 Atrioventricular block, complete: Secondary | ICD-10-CM

## 2016-09-28 ENCOUNTER — Telehealth: Payer: Self-pay | Admitting: Cardiology

## 2016-09-28 NOTE — Telephone Encounter (Signed)
LMOM to disregard letter- transmission was received. She can call back to schedule f/u with Dr. Caryl Comes in April.

## 2016-09-28 NOTE — Telephone Encounter (Signed)
New message   1. Has your device fired? no  2. Is you device beeping? no  3. Are you experiencing draining or swelling at device site? no  4. Are you calling to see if we received your device transmission? no  5. Have you passed out? No  Pt call requesting to speak with RN about a letter she received stating the device clinic has not received. Please call back to discuss

## 2016-09-29 ENCOUNTER — Encounter: Payer: Self-pay | Admitting: Cardiology

## 2016-09-29 NOTE — Progress Notes (Signed)
Remote pacemaker transmission.   

## 2016-10-01 LAB — CUP PACEART REMOTE DEVICE CHECK
Battery Voltage: 3 V
Brady Statistic AP VP Percent: 0.06 %
Brady Statistic AP VS Percent: 0.01 %
Brady Statistic AS VP Percent: 99.92 %
Brady Statistic AS VS Percent: 0.01 %
Brady Statistic RA Percent Paced: 0.06 %
Brady Statistic RV Percent Paced: 99.98 %
Date Time Interrogation Session: 20180109082347
Implantable Lead Implant Date: 20130812
Implantable Lead Location: 753858
Implantable Lead Location: 753859
Implantable Lead Location: 753860
Implantable Lead Model: 5076
Lead Channel Impedance Value: 304 Ohm
Lead Channel Impedance Value: 456 Ohm
Lead Channel Impedance Value: 513 Ohm
Lead Channel Impedance Value: 551 Ohm
Lead Channel Impedance Value: 646 Ohm
Lead Channel Impedance Value: 703 Ohm
Lead Channel Impedance Value: 931 Ohm
Lead Channel Pacing Threshold Amplitude: 0.375 V
Lead Channel Pacing Threshold Pulse Width: 0.4 ms
Lead Channel Pacing Threshold Pulse Width: 0.4 ms
Lead Channel Sensing Intrinsic Amplitude: 2.75 mV
Lead Channel Sensing Intrinsic Amplitude: 31.625 mV
Lead Channel Sensing Intrinsic Amplitude: 31.625 mV
Lead Channel Setting Pacing Amplitude: 1.5 V
Lead Channel Setting Pacing Amplitude: 2 V
Lead Channel Setting Pacing Amplitude: 2.5 V
Lead Channel Setting Pacing Pulse Width: 0.4 ms
Lead Channel Setting Pacing Pulse Width: 0.4 ms
Lead Channel Setting Sensing Sensitivity: 0.9 mV
MDC IDC LEAD IMPLANT DT: 20130812
MDC IDC LEAD IMPLANT DT: 20130812
MDC IDC MSMT BATTERY REMAINING LONGEVITY: 60 mo
MDC IDC MSMT LEADCHNL LV PACING THRESHOLD AMPLITUDE: 0.5 V
MDC IDC MSMT LEADCHNL RA IMPEDANCE VALUE: 380 Ohm
MDC IDC MSMT LEADCHNL RA SENSING INTR AMPL: 2.75 mV
MDC IDC MSMT LEADCHNL RV IMPEDANCE VALUE: 361 Ohm
MDC IDC MSMT LEADCHNL RV PACING THRESHOLD AMPLITUDE: 1.125 V
MDC IDC MSMT LEADCHNL RV PACING THRESHOLD PULSEWIDTH: 0.4 ms
MDC IDC PG IMPLANT DT: 20130812

## 2016-10-20 ENCOUNTER — Other Ambulatory Visit: Payer: Self-pay | Admitting: *Deleted

## 2016-10-20 ENCOUNTER — Encounter: Payer: Self-pay | Admitting: Nurse Practitioner

## 2016-10-20 ENCOUNTER — Ambulatory Visit (INDEPENDENT_AMBULATORY_CARE_PROVIDER_SITE_OTHER): Payer: BLUE CROSS/BLUE SHIELD | Admitting: Nurse Practitioner

## 2016-10-20 VITALS — BP 150/100 | HR 88 | Ht 62.0 in | Wt 146.8 lb

## 2016-10-20 DIAGNOSIS — I428 Other cardiomyopathies: Secondary | ICD-10-CM | POA: Diagnosis not present

## 2016-10-20 MED ORDER — RISPERIDONE 0.5 MG PO TABS: 0.5000 mg | ORAL_TABLET | Freq: Every day | ORAL | 9 refills | Status: DC

## 2016-10-20 MED ORDER — LISINOPRIL 5 MG PO TABS: 10.0000 mg | ORAL_TABLET | Freq: Two times a day (BID) | ORAL | 3 refills | Status: DC

## 2016-10-20 NOTE — Progress Notes (Signed)
CARDIOLOGY OFFICE NOTE  Date:  10/20/2016    [name] Date of Birth: [birth year] Medical Record [id number]  PCP:  Anthoney Harada, MD  Cardiologist:  [facility]  Chief Complaint  Patient presents with  . Cardiomyopathy    Follow up visit - seen for Dr. Marlou Porch    History of Present Illness: Nancy Drake is a 56 y.o. female who presents today for a follow up visit. Seen for Dr. Hampton Abbot.   She has a history of NICM, LBBB, prior syncope status post CRT implantation. Has been tolerant of both ACE inhibitor as well as beta blockers. Ejection fraction only mildly reduced at 40-45% with last Echo 2014. Her cardiomyopathy was diagnosed after an episode of syncope in February 2012.  Left heart cath in 3/12 showed EF improved somewhat to 45-50% and no angiographic coronary disease. Cardiac MRI showed EF 43% with no delayed enhancement. 3 week event monitor showed no significant arrhythmias. Biventricular cardiac pacemaker Medtronic. She had a lead revision in 2013 by Dr. Lovena Le.  In 6/13, she went to the ER for a panic attack and ended up with a 3 day stay at [facility].   Last seen back in August of 2017 by Dr. Marlou Porch - cardiac status seemed stable. Her ACE was increased at that time.   Comes in today. Here alone. History pretty hard to extract and focus in on. BP is high here today - tells me she just took a Xanax before coming in. Does not check at home. Immediately begins the visit with telling me how everyone here "has not done there job" and "has made so many mistakes" back apparently when she was first diagnosed. Talks about how it has taken years to get her diagnosis of schizophrenia removed from her history.  She does not have chest pain. She says she is always short of breath. She tries to swim, bike regularly. No syncope. Needs EP visit in April. Using 5 mg tablets of Lisinopril because "she knows that color" and does not wish to have her RX  changed. Seems to watch her salt. No recent echo. She is on less Risperdal as well. She brings me a letter from psyche that she would like scanned into her record noting that she does not carry the diagnosis of schizophrenia.    Past Medical History:  Diagnosis Date  . Anxiety    lost a child , sees psych  . Biventricular cardiac pacemaker -Medtronic    DOI 2013  . Cardiomyopathy nonischemic    dx after a syncope 10-2010  . Complete heart block ([place]) 03-2012  . Mole of skin    bx 05-2013  . Thyroid disease    used to see Dr Debbora Presto    Past Surgical History:  Procedure Laterality Date  . BI-VENTRICULAR PACEMAKER INSERTION N/A 04/11/2012   Procedure: BI-VENTRICULAR PACEMAKER INSERTION (CRT-P);  Surgeon: Deboraha Sprang, MD;  Location: [facility] CATH LAB;  Service: Cardiovascular;  Laterality: N/A;  . CESAREAN SECTION    . COLONOSCOPY     2010  . LEAD REVISION N/A 04/12/2012   Procedure: LEAD REVISION;  Surgeon: Evans Lance, MD;  Location: [facility] CATH LAB;  Service: Cardiovascular;  Laterality: N/A;  . PACEMAKER INSERTION  03-2012  . TEMPORARY PACEMAKER INSERTION Right 04/08/2012   Procedure: TEMPORARY PACEMAKER INSERTION;  Surgeon: Burnell Blanks, MD;  Location: [facility] CATH LAB;  Service: Cardiovascular;  Laterality: Right;     Medications: Current Outpatient Prescriptions  Medication Sig Dispense Refill  .  ALPRAZolam (XANAX) 0.25 MG tablet Take 0.25 mg by mouth at bedtime as needed for anxiety.    . carvedilol (COREG) 25 MG tablet TAKE 1 TABLET(25 MG) BY MOUTH TWICE DAILY WITH A MEAL 60 tablet 9  . lisinopril (PRINIVIL,ZESTRIL) 10 MG tablet Take 1 tablet (10 mg total) by mouth 2 (two) times daily. 180 tablet 3  . lisinopril (PRINIVIL,ZESTRIL) 5 MG tablet Take 2 tablets (10 mg total) by mouth 2 (two) times daily. 120 tablet 3  . risperiDONE (RISPERDAL) 0.5 MG tablet Take 1 tablet (0.5 mg total) by mouth at bedtime. 30 tablet 9  . risperiDONE (RISPERDAL) 1 MG tablet Take 1 tablet (1 mg  total) by mouth daily. 30 tablet 0   No current facility-administered medications for this visit.     Allergies: Allergies  Allergen Reactions  . Ambien [Zolpidem Tartrate] Other (See Comments)    Abnormal dreams  . Desloratadine     Insomnia     Social History: The patient  reports that she has never smoked. She has never used smokeless tobacco. She reports that she drinks alcohol. She reports that she does not use drugs.   Family History: The patient's family history includes Breast cancer in her other; Cancer in her father; Diabetes in her mother; Heart disease in her other; Other in her other.   Review of Systems: Please see the history of present illness.   Otherwise, the review of systems is positive for none.   All other systems are reviewed and negative.   Physical Exam: VS:  BP (!) 150/100   Pulse 88   Ht 5\' 2"  (1.575 m)   Wt 146 lb 12.8 oz (66.6 kg)   SpO2 98% Comment: at rest  BMI 26.85 kg/m  .  BMI Body mass index is 26.85 kg/m.  Wt Readings from Last 3 Encounters:  10/20/16 146 lb 12.8 oz (66.6 kg)  04/21/16 145 lb 1.9 oz (65.8 kg)  12/10/15 161 lb 9.6 oz (73.3 kg)    BP is 150/70 in the right arm and 170/110 in the left arm.   General: Alert and in no acute distress.   HEENT: Normal.  Neck: Supple, no JVD, carotid bruits, or masses noted.  Cardiac: Regular rate and rhythm. No murmurs, rubs, or gallops. No edema.  Respiratory:  Lungs are clear to auscultation bilaterally with normal work of breathing.  GI: Soft and nontender.  MS: No deformity or atrophy. Gait and ROM intact.  Skin: Warm and dry. Color is normal.  Neuro:  Strength and sensation are intact and no gross focal deficits noted.  Psych: Alert, appropriate and with normal affect.   LABORATORY DATA:  EKG:  EKG is not ordered today.  Lab Results  Component Value Date   WBC 6.3 06/29/2013   HGB 12.4 06/29/2013   HCT 36.3 06/29/2013   PLT 261.0 06/29/2013   GLUCOSE 89 06/29/2013    CHOL 195 06/29/2013   TRIG 62.0 06/29/2013   HDL 59.20 06/29/2013   LDLCALC 123 (H) 06/29/2013   ALT 21 07/26/2013   AST 18 07/26/2013   NA 141 06/29/2013   K 3.9 06/29/2013   CL 105 06/29/2013   CREATININE 0.7 06/29/2013   BUN 15 06/29/2013   CO2 31 06/29/2013   TSH 3.50 06/29/2013   INR 1.02 04/08/2012   HGBA1C 5.9 06/29/2013    BNP (last 3 results) No results for input(s): BNP in the last 8760 hours.  ProBNP (last 3 results) No results for input(s): PROBNP  in the last 8760 hours.   Other Studies Reviewed Today:  ECHO 10/12/12:  - Left ventricle: Worse in the inferior base The cavity size was moderately dilated. Wall thickness was normal. Systolic function was mildly to moderately reduced. The estimated ejection fraction was in the range of 40% to 45%. Diffuse hypokinesis. - Mitral valve: Mild regurgitation. - Atrial septum: No defect or patent foramen ovale was identified.  Assessment/Plan:  Nonischemic cardiomyopathy/chronic systolic heart failure  -  Prior EF 40-45%. She notes that she "always has shortness of breath". Looks to be euvolemic. Not clear if her BP is actually controlled. Would get her echo updated. She did not wish to change her medicines today. Her labs are checked by PCP.   Medtronic biventricular pacemaker  - needs to see Dr. Caryl Comes in April  Hypertension with heart failure  - Her blood pressure is elevated today. Not clear to me if she is at goal or not. She does not check at home. Explained the importance of checking at home - especially if she feels she has more of a white coat syndrome. Will get the echo updated. I suspect she needs med titration.   Anxiety  - Per psyche.   Not clear to me about her level of confidence in this practice from what she voiced to me today. She is agreeable to getting echo. Further disposition to follow.   Current medicines are reviewed with the patient today.  The patient does not have concerns  regarding medicines other than what has been noted above.  The following changes have been made:  See above.  Labs/ tests ordered today include:    Orders Placed This Encounter  Procedures  . ECHOCARDIOGRAM COMPLETE     Disposition:   FU with Dr. Caryl Comes in April.   Patient is agreeable to this plan and will call if any problems develop in the interim.   SignedTruitt Merle, NP  10/20/2016 2:18 PM  [facility] [address] [place], [place]  [address] Phone: [phone] Fax: [phone]

## 2016-10-20 NOTE — Patient Instructions (Addendum)
We will be checking the following labs today - NONE  We will call Dr. Jacelyn Grip and request your recent labs   Medication Instructions:    Continue with your current medicines.     Testing/Procedures To Be Arranged:  N/A  Follow-Up:   See Dr. Caryl Comes in April  See Dr. Marlou Porch in August    Other Special Instructions:   Would monitor your BP at home and keep a diary.     If you need a refill on your cardiac medications before your next appointment, please call your pharmacy.   Call the [place] office at [phone] if you have any questions, problems or concerns.

## 2016-10-21 ENCOUNTER — Telehealth: Payer: Self-pay | Admitting: *Deleted

## 2016-10-21 NOTE — Telephone Encounter (Signed)
S/w [facility] at [phone] will fax over recent labs.

## 2016-10-23 ENCOUNTER — Ambulatory Visit: Payer: BLUE CROSS/BLUE SHIELD | Admitting: Nurse Practitioner

## 2016-10-23 ENCOUNTER — Ambulatory Visit (HOSPITAL_COMMUNITY): Payer: BLUE CROSS/BLUE SHIELD | Attending: Cardiology

## 2016-10-23 ENCOUNTER — Other Ambulatory Visit: Payer: Self-pay

## 2016-10-23 DIAGNOSIS — I5189 Other ill-defined heart diseases: Secondary | ICD-10-CM | POA: Diagnosis not present

## 2016-10-23 DIAGNOSIS — I428 Other cardiomyopathies: Secondary | ICD-10-CM

## 2016-10-23 DIAGNOSIS — I313 Pericardial effusion (noninflammatory): Secondary | ICD-10-CM | POA: Diagnosis not present

## 2016-10-27 ENCOUNTER — Telehealth: Payer: Self-pay | Admitting: Nurse Practitioner

## 2016-10-27 NOTE — Telephone Encounter (Signed)
Follow Up:     Returning your call, concerning her results. 

## 2016-10-27 NOTE — Telephone Encounter (Signed)
Follow Up:    Returning your call from yesterday,concerning her echo results.

## 2016-10-27 NOTE — Telephone Encounter (Signed)
F/u message ° °Pt returning RN call about echo results. Please call back to discuss  °

## 2016-10-30 ENCOUNTER — Ambulatory Visit: Payer: BLUE CROSS/BLUE SHIELD | Admitting: Nurse Practitioner

## 2016-11-04 ENCOUNTER — Ambulatory Visit: Payer: BLUE CROSS/BLUE SHIELD | Admitting: Nurse Practitioner

## 2016-12-10 ENCOUNTER — Encounter: Payer: BLUE CROSS/BLUE SHIELD | Admitting: Internal Medicine

## 2016-12-14 ENCOUNTER — Encounter: Payer: BLUE CROSS/BLUE SHIELD | Admitting: Internal Medicine

## 2016-12-18 ENCOUNTER — Encounter: Payer: BLUE CROSS/BLUE SHIELD | Admitting: Internal Medicine

## 2016-12-24 NOTE — Progress Notes (Signed)
Cardiology Office Note Date:  12/25/2016  Patient ID:  Nancy Drake, Nancy Drake 21-Jan-1961, MRN [id number] PCP:  Vernie Shanks, MD  Cardiologist:  Dr. Marlou Porch Electrophysiologist: Dr. Caryl Comes    Chief Complaint: annual device visit  History of Present Illness: Nancy Drake is a 56 y.o. female with history of syncope, NICM, LBBB w/CRT-P, and anxiety (6/13, she went to the ER for a panic attack and ended up with a 3 day stay at [facility]).  She comes in today to be seen for dr. Caryl Comes, last seen by him in April 2017, at that time doing well.   She was last seen by cardiology service, L. Servando Snare, NP planned for an echo and monitoring of her BP with higher reading then,   She is doing well physically, though relays significant personal stressors, is looking forward to a trip coming up.  She feels like sometimes her BP is high with a feeling of "hot flash", but has not checked her BP during those times.  She rides her bike and swims regularly for exercise and stress management, says exercise is a great outlet for her and feels her best when physically active.  She is not having any kind of CP, palpitations or exertional intolerances, no near syncope or syncope.    Device information: MDT CRT-P, implanted 04/11/12, Dr. Lovena Le, follows with Dr. Caryl Comes   Past Medical History:  Diagnosis Date  . Anxiety    lost a child , sees psych  . Biventricular cardiac pacemaker -Medtronic    DOI 2013  . Cardiomyopathy nonischemic    dx after a syncope 10-2010  . Complete heart block ([place]) 03-2012  . Mole of skin    bx 05-2013  . Thyroid disease    used to see Dr Debbora Presto    Past Surgical History:  Procedure Laterality Date  . BI-VENTRICULAR PACEMAKER INSERTION N/A 04/11/2012   Procedure: BI-VENTRICULAR PACEMAKER INSERTION (CRT-P);  Surgeon: Deboraha Sprang, MD;  Location: [facility] CATH LAB;  Service: Cardiovascular;  Laterality: N/A;  . CESAREAN SECTION    . COLONOSCOPY     2010  . LEAD REVISION  N/A 04/12/2012   Procedure: LEAD REVISION;  Surgeon: Evans Lance, MD;  Location: [facility] CATH LAB;  Service: Cardiovascular;  Laterality: N/A;  . PACEMAKER INSERTION  03-2012  . TEMPORARY PACEMAKER INSERTION Right 04/08/2012   Procedure: TEMPORARY PACEMAKER INSERTION;  Surgeon: Burnell Blanks, MD;  Location[facility] LAB;  Service: Cardiovascular;  Laterality: Right;    Current Outpatient Prescriptions  Medication Sig Dispense Refill  . ALPRAZolam (XANAX) 0.25 MG tablet Take 0.25 mg by mouth at bedtime as needed for anxiety.    . carvedilol (COREG) 25 MG tablet TAKE 1 TABLET(25 MG) BY MOUTH TWICE DAILY WITH A MEAL 60 tablet 9  . lisinopril (PRINIVIL,ZESTRIL) 5 MG tablet Take 2 tablets (10 mg total) by mouth 2 (two) times daily. 120 tablet 3  . risperiDONE (RISPERDAL) 0.5 MG tablet Take 1 tablet (0.5 mg total) by mouth at bedtime. 30 tablet 9   No current facility-administered medications for this visit.     Allergies:   Ambien [zolpidem tartrate] and Desloratadine   Social History:  The patient  reports that she has never smoked. She has never used smokeless tobacco. She reports that she drinks alcohol. She reports that she does not use drugs.   Family History:  The patient's family history includes Breast cancer in her other; Cancer in her father; Diabetes in her mother; Heart disease  in her other; Other in her other.  ROS:  Please see the history of present illness.    All other systems are reviewed and otherwise negative.   PHYSICAL EXAM:  VS:  BP 122/78   Pulse 68   Ht 5\' 1"  (1.549 m)   Wt 143 lb (64.9 kg)   BMI 27.02 kg/m  BMI: Body mass index is 27.02 kg/m. Well nourished, well developed, in no acute distress  HEENT: normocephalic, atraumatic  Neck: no JVD, carotid bruits or masses Cardiac:  RRR; no significant murmurs, no rubs, or gallops Lungs:  CTA b/l, no wheezing, rhonchi or rales  Abd: soft, nontender MS: no deformity or atrophy Ext:  no edema  Skin: warm and  dry, no rash Neuro:  No gross deficits appreciated Psych: euthymic mood, full affect  ICD site is stable, no tethering or discomfort   EKG:  Done today shows SR, V paced, unchanged from previous PPM interrogation done today by industry and reviewed by myself: battery and lead testing are good, no A or V observations, 99.9% BiVe pacing  10/23/16: TEE Study Conclusions - Left ventricle: The cavity size was normal. Wall thickness was   normal. Systolic function was mildly reduced. The estimated   ejection fraction was in the range of 45% to 50%. Diffuse   hypokinesis. Doppler parameters are consistent with abnormal left   ventricular relaxation (grade 1 diastolic dysfunction). - Mitral valve: There was mild regurgitation. - Left atrium: The atrium was mildly dilated. - Pericardium, extracardiac: A trivial pericardial effusion was   identified. Impressions: - Mild global reduction in LV function; grade 1 diastolic   dysfunction; mild MR; mild LAE; mild TR.  Recent Labs: No results found for requested labs within last 8760 hours.  No results found for requested labs within last 8760 hours.   CrCl cannot be calculated (Patient's most recent lab result is older than the maximum 21 days allowed.).   Wt Readings from Last 3 Encounters:  12/25/16 143 lb (64.9 kg)  10/20/16 146 lb 12.8 oz (66.6 kg)  04/21/16 145 lb 1.9 oz (65.8 kg)     Other studies reviewed: Additional studies/records reviewed today include: summarized above  ASSESSMENT AND PLAN:  1. CRT-P     stable device function, no changes made     On BB/ACE  2. HTN     Looks good here today  3. NICM     Exam and optivol do not suggest fluid OL, no symptoms of CHF  She appears to be doing very well from cardiac standpoint, she reports seeing her PMD last week for physical and labs.   Disposition: Will schedule 3 month remote device check, and Dr. Caryl Comes in 6 months given some prior concerns noted by L. Servando Snare of  patient concerns of historically not getting adequate care by prior providers, I think she will will feel more comfortable with closer follow, we can see her sooner if needed  Current medicines are reviewed at length with the patient today.  The patient did not have any concerns regarding medicines.  Haywood Lasso, PA-C 12/25/2016 8:54 AM     [address] [place] [address] [phone] (office)  [phone] (fax)

## 2016-12-25 ENCOUNTER — Encounter (INDEPENDENT_AMBULATORY_CARE_PROVIDER_SITE_OTHER): Payer: Self-pay

## 2016-12-25 ENCOUNTER — Ambulatory Visit (INDEPENDENT_AMBULATORY_CARE_PROVIDER_SITE_OTHER): Payer: BLUE CROSS/BLUE SHIELD | Admitting: Physician Assistant

## 2016-12-25 VITALS — BP 122/78 | HR 68 | Ht 61.0 in | Wt 143.0 lb

## 2016-12-25 DIAGNOSIS — I10 Essential (primary) hypertension: Secondary | ICD-10-CM

## 2016-12-25 DIAGNOSIS — I428 Other cardiomyopathies: Secondary | ICD-10-CM

## 2016-12-25 DIAGNOSIS — Z950 Presence of cardiac pacemaker: Secondary | ICD-10-CM

## 2016-12-25 NOTE — Patient Instructions (Addendum)
Medication Instructions:    Your physician recommends that you continue on your current medications as directed. Please refer to the Current Medication list given to you today.   If you need a refill on your cardiac medications before your next appointment, please call your pharmacy.  Labwork: NONE ORDERED  TODAY    Testing/Procedures: NONE ORDERED  TODAY    Follow-Up: Your physician wants you to follow-up in:  [place] will receive a reminder letter in the mail two months in advance. If you don't receive a letter, please call our office to schedule the follow-up appointment.   Remote monitoring is used to monitor your Pacemaker of ICD from home. This monitoring reduces the number of office visits required to check your device to one time per year. It allows [place] to keep an eye on the functioning of your device to ensure it is working properly. You are scheduled for a device check from home on . 7*27*18 You may send your transmission at any time that day. If you have a wireless device, the transmission will be sent automatically. After your physician reviews your transmission, you will receive a postcard with your next transmission date.    Any Other Special Instructions Will Be Listed Below (If Applicable).

## 2016-12-29 ENCOUNTER — Telehealth: Payer: Self-pay | Admitting: Cardiology

## 2016-12-29 ENCOUNTER — Encounter: Payer: BLUE CROSS/BLUE SHIELD | Admitting: *Deleted

## 2016-12-29 NOTE — Telephone Encounter (Signed)
Spoke with pt and reminded pt of remote transmission that is due today. Pt verbalized understanding.   

## 2016-12-31 ENCOUNTER — Encounter: Payer: Self-pay | Admitting: Cardiology

## 2017-01-21 ENCOUNTER — Telehealth: Payer: Self-pay | Admitting: Physician Assistant

## 2017-01-21 NOTE — Telephone Encounter (Signed)
Request For Amendment of Health Information mailed to pt home address.

## 2017-02-17 ENCOUNTER — Other Ambulatory Visit: Payer: Self-pay | Admitting: Physician Assistant

## 2017-03-23 ENCOUNTER — Other Ambulatory Visit: Payer: Self-pay | Admitting: Cardiology

## 2017-03-25 NOTE — Addendum Note (Signed)
Addended by: Derl Barrow on: 03/25/2017 12:33 PM   Modules accepted: Orders

## 2017-05-07 ENCOUNTER — Other Ambulatory Visit: Payer: Self-pay | Admitting: Cardiology

## 2017-05-11 NOTE — Telephone Encounter (Signed)
Medication Detail    Disp Refills Start End   lisinopril (PRINIVIL,ZESTRIL) 10 MG tablet 180 tablet 2 03/24/2017    Sig: TAKE 1 TABLET(10 MG) BY MOUTH TWICE DAILY   Sent to pharmacy as: lisinopril (PRINIVIL,ZESTRIL) 10 MG tablet   Notes to Pharmacy: Was taking two 5 mg tabs twice daily. Please make pt aware that she only needs to take ONE 10 mg tab twice daily. Thanks!   E-Prescribing Status: Receipt confirmed by pharmacy (03/24/2017 8:58 AM EDT)   Pharmacy   [id number] - [place], [address] AT [address]

## 2017-05-19 NOTE — Progress Notes (Signed)
Cardiology Office Note   Date:  05/21/2017   ID:  Nancy Drake, DOB August 12, 1961, MRN [id number]  PCP:  Nancy Shanks, MD  Cardiologist:  Nancy. Marlou Drake EP  Nancy. Caryl Drake    Chief Complaint  Patient presents with  . Hypertension      History of Present Illness: Nancy Drake is a 56 y.o. female who presents for cardiomyopathy. HTN.  She has a history of syncope, NICM, LBBB w/CRT-P, and anxiety (6/13, she went to the ER for a panic attack and [facility] schizophreniform disorder. Followed by Nancy. Caryl Drake, last seen by him in April 2017, at that time doing well.   She was last seen by cardiology service, L. Servando Snare, NP planned for an echo and monitoring of her BP with higher reading then,   Device information: MDT CRT-P, implanted 04/11/12, Nancy. Lovena Drake, follows with Nancy. Caryl Drake for device and Nancy Nancy Drake for general cards.    She has been intolerant to ACE inhibitors and BB.  Echo 2014, with EF 40-45%  Last Echo  10/23/16 with EF 45-50% diffuse hypokinesis G1DD.  Mild MR and LA mildly dilated, trivial pericardial effusion.  Left heart cath in 3/12 showed EF improved somewhat to 45-50% and no angiographic coronary disease. Cardiac MRI showed EF 43% with no delayed enhancement. 3 week event monitor showed no significant arrhythmias.  Her cardiomyopathy was diagnosed after an episode of syncope in February 2012.    Her BP continues to be elevated.  No chest pain, no SOB.  She rides bike several miles without problems.  She discusses loss of child at birth.  She is able to talk about this which helps her deal with her loss.     Past Medical History:  Diagnosis Date  . Anxiety    lost a child , sees psych  . Biventricular cardiac pacemaker -Medtronic    DOI 2013  . Cardiomyopathy nonischemic    dx after a syncope 10-2010  . Complete heart block ([place]) 03-2012  . Mole of skin    bx 05-2013  . Thyroid disease    used to see Nancy Drake    Past  Surgical History:  Procedure Laterality Date  . BI-VENTRICULAR PACEMAKER INSERTION N/A 04/11/2012   Procedure: BI-VENTRICULAR PACEMAKER INSERTION (CRT-P);  Surgeon: Nancy Sprang, MD;  Location: [facility] CATH LAB;  Service: Cardiovascular;  Laterality: N/A;  . CESAREAN SECTION    . COLONOSCOPY     2010  . LEAD REVISION N/A 04/12/2012   Procedure: LEAD REVISION;  Surgeon: Nancy Lance, MD;  Location: [facility] CATH LAB;  Service: Cardiovascular;  Laterality: N/A;  . PACEMAKER INSERTION  03-2012  . TEMPORARY PACEMAKER INSERTION Right 04/08/2012   Procedure: TEMPORARY PACEMAKER INSERTION;  Surgeon: Nancy Blanks, MD;  Location: [facility] CATH LAB;  Service: Cardiovascular;  Laterality: Right;     Current Outpatient Prescriptions  Medication Sig Dispense Refill  . ALPRAZolam (XANAX) 0.25 MG tablet Take 0.25 mg by mouth at bedtime as needed for anxiety.    . carvedilol (COREG) 25 MG tablet TAKE 1 TABLET(25 MG) BY MOUTH TWICE DAILY WITH A MEAL 60 tablet 9  . lisinopril (PRINIVIL,ZESTRIL) 10 MG tablet Take 2 tablets (20 mg total) by mouth 2 (two) times daily. 360 tablet 1  . risperiDONE (RISPERDAL) 0.5 MG tablet Take 1 tablet (0.5 mg total) by mouth at bedtime. 30 tablet 9   No current facility-administered medications for this visit.  Allergies:   Ambien [zolpidem tartrate] and Desloratadine    Social History:  The patient  reports that she has never smoked. She has never used smokeless tobacco. She reports that she drinks alcohol. She reports that she does not use drugs.   Family History:  The patient's family history includes Breast cancer in her other; Cancer in her father; Diabetes in her mother; Heart disease in her other; Other in her other.    ROS:  General:no colds or fevers, no weight changes Skin:no rashes or ulcers HEENT:no blurred vision, no congestion CV:see HPI PUL:see HPI GI:no diarrhea constipation or melena, no indigestion GU:no hematuria, no dysuria MS:no joint pain, no  claudication Neuro:no syncope, no lightheadedness Endo:no diabetes, no thyroid disease  Wt Readings from Last 3 Encounters:  05/20/17 139 lb 1.9 oz (63.1 kg)  12/25/16 143 lb (64.9 kg)  10/20/16 146 lb 12.8 oz (66.6 kg)     PHYSICAL EXAM: VS:  BP (!) 160/80   Pulse 64   Ht 5\' 2"  (1.575 m)   Wt 139 lb 1.9 oz (63.1 kg)   SpO2 98%   BMI 25.45 kg/m  , BMI Body mass index is 25.45 kg/m. General:Pleasant affect, NAD Skin:Warm and dry, brisk capillary refill HEENT:normocephalic, sclera clear, mucus membranes moist Neck:supple, no JVD, no bruits  Heart:S1S2 RRR without murmur, gallup, rub or click Lungs:clear without rales, rhonchi, or wheezes GUR:KYHC, non tender, + BS, do not palpate liver spleen or masses Ext:no lower ext edema, 2+ pedal pulses, 2+ radial pulses Neuro:alert and oriented X 3, MAE, follows commands, + facial symmetry    EKG:  EKG is NOT ordered today.    Recent Labs: No results found for requested labs within last 8760 hours.    Lipid Panel    Component Value Date/Time   CHOL 195 06/29/2013 0933   TRIG 62.0 06/29/2013 0933   HDL 59.20 06/29/2013 0933   CHOLHDL 3 06/29/2013 0933   VLDL 12.4 06/29/2013 0933   LDLCALC 123 (H) 06/29/2013 0933       Other studies Reviewed: Additional studies/ records that were reviewed today include:  ECHO 10/23/16 . Study Conclusions  - Left ventricle: The cavity size was normal. Wall thickness was   normal. Systolic function was mildly reduced. The estimated   ejection fraction was in the range of 45% to 50%. Diffuse   hypokinesis. Doppler parameters are consistent with abnormal left   ventricular relaxation (grade 1 diastolic dysfunction). - Mitral valve: There was mild regurgitation. - Left atrium: The atrium was mildly dilated. - Pericardium, extracardiac: A trivial pericardial effusion was   identified.  Impressions:  - Mild global reduction in LV function; grade 1 diastolic   dysfunction; mild MR;  mild LAE; mild TR  ASSESSMENT AND PLAN:  1.  NICM- euvolemic, no SOB Follow up 6 months with Nancy. Marlou Drake  2.  HTN--BP elevated discussed adding amlodipine but she preferred to increase current meds. Lisinopril will be 20 mg BID she will follow up in 2 week with pharmacy for BP check and labs.  BMP.   3.  CRT-P per Nancy. Caryl Drake.    Current medicines are reviewed with the patient today.  The patient Has no concerns regarding medicines.  The following changes have been made:  See above Labs/ tests ordered today include:see above  Disposition:   FU:  see above  Signed, Cecilie Kicks, NP  05/21/2017 9:26 PM    [place], [place]  [place], [place] Phone: [phone]; Fax: [phone]

## 2017-05-20 ENCOUNTER — Encounter (INDEPENDENT_AMBULATORY_CARE_PROVIDER_SITE_OTHER): Payer: Self-pay

## 2017-05-20 ENCOUNTER — Encounter: Payer: Self-pay | Admitting: Cardiology

## 2017-05-20 ENCOUNTER — Ambulatory Visit (INDEPENDENT_AMBULATORY_CARE_PROVIDER_SITE_OTHER): Payer: BLUE CROSS/BLUE SHIELD | Admitting: Cardiology

## 2017-05-20 VITALS — BP 160/80 | HR 64 | Ht 62.0 in | Wt 139.1 lb

## 2017-05-20 DIAGNOSIS — I10 Essential (primary) hypertension: Secondary | ICD-10-CM

## 2017-05-20 DIAGNOSIS — I428 Other cardiomyopathies: Secondary | ICD-10-CM | POA: Diagnosis not present

## 2017-05-20 DIAGNOSIS — Z950 Presence of cardiac pacemaker: Secondary | ICD-10-CM

## 2017-05-20 DIAGNOSIS — Z79899 Other long term (current) drug therapy: Secondary | ICD-10-CM | POA: Diagnosis not present

## 2017-05-20 MED ORDER — LISINOPRIL 10 MG PO TABS: 20.0000 mg | ORAL_TABLET | Freq: Two times a day (BID) | ORAL | 1 refills | Status: DC

## 2017-05-20 NOTE — Patient Instructions (Signed)
Medication Instructions: Your physician has recommended you make the following change in your medication:  1) INCREASE Lisinopril (Prinivil) - Take 2 tablets (20 mg) by mouth twice daily  Labwork: Your physician recommends that you return for lab work in 2 weeks for a BMET  Procedures/Testing: None Ordered  Follow-Up: Your physician recommends that you schedule a follow-up appointment in: 2 weeks with Pharmacists for a BP check and BMET  Your physician wants you to follow-up in: 6 MONTHS with Dr. Marlou Porch.  You will receive a reminder letter in the mail two months in advance. If you don't receive a letter, please call our office to schedule the follow-up appointment.  If you need a refill on your cardiac medications before your next appointment, please call your pharmacy.

## 2017-05-21 ENCOUNTER — Encounter: Payer: Self-pay | Admitting: Cardiology

## 2017-05-24 ENCOUNTER — Telehealth: Payer: Self-pay | Admitting: Internal Medicine

## 2017-05-24 NOTE — Telephone Encounter (Signed)
New message     Needs a letter stating she can not go to court because of her blood pressure.
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
## 2017-05-24 NOTE — Telephone Encounter (Signed)
prob better to get note from her PCP Thanks

## 2017-05-25 NOTE — Telephone Encounter (Signed)
Attempted to call the patient- the mailbox was full- unable to leave a message.

## 2017-05-25 NOTE — Telephone Encounter (Signed)
I spoke with the patient. She states she is needing a note that she has appointments with our office on 06/08/17 as she is supposed to go to court on this date. She saw Cecilie Kicks, NP last week and was referred to the HTN clinic.  She is asking for notes supporting her why we are seeing her. I advised her that she may come by the office and sign a release for a copy of Karilyn Cota note from last week and we can also print a calendar of her upcoming appointments. She is agreeable with this plan and will come by to sign a release.

## 2017-06-04 ENCOUNTER — Ambulatory Visit: Payer: BLUE CROSS/BLUE SHIELD

## 2017-06-04 ENCOUNTER — Other Ambulatory Visit: Payer: BLUE CROSS/BLUE SHIELD
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
## 2017-06-08 ENCOUNTER — Other Ambulatory Visit: Payer: BLUE CROSS/BLUE SHIELD

## 2017-06-08 ENCOUNTER — Ambulatory Visit (INDEPENDENT_AMBULATORY_CARE_PROVIDER_SITE_OTHER): Payer: BLUE CROSS/BLUE SHIELD | Admitting: Pharmacist

## 2017-06-08 VITALS — BP 158/88 | HR 72

## 2017-06-08 DIAGNOSIS — I10 Essential (primary) hypertension: Secondary | ICD-10-CM | POA: Diagnosis not present

## 2017-06-08 DIAGNOSIS — Z79899 Other long term (current) drug therapy: Secondary | ICD-10-CM

## 2017-06-08 NOTE — Patient Instructions (Signed)
Continue taking your carvedilol and lisinopril twice daily  Call Megan in the blood pressure clinic if you would like to start a new medication for your blood pressure [phone]

## 2017-06-09 LAB — BASIC METABOLIC PANEL
BUN/Creatinine Ratio: 16 (ref 9–23)
BUN: 11 mg/dL (ref 6–24)
CALCIUM: 9.7 mg/dL (ref 8.7–10.2)
CHLORIDE: 102 mmol/L (ref 96–106)
CO2: 26 mmol/L (ref 20–29)
Creatinine, Ser: 0.7 mg/dL (ref 0.57–1.00)
GFR calc Af Amer: 114 mL/min/{1.73_m2} (ref >59)
GFR, EST NON AFRICAN AMERICAN: 99 mL/min/{1.73_m2} (ref >59)
GLUCOSE: 93 mg/dL (ref 65–99)
POTASSIUM: 3.6 mmol/L (ref 3.5–5.2)
Sodium: 146 mmol/L — ABNORMAL HIGH (ref 134–144)

## 2017-06-21 ENCOUNTER — Telehealth: Payer: Self-pay | Admitting: Internal Medicine

## 2017-06-21 NOTE — Telephone Encounter (Signed)
Call received this am from this patient. She was calling about Medical Certification For [employer] paperwork Dropped off Friday 10/19. She stated Dr.Klein was to complete this paperwork.      Patient got really rude on the phone started yelling at me asking me if she was speaking F------ [place] or Spanish and if I could understand what she was saying. I told patient I was unaware what she was talking about because I was not the one she spoke to at the front desk. She one again started to yell on the phone after this I told her to please calm down or I would hang up phone. She did not calm down phone call was ended.     After Speaking with Janett Billow my Co-worker Dr.Klein stated he will not be completing this paperwork this is not his patient. Her Father is a patient of Dr.Nishan and has Not been seen since last year. I will be speaking with Howie Ill about paperwork.

## 2017-06-21 NOTE — Telephone Encounter (Signed)
New message     Patient request to change from seeing Dr Caryl Comes to become a patient of Dr Lovena Le. Patient would not give reason for requesting a change.

## 2017-06-22 ENCOUNTER — Telehealth: Payer: Self-pay | Admitting: Cardiovascular Disease
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
## 2017-06-23 NOTE — Telephone Encounter (Signed)
[place] with me. I thinks that makes [place] even. GT

## 2017-06-23 NOTE — Telephone Encounter (Signed)
No problem for me

## 2017-06-29 NOTE — Telephone Encounter (Signed)
There was some confusion on the form because there were 2 patient names listed: this patient AND her father.  I requested a blank form from the power company and filled it out with only this patient's name.  Today I left a message on patient's C# informing her of these steps I've taken to get this sorted out. I told her that the last provider she saw was Nancy Kicks, PA-C therefore I have asked that Nancy Drake fill it out when she has office hours tomorrow.  I asked that the patient to let me know if she needs to pick up the form before her 07/06/17 appointment - the last time we spoke she indicated that she would be back on 07/06/17 for an appointment and would pick it up then.

## 2017-07-01 NOTE — Telephone Encounter (Signed)
Spoke with patient to let her know that the form was completed and will be at the front desk for whenever she wishes to pick it up.  I confirmed that she only needed a form for herself as the patient, not her father. Nancy Drake said that is correct.  Nancy Drake expressed appreciation and acknowledged that the form is ready for pick-up.

## 2017-07-06 ENCOUNTER — Ambulatory Visit: Payer: BLUE CROSS/BLUE SHIELD

## 2017-08-02 ENCOUNTER — Other Ambulatory Visit: Payer: Self-pay | Admitting: Internal Medicine

## 2017-08-17 ENCOUNTER — Telehealth: Payer: Self-pay | Admitting: Obstetrics and Gynecology

## 2017-08-17 ENCOUNTER — Encounter: Payer: Self-pay | Admitting: Obstetrics and Gynecology

## 2017-08-17 NOTE — Telephone Encounter (Signed)
A user error has taken place.

## 2017-11-17 ENCOUNTER — Other Ambulatory Visit: Payer: Self-pay | Admitting: Cardiology

## 2017-11-18 ENCOUNTER — Ambulatory Visit: Payer: BLUE CROSS/BLUE SHIELD | Admitting: Cardiology

## 2017-11-18 ENCOUNTER — Encounter: Payer: Self-pay | Admitting: *Deleted

## 2017-11-29 ENCOUNTER — Ambulatory Visit: Payer: BLUE CROSS/BLUE SHIELD | Admitting: Cardiology

## 2017-11-29 ENCOUNTER — Encounter: Payer: Self-pay | Admitting: Cardiology

## 2017-11-29 VITALS — BP 132/82 | HR 56 | Ht 62.0 in | Wt 136.0 lb

## 2017-11-29 DIAGNOSIS — I428 Other cardiomyopathies: Secondary | ICD-10-CM

## 2017-11-29 DIAGNOSIS — I5022 Chronic systolic (congestive) heart failure: Secondary | ICD-10-CM | POA: Diagnosis not present

## 2017-11-29 DIAGNOSIS — I10 Essential (primary) hypertension: Secondary | ICD-10-CM

## 2017-11-29 DIAGNOSIS — Z950 Presence of cardiac pacemaker: Secondary | ICD-10-CM | POA: Diagnosis not present

## 2017-11-29 NOTE — Progress Notes (Signed)
Cardiology Office Note    Date:  11/29/2017   ID:  Nancy Drake, DOB 28-Jan-1961, MRN [id number]  PCP:  Vernie Shanks, MD  Cardiologist:   Candee Furbish, MD Dr. Lynelle Doctor EP, former Dr. Aundra Dubin    History of Present Illness:  Sher Shampine is a 57 y.o. female here for evaluation of nonischemic cardiopathy at the request of Dr. Caryl Comes, left bundle branch block, prior syncope status post CRT implantation. Has been tolerant of both ACE inhibitor as well as beta blockers. Ejection fraction only mildly reduced at 40-45%-Echo 2014. Her cardio myopathy was diagnosed after an episode of syncope in February 2012.  No chest pain, no shortness of breath, no edema, no palpitations, no syncope  Biventricular cardiac pacemaker Medtronic. She had a lead revision and 2013 by Dr. Lovena Le.   In 1/12 while walking on a treadmill, patient felt lightheaded and flushed and passed out.  She landed on her face.  She was unconscious only for a few seconds she thinks.  She has had no syncopal or presyncopal episodes prior or since then.  As part of the workup for her syncopal episode, she had an echocardiogram.  This showed diffuse global LV hypokinesis.  EF was reported as 35-40%.  It looked more like 30% at the most to me.  As mentioned above, she has no history of chest pain.  No recent URI-type symptoms.  No history of HTN.  No family history of cardiomyopathy. She had a LBBB on ECG.  This had been seen in the past as well.   Left heart cath in 3/12 showed EF improved somewhat to 45-50% and no angiographic coronary disease.  Cardiac MRI showed EF 43% with no delayed enhancement.  3 week event monitor showed no significant arrhythmias.   Patient continues to do well symptomatically.   No exertional dyspnea or chest pain.  No further syncope.  In 6/13, she went to the ER for a panic attack and ended up with a 3 day stay at [facility].    She mentioned several things about stressors in her life, her  father. She mentioned having children at 3 AM in the morning. She mentioned how she does not much sleep usually. She's had some difficulty recently with labile blood pressures.  11/29/17 - Bike 25 miles.  Overall feeling quite well.  Occasionally will take a Xanax for anxiety.  She was telling the story about a friend of hers who she used to go to the cemetery with to visit her mother that recently passed away.  No syncope, no bleeding, no orthopnea.  Pacemaker is functioning well.  Past Medical History:  Diagnosis Date  . Anxiety    lost a child , sees psych  . Biventricular cardiac pacemaker -Medtronic    DOI 2013  . Cardiomyopathy nonischemic    dx after a syncope 10-2010  . Complete heart block ([place]) 03-2012  . Mole of skin    bx 05-2013  . Thyroid disease    used to see Dr Debbora Presto    Past Surgical History:  Procedure Laterality Date  . BI-VENTRICULAR PACEMAKER INSERTION N/A 04/11/2012   Procedure: BI-VENTRICULAR PACEMAKER INSERTION (CRT-P);  Surgeon: Deboraha Sprang, MD;  Location: [facility] CATH LAB;  Service: Cardiovascular;  Laterality: N/A;  . CESAREAN SECTION    . COLONOSCOPY     2010  . LEAD REVISION N/A 04/12/2012   Procedure: LEAD REVISION;  Surgeon: Evans Lance, MD;  Location: [facility] C[facility] LAB;  Service: Cardiovascular;  Laterality: N/A;  . PACEMAKER INSERTION  03-2012  . TEMPORARY PACEMAKER INSERTION Right 04/08/2012   Procedure: TEMPORARY PACEMAKER INSERTION;  Surgeon: Burnell Blanks, MD;  Location: [facility] CATH LAB;  Service: Cardiovascular;  Laterality: Right;    Current Medications: Outpatient Medications Prior to Visit  Medication Sig Dispense Refill  . ALPRAZolam (XANAX) 0.5 MG tablet Take 1 tablet by mouth daily as needed.  3  . carvedilol (COREG) 25 MG tablet TAKE 1 TABLET(25 MG) BY MOUTH TWICE DAILY WITH A MEAL 60 tablet 9  . lisinopril (PRINIVIL,ZESTRIL) 10 MG tablet TAKE 2 TABLETS BY MOUTH TWICE DAILY 360 tablet 0  . risperiDONE (RISPERDAL) 0.5 MG tablet Take 1 tablet  (0.5 mg total) by mouth at bedtime. 30 tablet 9  . ALPRAZolam (XANAX) 0.25 MG tablet Take 0.25 mg by mouth at bedtime as needed for anxiety.     No facility-administered medications prior to visit.      Allergies:   Ambien [zolpidem tartrate] and Desloratadine   Social History   Socioeconomic History  . Marital status: Divorced    Spouse name: Not on file  . Number of children: 2  . Years of education: Not on file  . Highest education level: Not on file  Occupational History  . Occupation: not working at present    Employer: [employer]  . Financial resource strain: Not on file  . Food insecurity:    Worry: Not on file    Inability: Not on file  . Transportation needs:    Medical: Not on file    Non-medical: Not on file  Tobacco Use  . Smoking status: Never Smoker  . Smokeless tobacco: Never Used  Substance and Sexual Activity  . Alcohol use: Yes    Alcohol/week: 0.0 oz    Comment: socially   . Drug use: No  . Sexual activity: Yes    Birth control/protection: Post-menopausal  Lifestyle  . Physical activity:    Days per week: Not on file    Minutes per session: Not on file  . Stress: Not on file  Relationships  . Social connections:    Talks on phone: Not on file    Gets together: Not on file    Attends religious service: Not on file    Active member of club or organization: Not on file    Attends meetings of clubs or organizations: Not on file    Relationship status: Not on file  Other Topics Concern  . Not on file  Social History Narrative   Born in [place], lives w/ father    2 child, lost one      Family History:  The patient's family history includes Breast cancer in her other; Cancer in her father; Diabetes in her mother; Heart disease in her other; Other in her other.   ROS:   Please see the history of present illness.    Review of Systems  All other systems reviewed and are negative.     PHYSICAL EXAM:   VS:  BP 132/82   Pulse (!) 56    Ht 5\' 2"  (1.575 m)   Wt 136 lb (61.7 kg)   BMI 24.87 kg/m    GEN: Well nourished, well developed, in no acute distress  HEENT: normal  Neck: no JVD, carotid bruits, or masses Cardiac: RRR; no murmurs, rubs, or gallops,no edema pacemaker Respiratory:  clear to auscultation bilaterally, normal work of breathing GI: soft, nontender, nondistended, + BS MS: no deformity or  atrophy  Skin: warm and dry, no rash Neuro:  Alert and Oriented x 3, Strength and sensation are intact Psych: euthymic mood, full affect   Wt Readings from Last 3 Encounters:  11/29/17 136 lb (61.7 kg)  05/20/17 139 lb 1.9 oz (63.1 kg)  12/25/16 143 lb (64.9 kg)      Studies/Labs Reviewed:   EKG:  EKG is ordered today 11/29/17 atrial sensed ventricular pacing 56 bpm  Recent Labs: 06/08/2017: BUN 11; Creatinine, Ser 0.70; Potassium 3.6; Sodium 146   Lipid Panel    Component Value Date/Time   CHOL 195 06/29/2013 0933   TRIG 62.0 06/29/2013 0933   HDL 59.20 06/29/2013 0933   CHOLHDL 3 06/29/2013 0933   VLDL 12.4 06/29/2013 0933   LDLCALC 123 (H) 06/29/2013 0933    Additional studies/ records that were reviewed today include:   ECHO 10/12/12:  - Left ventricle: Worse in the inferior base The cavity size was moderately dilated. Wall thickness was normal. Systolic function was mildly to moderately reduced. The estimated ejection fraction was in the range of 40% to 45%. Diffuse hypokinesis. - Mitral valve: Mild regurgitation. - Atrial septum: No defect or patent foramen ovale was identified.  10/23/16 - Left ventricle: The cavity size was normal. Wall thickness was   normal. Systolic function was mildly reduced. The estimated   ejection fraction was in the range of 45% to 50%. Diffuse   hypokinesis. Doppler parameters are consistent with abnormal left   ventricular relaxation (grade 1 diastolic dysfunction). - Mitral valve: There was mild regurgitation. - Left atrium: The atrium was mildly  dilated. - Pericardium, extracardiac: A trivial pericardial effusion was   identified.  Impressions:  - Mild global reduction in LV function; grade 1 diastolic   dysfunction; mild MR; mild LAE; mild TR.   ASSESSMENT:    1. Chronic systolic heart failure ([place])   2. Essential hypertension, benign   3. NICM (nonischemic cardiomyopathy) ([place])   4. Biventricular cardiac pacemaker in situ      PLAN:  In order of problems listed above:  Nonischemic cardiomyopathy/chronic systolic heart failure  - Appears to be euvolemic. Doing well.  NYHA class I.  no changes in medications. Continue with beta blocker and ACE inhibitor (increasing). Prior EF 40-45% now 45-50%.. Overall seems to be doing fairly well with regards to her cardiomyopathy.  - Her weight is down approximate 20 pounds and being stable. She enjoys cycling.  Continue the excellent exercise.  Medtronic biventricular pacemaker  - Doing well. Dr. Lovena Le will continue to follow.  Hypertension with heart failure  -Dr. Jacelyn Grip her primary physician has been following her.  She also saw our hypertension clinic.  Overall doing quite well with the lisinopril and carvedilol today.   Anxiety  - Tries not to take too much Xanax. She takes Risperdal. Per primary team. No significant changes.  Stable.  Medication Adjustments/Labs and Tests Ordered: Current medicines are reviewed at length with the patient today.  Concerns regarding medicines are outlined above.  Medication changes, Labs and Tests ordered today are listed in the Patient Instructions below. Patient Instructions  Medication Instructions:  The current medical regimen is effective;  continue present plan and medications.  Follow-Up: Follow up in 1 year with Dr. Marlou Porch.  You will receive a letter in the mail 2 months before you are due.  Please call us when you receive this letter to schedule your follow up appointment.  If you need a refill on your cardiac medications  before your next appointment, please call your pharmacy.  Thank you for choosing [facility]!!        Signed, Candee Furbish, MD  11/29/2017 12:14 PM    [facility] [address], [place], [place]  [address] Phone: [phone]; Fax: [phone]

## 2017-11-29 NOTE — Patient Instructions (Signed)

## 2017-12-17 ENCOUNTER — Ambulatory Visit: Payer: BLUE CROSS/BLUE SHIELD | Admitting: Internal Medicine

## 2017-12-17 ENCOUNTER — Encounter: Payer: Self-pay | Admitting: Internal Medicine

## 2017-12-17 VITALS — BP 126/74 | HR 62 | Ht 62.0 in | Wt 131.0 lb

## 2017-12-17 DIAGNOSIS — I110 Hypertensive heart disease with heart failure: Secondary | ICD-10-CM

## 2017-12-17 DIAGNOSIS — Z950 Presence of cardiac pacemaker: Secondary | ICD-10-CM | POA: Diagnosis not present

## 2017-12-17 DIAGNOSIS — I428 Other cardiomyopathies: Secondary | ICD-10-CM

## 2017-12-17 DIAGNOSIS — I5022 Chronic systolic (congestive) heart failure: Secondary | ICD-10-CM | POA: Diagnosis not present

## 2017-12-17 MED ORDER — CARVEDILOL 25 MG PO TABS: ORAL_TABLET | ORAL | 11 refills | Status: DC

## 2017-12-17 MED ORDER — LISINOPRIL 10 MG PO TABS: 20.0000 mg | ORAL_TABLET | Freq: Two times a day (BID) | ORAL | 3 refills | Status: DC

## 2017-12-17 NOTE — Progress Notes (Signed)
HPI Mrs. Bohle returns today for followup of her biv PPM. She is a pleasant 57 yo woman with a h/o chronic systolic heart failure, CHB, s/p biv PPM insertion. The patient underwent her device insertion about 5 years ago complicated by RV lead dislodgement with revision. She has class 1 cHF symptoms. She exercises regularly with no limitation. She has tolerated her beta blocker and ace inhibitor and feels well.  Allergies  Allergen Reactions  . Ambien [Zolpidem Tartrate] Other (See Comments)    Pt cannot take any sleeping pills.   . Desloratadine     Insomnia      Current Outpatient Medications  Medication Sig Dispense Refill  . ALPRAZolam (XANAX) 0.5 MG tablet Take 1 tablet by mouth daily as needed.  3  . carvedilol (COREG) 25 MG tablet TAKE 1 TABLET(25 MG) BY MOUTH TWICE DAILY WITH A MEAL 60 tablet 9  . lisinopril (PRINIVIL,ZESTRIL) 10 MG tablet TAKE 2 TABLETS BY MOUTH TWICE DAILY 360 tablet 0  . risperiDONE (RISPERDAL) 0.5 MG tablet Take 1 tablet (0.5 mg total) by mouth at bedtime. (Patient taking differently: Take 0.5 mg by mouth at bedtime as needed. ) 30 tablet 9   No current facility-administered medications for this visit.      Past Medical History:  Diagnosis Date  . Anxiety    lost a child , sees psych  . Biventricular cardiac pacemaker -Medtronic    DOI 2013  . Cardiomyopathy nonischemic    dx after a syncope 10-2010  . Complete heart block ([place]) 03-2012  . Mole of skin    bx 05-2013  . Thyroid disease    used to see Dr Debbora Presto    ROS:   All systems reviewed and negative except as noted in the HPI.   Past Surgical History:  Procedure Laterality Date  . BI-VENTRICULAR PACEMAKER INSERTION N/A 04/11/2012   Procedure: BI-VENTRICULAR PACEMAKER INSERTION (CRT-P);  Surgeon: Deboraha Sprang, MD;  Location: [facility] CATH LAB;  Service: Cardiovascular;  Laterality: N/A;  . CESAREAN SECTION    . COLONOSCOPY     2010  . LEAD REVISION N/A 04/12/2012   Procedure: LEAD  REVISION;  Surgeon: Evans Lance, MD;  Location: [facility] CATH LAB;  Service: Cardiovascular;  Laterality: N/A;  . PACEMAKER INSERTION  03-2012  . TEMPORARY PACEMAKER INSERTION Right 04/08/2012   Procedure: TEMPORARY PACEMAKER INSERTION;  Surgeon: Burnell Blanks, MD;  Location: [facility] CATH LAB;  Service: Cardiovascular;  Laterality: Right;     Family History  Problem Relation Age of Onset  . Other Other        uncle with pacemaker  . Heart disease Other        PACERMAKER  . Breast cancer Other        cousin  . Diabetes Mother   . Cancer Father        prostate cancer  . Colon cancer Neg Hx      Social History   Socioeconomic History  . Marital status: Divorced    Spouse name: Not on file  . Number of children: 2  . Years of education: Not on file  . Highest education level: Not on file  Occupational History  . Occupation: not working at present    Employer: [employer]  . Financial resource strain: Not on file  . Food insecurity:    Worry: Not on file    Inability: Not on file  . Transportation needs:  Medical: Not on file    Non-medical: Not on file  Tobacco Use  . Smoking status: Never Smoker  . Smokeless tobacco: Never Used  Substance and Sexual Activity  . Alcohol use: Yes    Alcohol/week: 0.0 oz    Comment: socially   . Drug use: No  . Sexual activity: Yes    Birth control/protection: Post-menopausal  Lifestyle  . Physical activity:    Days per week: Not on file    Minutes per session: Not on file  . Stress: Not on file  Relationships  . Social connections:    Talks on phone: Not on file    Gets together: Not on file    Attends religious service: Not on file    Active member of club or organization: Not on file    Attends meetings of clubs or organizations: Not on file    Relationship status: Not on file  . Intimate partner violence:    Fear of current or ex partner: Not on file    Emotionally abused: Not on file    Physically abused:  Not on file    Forced sexual activity: Not on file  Other Topics Concern  . Not on file  Social History Narrative   Born in [place], lives w/ father    2 child, lost one      BP 126/74   Pulse 62   Ht 5\' 2"  (1.575 m)   Wt 131 lb (59.4 kg)   BMI 23.96 kg/m   Physical Exam:  Well appearing 57 yo woman, NAD HEENT: Unremarkable Neck:  6 cm JVD, no thyromegally Lymphatics:  No adenopathy Back:  No CVA tenderness Lungs:  Clear with no wheezes HEART:  Regular rate rhythm, no murmurs, no rubs, no clicks Abd:  soft, positive bowel sounds, no organomegally, no rebound, no guarding Ext:  2 plus pulses, no edema, no cyanosis, no clubbing Skin:  No rashes no nodules Neuro:  CN II through XII intact, motor grossly intact  EKG -none  DEVICE  Normal device function.  See PaceArt for details.   Assess/Plan: 1. Chronic systolic heart failure - her symptoms are class 1. She will continue her current meds 2. CHB - she has no escape. She appears dependent. 3. PPM - her medtronic Biv PPM is working normally. We will recheck in several months. 4. HTN - her blood pressure is well controlled. She will continue her current meds. She is encouraged to maintain a low sodium diet.  Nancy Drake.D.

## 2017-12-17 NOTE — Patient Instructions (Signed)
Medication Instructions:  Your physician recommends that you continue on your current medications as directed. Please refer to the Current Medication list given to you today.  Labwork: None ordered.  Testing/Procedures: None ordered.  Follow-Up: Your physician wants you to follow-up in: one year with Dr. Lovena Le.  You will receive a reminder letter in the mail two months in advance. If you don't receive a letter, please call our office to schedule the follow-up appointment.  Remote monitoring is used to monitor your Pacemaker from home. This monitoring reduces the number of office visits required to check your device to one time per year. It allows [place] to keep an eye on the functioning of your device to ensure it is working properly. You are scheduled for a device check from home on 03/18/2018. You may send your transmission at any time that day. If you have a wireless device, the transmission will be sent automatically. After your physician reviews your transmission, you will receive a postcard with your next transmission date.  Any Other Special Instructions Will Be Listed Below (If Applicable).  If you need a refill on your cardiac medications before your next appointment, please call your pharmacy.

## 2017-12-21 ENCOUNTER — Other Ambulatory Visit: Payer: Self-pay | Admitting: Internal Medicine

## 2018-03-18 ENCOUNTER — Telehealth: Payer: Self-pay

## 2018-03-18 ENCOUNTER — Encounter: Payer: BLUE CROSS/BLUE SHIELD | Admitting: *Deleted

## 2018-03-18 NOTE — Telephone Encounter (Signed)
Spoke with pt and reminded pt of remote transmission that is due today. Pt verbalized understanding.   

## 2018-03-21 ENCOUNTER — Encounter: Payer: Self-pay | Admitting: Cardiology

## 2018-04-14 ENCOUNTER — Encounter: Payer: Self-pay | Admitting: Cardiology

## 2018-05-01 ENCOUNTER — Other Ambulatory Visit: Payer: Self-pay | Admitting: Cardiology

## 2018-06-23 ENCOUNTER — Encounter: Payer: Self-pay | Admitting: Cardiology

## 2018-06-29 ENCOUNTER — Telehealth: Payer: Self-pay

## 2018-06-29 NOTE — Telephone Encounter (Signed)
Pt called because she received a letter. She wants to start doing in clinic pacer check instead of doing home remote monitoring because she been having problems with Internet. I made her a device clinic appointment in November.

## 2018-08-03 ENCOUNTER — Other Ambulatory Visit: Payer: Self-pay

## 2018-08-03 ENCOUNTER — Emergency Department (HOSPITAL_COMMUNITY)
Admission: EM | Admit: 2018-08-03 | Discharge: 2018-08-03 | Disposition: A | Discharge status: Home / Self Care | Payer: BLUE CROSS/BLUE SHIELD | Attending: Emergency Medicine | Admitting: Emergency Medicine

## 2018-08-03 ENCOUNTER — Encounter (HOSPITAL_COMMUNITY): Payer: Self-pay

## 2018-08-03 DIAGNOSIS — I10 Essential (primary) hypertension: Secondary | ICD-10-CM | POA: Diagnosis not present

## 2018-08-03 DIAGNOSIS — F410 Panic disorder [episodic paroxysmal anxiety] without agoraphobia: Secondary | ICD-10-CM | POA: Insufficient documentation

## 2018-08-03 DIAGNOSIS — Z79899 Other long term (current) drug therapy: Secondary | ICD-10-CM | POA: Diagnosis not present

## 2018-08-03 NOTE — ED Provider Notes (Signed)
[place] EMERGENCY DEPARTMENT Provider Note   CSN: [id number] Arrival date & time: 08/03/18  6606     History   Chief Complaint Chief Complaint  Patient presents with  . Anxiety    HPI Nancy Drake is a 57 y.o. female presenting today for panic attack.  Patient states that she was at the [facility] court house today being seen in court after being accused of trespassing in July.  Patient states that due to her increase in stress and having to deal with multiple court cases caused her panic attack today.  Patient states that she occasionally has panic attacks, states that this was a normal panic attack for her.  Patient states that she took 0.5 mg of Xanax prior to arrival with complete resolution of her symptoms.   Patient states that she is feeling well now and is without complaint.  Denies chest pain, shortness of breath, abdominal pain, nausea or vomiting. Patient is alert and oriented, denies homicidal/suicidal ideations, denies hallucinations.  Of note patient with history of nonischemic cardiomyopathy and ventricular pacemaker.  Patient denies problems with her pacemaker, states that she regularly follows up with her cardiologist regarding this and is not concerned with her pacemaker or heart at this time.  HPI  Past Medical History:  Diagnosis Date  . Anxiety    lost a child , sees psych  . Biventricular cardiac pacemaker -Medtronic    DOI 2013  . Cardiomyopathy nonischemic    dx after a syncope 10-2010  . Complete heart block ([place]) 03-2012  . Mole of skin    bx 05-2013  . Thyroid disease    used to see Dr Debbora Presto    Patient Active Problem List   Diagnosis Date Noted  . Anxiety and depression 02/12/2014  . Other and unspecified hyperlipidemia 02/12/2014  . Right foot pain 02/12/2014  . Essential hypertension, benign 02/12/2014  . Mole of skin 06/29/2013  . Biventricular cardiac pacemaker -Medtronic   . CHB (complete heart block)  ([place]) 04/08/2012  . Nonischemic cardiomyopathy ([place]) 11/03/2010  . CHRONIC SYSTOLIC HEART FAILURE 30/16/0109  . ANXIETY 10/17/2006    Past Surgical History:  Procedure Laterality Date  . BI-VENTRICULAR PACEMAKER INSERTION N/A 04/11/2012   Procedure: BI-VENTRICULAR PACEMAKER INSERTION (CRT-P);  Surgeon: Deboraha Sprang, MD;  Location: [facility] CATH LAB;  Service: Cardiovascular;  Laterality: N/A;  . CESAREAN SECTION    . COLONOSCOPY     2010  . LEAD REVISION N/A 04/12/2012   Procedure: LEAD REVISION;  Surgeon: Evans Lance, MD;  Location: [facility] CATH LAB;  Service: Cardiovascular;  Laterality: N/A;  . PACEMAKER INSERTION  03-2012  . TEMPORARY PACEMAKER INSERTION Right 04/08/2012   Procedure: TEMPORARY PACEMAKER INSERTION;  Surgeon: Burnell Blanks, MD;  Location: [facility] CATH LAB;  Service: Cardiovascular;  Laterality: Right;     OB History   None      Home Medications    Prior to Admission medications   Medication Sig Start Date End Date Taking? Authorizing Provider  ALPRAZolam Duanne Moron) 0.5 MG tablet Take 1 tablet by mouth daily as needed. 10/29/17   [provider]  carvedilol (COREG) 25 MG tablet TAKE 1 TABLET(25 MG) BY MOUTH TWICE DAILY WITH A MEAL 12/17/17   Evans Lance, MD  lisinopril (PRINIVIL,ZESTRIL) 10 MG tablet TAKE 2 TABLETS BY MOUTH TWICE DAILY 05/04/18   Isaiah Serge, NP  risperiDONE (RISPERDAL) 0.5 MG tablet Take 1 tablet (0.5 mg total) by mouth at bedtime. Patient taking  differently: Take 0.5 mg by mouth at bedtime as needed.  10/20/16   Burtis Junes, NP    Family History Family History  Problem Relation Age of Onset  . Other Other        uncle with pacemaker  . Heart disease Other        PACERMAKER  . Breast cancer Other        cousin  . Diabetes Mother   . Cancer Father        prostate cancer  . Colon cancer Neg Hx     Social History Social History   Tobacco Use  . Smoking status: Never Smoker  . Smokeless tobacco: Never Used  Substance Use  Topics  . Alcohol use: Yes    Comment: socially   . Drug use: No     Allergies   Ambien [zolpidem tartrate] and Desloratadine   Review of Systems Review of Systems  Constitutional: Negative.  Negative for chills and fever.  Eyes: Negative.  Negative for visual disturbance.  Respiratory: Negative.  Negative for shortness of breath.   Cardiovascular: Negative.  Negative for chest pain.  Gastrointestinal: Negative.  Negative for abdominal pain, nausea and vomiting.  Musculoskeletal: Negative.  Negative for arthralgias and myalgias.  Neurological: Negative.  Negative for dizziness, syncope, weakness, light-headedness, numbness and headaches.  Psychiatric/Behavioral: Negative for agitation, confusion, hallucinations, self-injury and suicidal ideas. The patient is nervous/anxious.    Physical Exam Updated Vital Signs BP (!) 164/86 (BP Location: Right Arm)   Pulse 71   Temp 97.7 F (36.5 C) (Oral)   Resp 20   SpO2 98%   Physical Exam  Constitutional: She is oriented to person, place, and time. She appears well-developed and well-nourished. No distress.  HENT:  Head: Normocephalic and atraumatic.  Right Ear: External ear normal.  Left Ear: External ear normal.  Nose: Nose normal.  Mouth/Throat: Uvula is midline, oropharynx is clear and moist and mucous membranes are normal.  Eyes: Pupils are equal, round, and reactive to light. Conjunctivae and EOM are normal.  Neck: Trachea normal, normal range of motion, full passive range of motion without pain and phonation normal. Neck supple. No tracheal deviation present.  Cardiovascular: Normal rate, regular rhythm, normal heart sounds and intact distal pulses.  Pulses:      Dorsalis pedis pulses are 2+ on the right side, and 2+ on the left side.       Posterior tibial pulses are 2+ on the right side, and 2+ on the left side.  Pulmonary/Chest: Effort normal and breath sounds normal. No respiratory distress. She exhibits no tenderness, no  crepitus and no deformity.  Abdominal: Soft. There is no tenderness. There is no rebound and no guarding.  Musculoskeletal: Normal range of motion.       Right lower leg: Normal.       Left lower leg: Normal.  Feet:  Right Foot:  Protective Sensation: 3 sites tested. 3 sites sensed.  Left Foot:  Protective Sensation: 3 sites tested. 3 sites sensed.  Neurological: She is alert and oriented to person, place, and time. GCS eye subscore is 4. GCS verbal subscore is 5. GCS motor subscore is 6.  Speech is clear and goal oriented, follows commands Major Cranial nerves without deficit, no facial droop Normal strength in upper and lower extremities bilaterally including dorsiflexion and plantar flexion, strong and equal grip strength Sensation normal to light touch Moves extremities without ataxia, coordination intact Normal finger to nose and rapid alternating movements  Neg romberg, no pronator drift Normal gait  Skin: Skin is warm and dry. Capillary refill takes less than 2 seconds.  Psychiatric: She has a normal mood and affect. Her speech is normal and behavior is normal. Judgment and thought content normal. She is not actively hallucinating. Cognition and memory are normal. She expresses no homicidal and no suicidal ideation. She expresses no suicidal plans and no homicidal plans. She is attentive.   ED Treatments / Results  Labs (all labs ordered are listed, but only abnormal results are displayed) Labs Reviewed - No data to display  EKG None  Radiology No results found.  Procedures Procedures (including critical care time)  Medications Ordered in ED Medications - No data to display   Initial Impression / Assessment and Plan / ED Course  I have reviewed the triage vital signs and the nursing notes.  Pertinent labs & imaging results that were available during my care of the patient were reviewed by me and considered in my medical decision making (see chart for details).      57 year old female presents to the emergency department complaining of symptoms consistent with anxiety.  Patient has a history of same with similar episodes.  The patient is resting comfortably, in no apparent distress and asymptomatic.  Patient states that her symptoms have completely resolved following her 0.5 mg of Xanax prior to arrival.  Patient is without complaint at this time.  Patient denies suicidal or homicidal ideations.  Patient is attentive, cooperative and pleasant.  Nursing note mentions bizarre comments. Patient did mention that since she is from [place] that many people do have difficulty understanding her here in the [place]. On my exam patient is logical and reasonable throughout our long discussion, speech is normal, patient's behavior is normal, patient's thought content appears normal, patient's cognition and memory as well as her judgment appear normal at this time.  Patient is attentive.  Patient denies suicidal or homicidal ideations.  She denies hallucinations.  Patient does not appear to be a threat to herself or others at this time.  Patient's husband at bedside, Nancy Drake corroborates the patient's story.  They are requesting a note today to excuse the patient from her court date tomorrow.  At discharge patient is afebrile, not tachycardic, not hypotensive, well-appearing and in no acute distress.  Patient denies chest pain or shortness of breath.  Patient states that she is feeling very well at this time and is without complaint.  At this time there does not appear to be any evidence of an acute emergency medical condition and the patient appears stable for discharge with appropriate outpatient follow up. Diagnosis was discussed with patient who verbalizes understanding of care plan and is agreeable to discharge. I have discussed return precautions with patient and husband who verbalize understanding of return precautions. Patient strongly encouraged to follow-up with  their PCP within one week. All questions answered.  Patient's case was discussed with Dr. Venora Maples who is also reviewed the patient's EKG today.  Dr. Venora Maples agrees that the patient may be discharged at this time with primary care follow-up.  Note: Portions of this report may have been transcribed using voice recognition software. Every effort was made to ensure accuracy; however, inadvertent computerized transcription errors may still be present. Final Clinical Impressions(s) / ED Diagnoses   Final diagnoses:  Anxiety attack    ED Discharge Orders    None       Deliah Boston, PA-C 08/03/18 1134  Jola Schmidt, MD 08/03/18 [phone]

## 2018-08-03 NOTE — ED Notes (Signed)
Got patient vitals patient is resting with call bell in reach 

## 2018-08-03 NOTE — Discharge Instructions (Signed)
You have been diagnosed today with Anxiety Attack.  At this time there does not appear to be the presence of an emergent medical condition, however there is always the potential for conditions to change. Please read and follow the below instructions.  Please return to the Emergency Department immediately for any new or worsening symptoms. Please be sure to follow up with your Primary Care Provider next week regarding your visit today; please call their office to schedule an appointment even if you are feeling better for a follow-up visit. Your blood pressure was elevated today.  Please be sure to take your blood pressure medication as directed by your primary care provider and follow-up with them for blood pressure recheck in the next week.  Please avoid anxiety inducing activities to help with your symptoms.  Please read the additional information packets attached to your discharge summary.  Do not take your medicine if  develop an itchy rash, swelling in your mouth or lips, or difficulty breathing.

## 2018-08-03 NOTE — ED Triage Notes (Signed)
[facility]- pt coming from court with an anxiety attack. Pt making bizarre comments on arrival. Ambulatory, no distress noted.   136/98 76bpm cbg 75

## 2018-08-04 ENCOUNTER — Other Ambulatory Visit (HOSPITAL_COMMUNITY): Payer: Self-pay | Admitting: Psychiatry

## 2018-08-13 ENCOUNTER — Other Ambulatory Visit: Payer: Self-pay | Admitting: Internal Medicine

## 2018-08-23 ENCOUNTER — Other Ambulatory Visit: Payer: Self-pay | Admitting: Internal Medicine

## 2018-08-23 MED ORDER — CARVEDILOL 25 MG PO TABS: ORAL_TABLET | ORAL | 3 refills | Status: DC

## 2018-08-23 NOTE — Telephone Encounter (Signed)
Pt's medication was sent to pt's pharmacy as requested. Confirmation received.  °

## 2018-10-03 ENCOUNTER — Encounter: Payer: BLUE CROSS/BLUE SHIELD | Admitting: Physician Assistant

## 2018-10-06 ENCOUNTER — Encounter (HOSPITAL_COMMUNITY): Payer: Self-pay | Admitting: Emergency Medicine

## 2018-10-06 ENCOUNTER — Other Ambulatory Visit: Payer: Self-pay

## 2018-10-06 ENCOUNTER — Emergency Department (EMERGENCY_DEPARTMENT_HOSPITAL)
Admission: EM | Admit: 2018-10-06 | Discharge: 2018-10-07 | Disposition: A | Discharge status: Other Acute Inpatient Hospital | Payer: BLUE CROSS/BLUE SHIELD | Source: Home / Self Care | Attending: Emergency Medicine | Admitting: Emergency Medicine

## 2018-10-06 ENCOUNTER — Emergency Department (HOSPITAL_COMMUNITY): Payer: BLUE CROSS/BLUE SHIELD

## 2018-10-06 DIAGNOSIS — I110 Hypertensive heart disease with heart failure: Secondary | ICD-10-CM | POA: Insufficient documentation

## 2018-10-06 DIAGNOSIS — F411 Generalized anxiety disorder: Secondary | ICD-10-CM | POA: Diagnosis present

## 2018-10-06 DIAGNOSIS — Z79899 Other long term (current) drug therapy: Secondary | ICD-10-CM

## 2018-10-06 DIAGNOSIS — F23 Brief psychotic disorder: Secondary | ICD-10-CM | POA: Diagnosis present

## 2018-10-06 DIAGNOSIS — R45850 Homicidal ideations: Secondary | ICD-10-CM

## 2018-10-06 DIAGNOSIS — I5022 Chronic systolic (congestive) heart failure: Secondary | ICD-10-CM

## 2018-10-06 DIAGNOSIS — F3113 Bipolar disorder, current episode manic without psychotic features, severe: Secondary | ICD-10-CM | POA: Insufficient documentation

## 2018-10-06 DIAGNOSIS — Z950 Presence of cardiac pacemaker: Secondary | ICD-10-CM

## 2018-10-06 LAB — COMPREHENSIVE METABOLIC PANEL
ALBUMIN: 4.5 g/dL (ref 3.5–5.0)
ALT: 24 U/L (ref 0–44)
AST: 33 U/L (ref 15–41)
Alkaline Phosphatase: 60 U/L (ref 38–126)
Anion gap: 11 (ref 5–15)
BUN: 22 mg/dL — ABNORMAL HIGH (ref 6–20)
CO2: 24 mmol/L (ref 22–32)
Calcium: 9.5 mg/dL (ref 8.9–10.3)
Chloride: 102 mmol/L (ref 98–111)
Creatinine, Ser: 0.87 mg/dL (ref 0.44–1.00)
GFR calc Af Amer: >60 mL/min (ref >60)
GFR calc non Af Amer: >60 mL/min (ref >60)
Glucose, Bld: 157 mg/dL — ABNORMAL HIGH (ref 70–99)
Potassium: 3.6 mmol/L (ref 3.5–5.1)
Sodium: 137 mmol/L (ref 135–145)
Total Bilirubin: 0.7 mg/dL (ref 0.3–1.2)
Total Protein: 7.8 g/dL (ref 6.5–8.1)

## 2018-10-06 LAB — ETHANOL: Alcohol, Ethyl (B): <10 mg/dL (ref <10)

## 2018-10-06 LAB — CBC WITH DIFFERENTIAL/PLATELET
Abs Immature Granulocytes: 0.06 10*3/uL (ref 0.00–0.07)
BASOS ABS: 0.1 10*3/uL (ref 0.0–0.1)
Basophils Relative: 1 %
EOS PCT: 2 %
Eosinophils Absolute: 0.3 10*3/uL (ref 0.0–0.5)
HCT: 38.5 % (ref 36.0–46.0)
Hemoglobin: 12.3 g/dL (ref 12.0–15.0)
Immature Granulocytes: 0 %
LYMPHS PCT: 22 %
Lymphs Abs: 3 10*3/uL (ref 0.7–4.0)
MCH: 30.5 pg (ref 26.0–34.0)
MCHC: 31.9 g/dL (ref 30.0–36.0)
MCV: 95.5 fL (ref 80.0–100.0)
Monocytes Absolute: 0.9 10*3/uL (ref 0.1–1.0)
Monocytes Relative: 7 %
Neutro Abs: 9.1 10*3/uL — ABNORMAL HIGH (ref 1.7–7.7)
Neutrophils Relative %: 68 %
Platelets: 352 10*3/uL (ref 150–400)
RBC: 4.03 MIL/uL (ref 3.87–5.11)
RDW: 13 % (ref 11.5–15.5)
WBC: 13.4 10*3/uL — ABNORMAL HIGH (ref 4.0–10.5)
nRBC: 0 % (ref 0.0–0.2)

## 2018-10-06 LAB — RAPID URINE DRUG SCREEN, HOSP PERFORMED
Amphetamines: NOT DETECTED
Barbiturates: NOT DETECTED
Benzodiazepines: POSITIVE — AB
Cocaine: NOT DETECTED
Opiates: NOT DETECTED
Tetrahydrocannabinol: NOT DETECTED

## 2018-10-06 MED ORDER — IBUPROFEN 800 MG PO TABS
800.0000 mg | ORAL_TABLET | Freq: Once | ORAL | Status: AC
  Administered 2018-10-06: 800 mg via ORAL
  Filled 2018-10-06: qty 1

## 2018-10-06 MED ORDER — STERILE WATER FOR INJECTION IJ SOLN
INTRAMUSCULAR | Status: AC
  Filled 2018-10-06: qty 10

## 2018-10-06 MED ORDER — RISPERIDONE 0.5 MG PO TABS
0.5000 mg | ORAL_TABLET | Freq: Every day | ORAL | Status: DC
  Filled 2018-10-06: qty 1

## 2018-10-06 MED ORDER — ALPRAZOLAM 0.5 MG PO TABS
0.5000 mg | ORAL_TABLET | Freq: Three times a day (TID) | ORAL | Status: DC
  Administered 2018-10-06 – 2018-10-07 (×4): 0.5 mg via ORAL
  Filled 2018-10-06 (×4): qty 1

## 2018-10-06 MED ORDER — ZIPRASIDONE MESYLATE 20 MG IM SOLR
10.0000 mg | Freq: Once | INTRAMUSCULAR | Status: AC
  Administered 2018-10-06: 10 mg via INTRAMUSCULAR

## 2018-10-06 MED ORDER — ZIPRASIDONE MESYLATE 20 MG IM SOLR
INTRAMUSCULAR | Status: AC
  Filled 2018-10-06: qty 20

## 2018-10-06 NOTE — ED Notes (Signed)
Pt oriented to room and unit.  Pt is withdrawn and irritable.  Pt does not engage in assessment.  Pt was given some water as requested.  15 minute checks and video monitoring in place.

## 2018-10-06 NOTE — ED Provider Notes (Signed)
  Physical Exam  BP (!) 189/98   Pulse 76   Temp 98 F (36.7 C) (Oral)   Resp 18   Wt 59 kg   SpO2 100%   BMI 23.78 kg/m   Physical Exam  ED Course/Procedures     Procedures  MDM  Lab work and x-ray reassuring.  Patient is medically cleared.       Davonna Belling, MD 10/06/18 [phone]

## 2018-10-06 NOTE — ED Triage Notes (Signed)
PER [employer]-patient left doctors office and stated that she was going to kill everyone at the office.  Patient IVC by doctor's office.  Brought in by [employer].

## 2018-10-06 NOTE — ED Notes (Signed)
Bed: WA31 Expected date:  Expected time:  Means of arrival:  Comments: 

## 2018-10-06 NOTE — ED Notes (Signed)
Hyperverbal, very angential and at times loud and needed to be asked to quiet down. She does follow direction. She seeks staff out to talk and tells staff about saving people and storms and relates a number of people she knows that had loved ones die and she herself had a child die.  She does not ask questions of staff, its a one way conversation. She can be interrupted but once she listens to what writer says and answers she returns to her conversation about the many things that have happened to her many of them would be considered negative. No behavior issues. No complaints voiced. She refused the Risperdal ordered for her at [facility] states Dr Casimiro Needle who is her psychiatrist took her off of it and doesn't want it restarted. She did take the Xanax. She is in her room "meditating"

## 2018-10-06 NOTE — ED Provider Notes (Signed)
[place] DEPT Provider Note   CSN: [id number] Arrival date & time: 10/06/18  1503     History   Chief Complaint Chief Complaint  Patient presents with  . Homicidal    HPI Nancy Drake is a 58 y.o. female.  HPI Patient making homicidal threats.  Patient was seen being seen by her outpatient therapist and she was making threats regarding killing people in the office.  IVC paperwork was filed and approved by [employer].  Both police confronted the patient and had to restrain her.  She is brought in police custody.  Level 5 caveat due to acute psychiatric disorder. Past Medical History:  Diagnosis Date  . Anxiety    lost a child , sees psych  . Biventricular cardiac pacemaker -Medtronic    DOI 2013  . Cardiomyopathy nonischemic    dx after a syncope 10-2010  . Complete heart block ([place]) 03-2012  . Mole of skin    bx 05-2013  . Thyroid disease    used to see Dr Debbora Presto    Patient Active Problem List   Diagnosis Date Noted  . Anxiety and depression 02/12/2014  . Other and unspecified hyperlipidemia 02/12/2014  . Right foot pain 02/12/2014  . Essential hypertension, benign 02/12/2014  . Mole of skin 06/29/2013  . Biventricular cardiac pacemaker -Medtronic   . CHB (complete heart block) ([place]) 04/08/2012  . Nonischemic cardiomyopathy ([place]) 11/03/2010  . CHRONIC SYSTOLIC HEART FAILURE 71/69/6789  . ANXIETY 10/17/2006    Past Surgical History:  Procedure Laterality Date  . BI-VENTRICULAR PACEMAKER INSERTION N/A 04/11/2012   Procedure: BI-VENTRICULAR PACEMAKER INSERTION (CRT-P);  Surgeon: Deboraha Sprang, MD;  Location: [facility] CATH LAB;  Service: Cardiovascular;  Laterality: N/A;  . CESAREAN SECTION    . COLONOSCOPY     2010  . LEAD REVISION N/A 04/12/2012   Procedure: LEAD REVISION;  Surgeon: Evans Lance, MD;  Location: [facility] CATH LAB;  Service: Cardiovascular;  Laterality: N/A;  . PACEMAKER INSERTION  03-2012  . TEMPORARY PACEMAKER  INSERTION Right 04/08/2012   Procedure: TEMPORARY PACEMAKER INSERTION;  Surgeon: Burnell Blanks, MD;  Location: [facility] CATH LAB;  Service: Cardiovascular;  Laterality: Right;     OB History   No obstetric history on file.      Home Medications    Prior to Admission medications   Medication Sig Start Date End Date Taking? Authorizing Provider  ALPRAZolam Duanne Moron) 0.5 MG tablet Take 1 tablet by mouth daily as needed. 10/29/17   [provider]  carvedilol (COREG) 25 MG tablet TAKE 1 TABLET(25 MG) BY MOUTH TWICE DAILY WITH A MEAL 08/23/18   Evans Lance, MD  lisinopril (PRINIVIL,ZESTRIL) 10 MG tablet TAKE 2 TABLETS BY MOUTH TWICE DAILY 05/04/18   Isaiah Serge, NP  risperiDONE (RISPERDAL) 0.5 MG tablet Take 1 tablet (0.5 mg total) by mouth at bedtime. Patient taking differently: Take 0.5 mg by mouth at bedtime as needed.  10/20/16   Burtis Junes, NP    Family History Family History  Problem Relation Age of Onset  . Other Other        uncle with pacemaker  . Heart disease Other        PACERMAKER  . Breast cancer Other        cousin  . Diabetes Mother   . Cancer Father        prostate cancer  . Colon cancer Neg Hx     Social History Social History  Tobacco Use  . Smoking status: Never Smoker  . Smokeless tobacco: Never Used  Substance Use Topics  . Alcohol use: Yes    Comment: socially   . Drug use: No     Allergies   Ambien [zolpidem tartrate] and Desloratadine   Review of Systems Review of Systems  Unable to perform ROS: Psychiatric disorder     Physical Exam Updated Vital Signs BP (!) 189/98   Pulse 76   Temp 98 F (36.7 C) (Oral)   Resp 18   Wt 59 kg   SpO2 100%   BMI 23.78 kg/m   Physical Exam Vitals signs and nursing note reviewed.  Constitutional:      Appearance: She is well-developed.  HENT:     Head: Normocephalic.     Comments: Mild erythema to the left cheek.  No underlying bony tenderness or deformity.  Intraoral no  intraoral trauma.  No obvious scalp trauma.    Nose: Nose normal.     Mouth/Throat:     Mouth: Mucous membranes are moist.  Eyes:     Extraocular Movements: Extraocular movements intact.     Pupils: Pupils are equal, round, and reactive to light.  Neck:     Musculoskeletal: Normal range of motion and neck supple. No neck rigidity or muscular tenderness.     Comments: No posterior midline cervical tenderness to palpation.  No meningismus. Cardiovascular:     Rate and Rhythm: Normal rate and regular rhythm.     Heart sounds: No murmur. No friction rub. No gallop.   Pulmonary:     Effort: Pulmonary effort is normal.     Breath sounds: Normal breath sounds.  Abdominal:     General: Bowel sounds are normal.     Palpations: Abdomen is soft.     Tenderness: There is no abdominal tenderness. There is no guarding or rebound.  Musculoskeletal: Normal range of motion.        General: Tenderness present.     Comments: Patient has mild tenderness and swelling to the right wrist.  There is some overlying erythema.  Full range of motion.  Pulses intact.  No midline thoracic or lumbar tenderness.  Pelvis is stable.  Lymphadenopathy:     Cervical: No cervical adenopathy.  Skin:    General: Skin is warm and dry.     Capillary Refill: Capillary refill takes less than 2 seconds.     Findings: Erythema present. No rash.  Neurological:     General: No focal deficit present.     Mental Status: She is alert.  Psychiatric:     Comments: Pressured speech.  Screaming out.  Erratic behavior.      ED Treatments / Results  Labs (all labs ordered are listed, but only abnormal results are displayed) Labs Reviewed  CBC WITH DIFFERENTIAL/PLATELET  COMPREHENSIVE METABOLIC PANEL  ETHANOL  RAPID URINE DRUG SCREEN, HOSP PERFORMED    EKG None  Radiology No results found.  Procedures Procedures (including critical care time)  Medications Ordered in ED Medications  sterile water (preservative free)  injection (has no administration in time range)  ziprasidone (GEODON) injection 10 mg (has no administration in time range)     Initial Impression / Assessment and Plan / ED Course  I have reviewed the triage vital signs and the nursing notes.  Pertinent labs & imaging results that were available during my care of the patient were reviewed by me and considered in my medical decision making (see chart for details).  Will screen with labs and get x-ray of the patient's wrist.  IVC paperwork has been filed.  Will have TTS consult.  Final Clinical Impressions(s) / ED Diagnoses   Final diagnoses:  Homicidal ideation    ED Discharge Orders    None       Julianne Rice, MD 10/06/18 [phone]

## 2018-10-06 NOTE — ED Notes (Signed)
Bed: MOC69 Expected date:  Expected time:  Means of arrival:  Comments:

## 2018-10-06 NOTE — ED Notes (Signed)
Bed: EQ14 Expected date:  Expected time:  Means of arrival:  Comments: [employer] IVC
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
## 2018-10-07 DIAGNOSIS — F23 Brief psychotic disorder: Secondary | ICD-10-CM | POA: Diagnosis present

## 2018-10-07 MED ORDER — OLANZAPINE 10 MG PO TBDP
10.0000 mg | ORAL_TABLET | Freq: Once | ORAL | Status: AC
  Administered 2018-10-07: 10 mg via ORAL
  Filled 2018-10-07: qty 1

## 2018-10-07 MED ORDER — LORAZEPAM 2 MG/ML IJ SOLN
2.0000 mg | Freq: Once | INTRAMUSCULAR | Status: AC | PRN
  Administered 2018-10-07: 2 mg via INTRAMUSCULAR
  Filled 2018-10-07: qty 1

## 2018-10-07 MED ORDER — STERILE WATER FOR INJECTION IJ SOLN
INTRAMUSCULAR | Status: AC
  Filled 2018-10-07: qty 10

## 2018-10-07 MED ORDER — DIPHENHYDRAMINE HCL 50 MG/ML IJ SOLN
50.0000 mg | Freq: Once | INTRAMUSCULAR | Status: AC | PRN
  Administered 2018-10-07: 50 mg via INTRAMUSCULAR
  Filled 2018-10-07: qty 1

## 2018-10-07 MED ORDER — OLANZAPINE 5 MG PO TBDP
5.0000 mg | ORAL_TABLET | Freq: Once | ORAL | Status: AC
  Administered 2018-10-07: 5 mg via ORAL
  Filled 2018-10-07: qty 1

## 2018-10-07 MED ORDER — ACETAMINOPHEN 325 MG PO TABS
650.0000 mg | ORAL_TABLET | Freq: Once | ORAL | Status: AC
  Administered 2018-10-07: 650 mg via ORAL
  Filled 2018-10-07: qty 2

## 2018-10-07 MED ORDER — ZIPRASIDONE MESYLATE 20 MG IM SOLR
10.0000 mg | Freq: Once | INTRAMUSCULAR | Status: AC | PRN
  Administered 2018-10-07: 10 mg via INTRAMUSCULAR
  Filled 2018-10-07: qty 20
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
## 2018-10-07 NOTE — BH Assessment (Signed)
[facility] Assessment Progress Note  Per Hampton Abbot, MD, this pt requires psychiatric hospitalization.  Letitia Libra, RN, [facility] has assigned pt to [facility] Rm 307-1; [facility] will be ready to receive pt at 21:00.  Pt presents under IVC initiated by Dr Margaret Pyle, and upheld by Dr Dwyane Dee, and IVC documents have been faxed to [facility].  Pt's nurse, Nena Jordan, has been notified, and agrees to call report to [phone].  Pt is to be transported via [employer].   Jalene Mullet, [place] Coordinator [phone]

## 2018-10-07 NOTE — ED Notes (Signed)
Pt is very loud.  She is disorganized and tangential.  She is disrupting the unit.  She will not listen to reason and will not be redirected.

## 2018-10-07 NOTE — ED Notes (Signed)
Tylenol given for complaint of neck pain along with a hot pack. Encouraged her to lie down with hot pack after getting Tylenol and earlier the Zyprexa and relax. She is tearful again, ruminating over all the mean things that have happened to her including her being here. No noted benefit from Zyprexa.

## 2018-10-07 NOTE — ED Notes (Signed)
Called Dr for something to help her calm down. For an hour she has been crying, rubbing her hands together repeatedly which she says is massaging her hands. Alternate methods tried to calm her, allowed her to shower, made her hot tea, warm blanket, magazines for distraction but she continues to rock and whine loudly in her bed and remains tearful. Order for Zyprexa Zydis given and she took it with some coaxing. She said she did not want anything for sleep. Sat with her an listened to her tell stories, most negative in nature and all involved dates, dates of births, deaths, anniversaries, etc. Continues to cry at times and explains she cries for 7 min every night, but she has been crying far more than 7 min, also says she wakes up at 3 every day because that is the time her mother died. Remains tangential and anxious. Will continue to monitor.

## 2018-10-07 NOTE — ED Notes (Signed)
Pt sleeping, will attempt to assess at a later time.

## 2018-10-07 NOTE — ED Notes (Signed)
Per [facility] room will not be available until 0800.

## 2018-10-07 NOTE — ED Notes (Signed)
Pt refuses to answer any questions. Pt shakes her head when asked a question. Pt laying in bed quietly.

## 2018-10-07 NOTE — ED Notes (Signed)
Quiet,lying in bed, appears to be asleep.
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
## 2018-10-08 ENCOUNTER — Other Ambulatory Visit: Payer: Self-pay

## 2018-10-08 ENCOUNTER — Inpatient Hospital Stay (HOSPITAL_COMMUNITY)
Admission: AD | Admit: 2018-10-08 | Discharge: 2018-10-14 | DRG: 885 | Disposition: A | Discharge status: Home / Self Care | Payer: BLUE CROSS/BLUE SHIELD | Attending: Psychiatry | Admitting: Psychiatry

## 2018-10-08 ENCOUNTER — Encounter (HOSPITAL_COMMUNITY): Payer: Self-pay

## 2018-10-08 DIAGNOSIS — F3110 Bipolar disorder, current episode manic without psychotic features, unspecified: Secondary | ICD-10-CM

## 2018-10-08 DIAGNOSIS — F3112 Bipolar disorder, current episode manic without psychotic features, moderate: Secondary | ICD-10-CM

## 2018-10-08 DIAGNOSIS — Z950 Presence of cardiac pacemaker: Secondary | ICD-10-CM

## 2018-10-08 DIAGNOSIS — F3160 Bipolar disorder, current episode mixed, unspecified: Secondary | ICD-10-CM | POA: Diagnosis present

## 2018-10-08 DIAGNOSIS — I10 Essential (primary) hypertension: Secondary | ICD-10-CM | POA: Diagnosis present

## 2018-10-08 DIAGNOSIS — E079 Disorder of thyroid, unspecified: Secondary | ICD-10-CM | POA: Diagnosis present

## 2018-10-08 DIAGNOSIS — F419 Anxiety disorder, unspecified: Secondary | ICD-10-CM | POA: Diagnosis present

## 2018-10-08 DIAGNOSIS — I429 Cardiomyopathy, unspecified: Secondary | ICD-10-CM | POA: Diagnosis present

## 2018-10-08 DIAGNOSIS — Z833 Family history of diabetes mellitus: Secondary | ICD-10-CM

## 2018-10-08 DIAGNOSIS — R45850 Homicidal ideations: Secondary | ICD-10-CM | POA: Diagnosis present

## 2018-10-08 DIAGNOSIS — Z803 Family history of malignant neoplasm of breast: Secondary | ICD-10-CM

## 2018-10-08 DIAGNOSIS — F312 Bipolar disorder, current episode manic severe with psychotic features: Secondary | ICD-10-CM

## 2018-10-08 DIAGNOSIS — Z888 Allergy status to other drugs, medicaments and biological substances status: Secondary | ICD-10-CM

## 2018-10-08 DIAGNOSIS — Z79899 Other long term (current) drug therapy: Secondary | ICD-10-CM

## 2018-10-08 MED ORDER — ALUM & MAG HYDROXIDE-SIMETH 200-200-20 MG/5ML PO SUSP: 30.0000 mL | ORAL | Status: DC | PRN

## 2018-10-08 MED ORDER — MAGNESIUM HYDROXIDE 400 MG/5ML PO SUSP: 30.0000 mL | Freq: Every day | ORAL | Status: DC | PRN

## 2018-10-08 MED ORDER — RISPERIDONE 1 MG PO TBDP
1.0000 mg | ORAL_TABLET | Freq: Every day | ORAL | Status: DC
  Filled 2018-10-08 (×4): qty 1

## 2018-10-08 MED ORDER — HYDROXYZINE HCL 25 MG PO TABS: 25.0000 mg | ORAL_TABLET | Freq: Three times a day (TID) | ORAL | Status: DC | PRN

## 2018-10-08 MED ORDER — ALPRAZOLAM 0.5 MG PO TABS
0.5000 mg | ORAL_TABLET | Freq: Three times a day (TID) | ORAL | Status: DC
  Administered 2018-10-08: 0.5 mg via ORAL
  Filled 2018-10-08: qty 1

## 2018-10-08 MED ORDER — RISPERIDONE 0.5 MG PO TBDP
0.5000 mg | ORAL_TABLET | Freq: Two times a day (BID) | ORAL | Status: DC
  Filled 2018-10-08 (×2): qty 1

## 2018-10-08 MED ORDER — RISPERIDONE 0.5 MG PO TBDP
0.5000 mg | ORAL_TABLET | ORAL | Status: DC
  Filled 2018-10-08 (×3): qty 1

## 2018-10-08 MED ORDER — LORAZEPAM 1 MG PO TABS: 1.0000 mg | ORAL_TABLET | Freq: Two times a day (BID) | ORAL | Status: DC

## 2018-10-08 MED ORDER — TRAZODONE HCL 50 MG PO TABS: 50.0000 mg | ORAL_TABLET | Freq: Every evening | ORAL | Status: DC | PRN

## 2018-10-08 MED ORDER — LORAZEPAM 0.5 MG PO TABS
0.5000 mg | ORAL_TABLET | Freq: Four times a day (QID) | ORAL | Status: DC | PRN
  Administered 2018-10-11: 0.5 mg via ORAL
  Filled 2018-10-08: qty 1

## 2018-10-08 MED ORDER — ACETAMINOPHEN 325 MG PO TABS
650.0000 mg | ORAL_TABLET | Freq: Four times a day (QID) | ORAL | Status: DC | PRN
  Administered 2018-10-09 – 2018-10-11 (×3): 650 mg via ORAL
  Filled 2018-10-08 (×3): qty 2

## 2018-10-08 MED ORDER — INFLUENZA VAC SPLIT QUAD 0.5 ML IM SUSY
0.5000 mL | PREFILLED_SYRINGE | INTRAMUSCULAR | Status: DC
  Filled 2018-10-08: qty 0.5

## 2018-10-08 MED ORDER — ACETAMINOPHEN 325 MG PO TABS
650.0000 mg | ORAL_TABLET | Freq: Four times a day (QID) | ORAL | Status: DC | PRN
  Administered 2018-10-08: 650 mg via ORAL
  Filled 2018-10-08: qty 2

## 2018-10-08 MED ORDER — RISPERIDONE 0.5 MG PO TABS
0.5000 mg | ORAL_TABLET | Freq: Every day | ORAL | Status: DC
  Filled 2018-10-08 (×2): qty 1

## 2018-10-08 MED ORDER — LORAZEPAM 0.5 MG PO TABS
0.5000 mg | ORAL_TABLET | Freq: Three times a day (TID) | ORAL | Status: DC
  Administered 2018-10-08 – 2018-10-14 (×15): 0.5 mg via ORAL
  Filled 2018-10-08 (×16): qty 1
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
## 2018-10-08 NOTE — ED Notes (Signed)
Report given to [place] at [facility]. [employer] called for transport.
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
## 2018-10-09 DIAGNOSIS — F3110 Bipolar disorder, current episode manic without psychotic features, unspecified: Secondary | ICD-10-CM | POA: Diagnosis not present

## 2018-10-09 LAB — TSH: TSH: 4.176 u[IU]/mL (ref 0.350–4.500)

## 2018-10-09 LAB — LIPID PANEL
Cholesterol: 199 mg/dL (ref 0–200)
HDL: 57 mg/dL (ref >40)
LDL CALC: 118 mg/dL — AB (ref 0–99)
Total CHOL/HDL Ratio: 3.5 RATIO
Triglycerides: 121 mg/dL (ref <150)
VLDL: 24 mg/dL (ref 0–40)

## 2018-10-09 LAB — HEMOGLOBIN A1C
Hgb A1c MFr Bld: 5.9 % — ABNORMAL HIGH (ref 4.8–5.6)
Mean Plasma Glucose: 122.63 mg/dL

## 2018-10-09 MED ORDER — PNEUMOCOCCAL VAC POLYVALENT 25 MCG/0.5ML IJ INJ: 0.5000 mL | INJECTION | INTRAMUSCULAR | Status: DC

## 2018-10-09 MED ORDER — CARVEDILOL 25 MG PO TABS
25.0000 mg | ORAL_TABLET | Freq: Two times a day (BID) | ORAL | Status: DC
  Administered 2018-10-09 – 2018-10-11 (×5): 25 mg via ORAL
  Filled 2018-10-09: qty 2
  Filled 2018-10-09 (×7): qty 1

## 2018-10-09 MED ORDER — HYDROCHLOROTHIAZIDE 12.5 MG PO CAPS
12.5000 mg | ORAL_CAPSULE | Freq: Every day | ORAL | Status: DC
  Administered 2018-10-09 – 2018-10-14 (×5): 12.5 mg via ORAL
  Filled 2018-10-09 (×8): qty 1

## 2018-10-09 MED ORDER — LISINOPRIL 20 MG PO TABS
20.0000 mg | ORAL_TABLET | Freq: Every day | ORAL | Status: DC
  Administered 2018-10-09 – 2018-10-14 (×5): 20 mg via ORAL
  Filled 2018-10-09 (×8): qty 1

## 2018-10-09 MED ORDER — CARVEDILOL 25 MG PO TABS
25.0000 mg | ORAL_TABLET | Freq: Two times a day (BID) | ORAL | Status: DC
  Filled 2018-10-09 (×2): qty 1

## 2018-10-09 NOTE — BHH Group Notes (Signed)
[place] [facility] LCSW Group Therapy Note  Date/Time:  10/09/2018  11:00AM-12:00PM  Type of Therapy and Topic:  Group Therapy:  Music and Mood  Participation Level:  Minimal   Description of Group: In this process group, members listened to a variety of genres of music and identified that different types of music evoke different responses.  Patients were encouraged to identify music that was soothing for them and music that was energizing for them.  Patients discussed how this knowledge can help with wellness and recovery in various ways including managing depression and anxiety as well as encouraging healthy sleep habits.    Therapeutic Goals: 1. Patients will explore the impact of different varieties of music on mood 2. Patients will verbalize the thoughts they have when listening to different types of music 3. Patients will identify music that is soothing to them as well as music that is energizing to them 4. Patients will discuss how to use this knowledge to assist in maintaining wellness and recovery 5. Patients will explore the use of music as a coping skill  Summary of Patient Progress:  At the beginning of group, patient expressed that she felt "happy" and she did not interact at all during group, kept her back turned and looked outside.  At the end of group she said that she feels "like diving with the sharks and I hate [place] men and women."  Therapeutic Modalities: Solution Focused Brief Therapy Activity   Selmer Dominion, LCSW

## 2018-10-09 NOTE — Progress Notes (Addendum)
[facility] MD Progress Note  10/09/2018 9:07 AM Nancy Drake  MRN:  [id number] Subjective:  "I'm fine. I'm getting some rest."  Nancy Drake found resting in bed. She refused Risperdal last night. She continues to present with pressured speech, disorganized thinking, poor insight. When asked about Risperdal, she states "I only needed that years ago when I had postpartum depression but then my daughter saved me. We save neighbors. There's no point in medicine when I need a pacemaker. I have saved 5 marriages. I'm a good person."  Denies hx of side effects on Risperdal but states she does not need medication. Alternative medication suggestions were offered but patient declined any medication other than Ativan. She is calm initially but becomes agitated when encouraged to take Risperdal or other antipsychotic/mood stabilizers. Calms down with further discussion but still denies need for medication. Denies SI, HI, AVH. Denies problems with sleep. Per nursing report she became agitated this morning when blood pressure meds were not available but this was resolved and patient received bp meds. No disruptive behaviors on the unit.  Principal Problem: Bipolar I disorder, most recent episode (or current) manic ([place]) Diagnosis: Principal Problem:   Bipolar I disorder, most recent episode (or current) manic ([place]) Active Problems:   Bipolar affective disorder, current episode mixed ([place])   Bipolar I disorder, most recent episode (or current) manic, moderate ([place])  Total Time spent with patient: 15 minutes  Past Psychiatric History: See admission H&P  Past Medical History:  Past Medical History:  Diagnosis Date  . Anxiety    lost a child , sees psych  . Biventricular cardiac pacemaker -[employer]    DOI 2013  . Cardiomyopathy nonischemic    dx after a syncope 10-2010  . Complete heart block ([place]) 03-2012  . Mole of skin    bx 05-2013  . Thyroid disease    used to see Dr Debbora Presto    Past Surgical  History:  Procedure Laterality Date  . BI-VENTRICULAR PACEMAKER INSERTION N/A 04/11/2012   Procedure: BI-VENTRICULAR PACEMAKER INSERTION (CRT-P);  Surgeon: Deboraha Sprang, MD;  Location: [facility] CATH LAB;  Service: Cardiovascular;  Laterality: N/A;  . CESAREAN SECTION    . COLONOSCOPY     2010  . LEAD REVISION N/A 04/12/2012   Procedure: LEAD REVISION;  Surgeon: Evans Lance, MD;  Location: [facility] CATH LAB;  Service: Cardiovascular;  Laterality: N/A;  . PACEMAKER INSERTION  03-2012  . TEMPORARY PACEMAKER INSERTION Right 04/08/2012   Procedure: TEMPORARY PACEMAKER INSERTION;  Surgeon: Burnell Blanks, MD;  Location: [facility] CATH LAB;  Service: Cardiovascular;  Laterality: Right;   Family History:  Family History  Problem Relation Age of Onset  . Other Other        uncle with pacemaker  . Heart disease Other        PACERMAKER  . Breast cancer Other        cousin  . Diabetes Mother   . Cancer Father        prostate cancer  . Colon cancer Neg Hx    Family Psychiatric  History: See admission H&P Social History:  Social History   Substance and Sexual Activity  Alcohol Use Yes   Comment: socially      Social History   Substance and Sexual Activity  Drug Use No    Social History   Socioeconomic History  . Marital status: Divorced    Spouse name: Not on file  . Number of children: 2  . Years  of education: Not on file  . Highest education level: Not on file  Occupational History  . Occupation: not working at present    Employer: [employer]  . Financial resource strain: Not on file  . Food insecurity:    Worry: Not on file    Inability: Not on file  . Transportation needs:    Medical: Not on file    Non-medical: Not on file  Tobacco Use  . Smoking status: Never Smoker  . Smokeless tobacco: Never Used  Substance and Sexual Activity  . Alcohol use: Yes    Comment: socially   . Drug use: No  . Sexual activity: Yes    Birth control/protection: Post-menopausal   Lifestyle  . Physical activity:    Days per week: Not on file    Minutes per session: Not on file  . Stress: Not on file  Relationships  . Social connections:    Talks on phone: Not on file    Gets together: Not on file    Attends religious service: Not on file    Active member of club or organization: Not on file    Attends meetings of clubs or organizations: Not on file    Relationship status: Not on file  Other Topics Concern  . Not on file  Social History Narrative   Born in [place], lives w/ father    2 child, lost one    Additional Social History:                         Sleep: Fair  Appetite:  Good  Current Medications: Current Facility-Administered Medications  Medication Dose Route Frequency Provider Last Rate Last Dose  . acetaminophen (TYLENOL) tablet 650 mg  650 mg Oral Q6H PRN [name], NP   650 mg at 10/09/18 0243  . alum & mag hydroxide-simeth (MAALOX/MYLANTA) 200-200-20 MG/5ML suspension 30 mL  30 mL Oral Q4H PRN [name], NP      . Influenza vac split quadrivalent PF (FLUARIX) injection 0.5 mL  0.5 mL Intramuscular Tomorrow-1000 ,  A, MD      . LORazepam (ATIVAN) tablet 0.5 mg  0.5 mg Oral TID , [name], MD   0.5 mg at 10/09/18 0753  . LORazepam (ATIVAN) tablet 0.5 mg  0.5 mg Oral Q6H PRN , [name], MD      . magnesium hydroxide (MILK OF MAGNESIA) suspension 30 mL  30 mL Oral Daily PRN [name], NP      . Derrill Memo ON 10/10/2018] pneumococcal 23 valent vaccine (PNU-IMMUNE) injection 0.5 mL  0.5 mL Intramuscular Tomorrow-1000 [name], MD      . risperiDONE (RISPERDAL M-TABS) disintegrating tablet 0.5 mg  0.5 mg Oral BH-q7a , [name], MD      . risperiDONE (RISPERDAL M-TABS) disintegrating tablet 1 mg  1 mg Oral QHS , [name], MD      . traZODone (DESYREL) tablet 50 mg  50 mg Oral QHS PRN [name], NP        Lab Results: No results found for this or any  previous visit (from the past 74 hour(s)).  Blood Alcohol level:  Lab Results  Component Value Date   [facility] <10 10/06/2018   ETH <11 85/10/7739    Metabolic Disorder Labs: Lab Results  Component Value Date   HGBA1C 5.9 06/29/2013   MPG 117 (H) 04/08/2012   No results found for: PROLACTIN  Lab Results  Component Value Date   CHOL 195 06/29/2013   TRIG 62.0 06/29/2013   HDL 59.20 06/29/2013   CHOLHDL 3 06/29/2013   VLDL 12.4 06/29/2013   LDLCALC 123 (H) 06/29/2013   LDLCALC 115 (H) 06/08/2012    Physical Findings: AIMS: Facial and Oral Movements Muscles of Facial Expression: None, normal Lips and Perioral Area: None, normal Jaw: None, normal Tongue: None, normal,Extremity Movements Upper (arms, wrists, hands, fingers): None, normal Lower (legs, knees, ankles, toes): None, normal, Trunk Movements Neck, shoulders, hips: None, normal, Overall Severity Severity of abnormal movements (highest score from questions above): None, normal Incapacitation due to abnormal movements: None, normal Patient's awareness of abnormal movements (rate only patient's report): No Awareness, Dental Status Current problems with teeth and/or dentures?: No Does patient usually wear dentures?: No  CIWA:    COWS:     Musculoskeletal: Strength & Muscle Tone: within normal limits Gait & Station: normal Patient leans: N/A  Psychiatric Specialty Exam: Physical Exam  Nursing note and vitals reviewed. Constitutional: She is oriented to person, place, and time. She appears well-developed and well-nourished.  Cardiovascular: Normal rate.  Respiratory: Effort normal.  Neurological: She is alert and oriented to person, place, and time.    Review of Systems  Constitutional: Negative.   Respiratory: Negative.   Cardiovascular: Negative.   Psychiatric/Behavioral: Negative for depression, hallucinations, substance abuse and suicidal ideas. The patient is not nervous/anxious and does not have insomnia.      Blood pressure (!) 158/109, pulse 68, temperature 98.3 F (36.8 C), temperature source Oral, resp. rate 18, height 5\' 2"  (1.575 m), weight 57.2 kg, SpO2 100 %.Body mass index is 23.05 kg/m.  General Appearance: Fairly Groomed  Eye Contact:  Fair  Speech:  Pressured  Volume:  Normal  Mood:  Irritable  Affect:  Labile  Thought Process:  Disorganized  Orientation:  Full (Time, Place, and Person)  Thought Content:  Tangential  Suicidal Thoughts:  No  Homicidal Thoughts:  No  Memory:  Immediate;   Fair Recent;   Fair  Judgement:  Impaired  Insight:  Lacking  Psychomotor Activity:  Normal  Concentration:  Concentration: Fair  Recall:  [employer] of Knowledge:  Fair  Language:  Good  Akathisia:  No  Handed:  Right  AIMS (if indicated):     Assets:  Housing Resilience Social Support  ADL's:  Intact  Cognition:  WNL  Sleep:  Number of Hours: 0.5     Treatment Plan Summary: Daily contact with patient to assess and evaluate symptoms and progress in treatment and Medication management   Continue inpatient hospitalization. Continue to encourage med compliance.  Continue Risperdal 1 mg PO BID for psychosis Continue Ativan 0.5 mg PO TID for anxiety Continue lisinopril 20 mg PO daily for HTN Continue HCTZ 12.5 mg PO daily for HTN Continue Coreg 25 mg PO BID for HTN Continue trazodone 50 mg PO QHS PRN insomnia  Patient will participate in the therapeutic group milieu.  Discharge disposition in progress.   Connye Burkitt, NP 10/09/2018, 9:07 AM   Agree with NP progress note
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
## 2018-10-09 NOTE — Progress Notes (Signed)
Patient pleased her BP medications started. She reports that she wants to go back to [facility] where they treat her right. Safety maintained on unit with 15 min checks.
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
## 2018-10-10 DIAGNOSIS — F3110 Bipolar disorder, current episode manic without psychotic features, unspecified: Secondary | ICD-10-CM | POA: Diagnosis not present

## 2018-10-10 MED ORDER — RISPERIDONE 1 MG PO TBDP
3.0000 mg | ORAL_TABLET | Freq: Every day | ORAL | Status: DC
  Filled 2018-10-10: qty 3

## 2018-10-10 MED ORDER — CARBAMAZEPINE 200 MG PO TABS
200.0000 mg | ORAL_TABLET | Freq: Two times a day (BID) | ORAL | Status: DC
  Filled 2018-10-10 (×6): qty 1

## 2018-10-10 MED ORDER — RISPERIDONE 1 MG PO TBDP
3.0000 mg | ORAL_TABLET | Freq: Two times a day (BID) | ORAL | Status: DC
  Administered 2018-10-11 – 2018-10-14 (×7): 3 mg via ORAL
  Filled 2018-10-10 (×11): qty 3

## 2018-10-10 NOTE — BHH Group Notes (Signed)
[facility] LCSW Group Therapy Note  Date/Time: 10/10/18, 1315  Type of Therapy and Topic:  Group Therapy:  Overcoming Obstacles  Participation Level:  Did not attend  Description of Group:    In this group patients will be encouraged to explore what they see as obstacles to their own wellness and recovery. They will be guided to discuss their thoughts, feelings, and behaviors related to these obstacles. The group will process together ways to cope with barriers, with attention given to specific choices patients can make. Each patient will be challenged to identify changes they are motivated to make in order to overcome their obstacles. This group will be process-oriented, with patients participating in exploration of their own experiences as well as giving and receiving support and challenge from other group members.  Therapeutic Goals: 1. Patient will identify personal and current obstacles as they relate to admission. 2. Patient will identify barriers that currently interfere with their wellness or overcoming obstacles.  3. Patient will identify feelings, thought process and behaviors related to these barriers. 4. Patient will identify two changes they are willing to make to overcome these obstacles:    Summary of Patient Progress      Therapeutic Modalities:   Cognitive Behavioral Therapy Solution Focused Therapy Motivational Interviewing Relapse Prevention Therapy  Lurline Idol, LCSW

## 2018-10-10 NOTE — Tx Team (Signed)
Interdisciplinary Treatment and Diagnostic Plan Update  10/10/2018 Time of Session: [place] MRN: [id number]  Principal Diagnosis: Bipolar I disorder, most recent episode (or current) manic ([place])  Secondary Diagnoses: Principal Problem:   Bipolar I disorder, most recent episode (or current) manic ([place]) Active Problems:   Bipolar affective disorder, current episode mixed ([place])   Bipolar I disorder, most recent episode (or current) manic, moderate ([place])   Current Medications:  Current Facility-Administered Medications  Medication Dose Route Frequency Provider Last Rate Last Dose  . acetaminophen (TYLENOL) tablet 650 mg  650 mg Oral Q6H PRN [name], NP   650 mg at 10/10/18 0403  . alum & mag hydroxide-simeth (MAALOX/MYLANTA) 200-200-20 MG/5ML suspension 30 mL  30 mL Oral Q4H PRN [name], NP      . carbamazepine (TEGRETOL) tablet 200 mg  200 mg Oral BID [name], MD      . carvedilol (COREG) tablet 25 mg  25 mg Oral BID WC [name], MD   25 mg at 10/10/18 0636  . hydrochlorothiazide (MICROZIDE) capsule 12.5 mg  12.5 mg Oral Daily [name], [name], MD   12.5 mg at 10/10/18 9622  . Influenza vac split quadrivalent PF (FLUARIX) injection 0.5 mL  0.5 mL Intramuscular Tomorrow-1000 [name], [name], MD      . lisinopril (PRINIVIL,ZESTRIL) tablet 20 mg  20 mg Oral Daily [name], [name], MD   20 mg at 10/10/18 2979  . LORazepam (ATIVAN) tablet 0.5 mg  0.5 mg Oral TID [name], [name], MD   0.5 mg at 10/10/18 0810  . LORazepam (ATIVAN) tablet 0.5 mg  0.5 mg Oral Q6H PRN [name], [name], MD      . magnesium hydroxide (MILK OF MAGNESIA) suspension 30 mL  30 mL Oral Daily PRN [name], NP      . risperiDONE (RISPERDAL M-TABS) disintegrating tablet 3 mg  3 mg Oral BID [name], MD      . traZODone (DESYREL) tablet 50 mg  50 mg Oral QHS PRN [name], NP       PTA Medications: Medications Prior to Admission   Medication Sig Dispense Refill Last Dose  . ALPRAZolam (XANAX) 0.5 MG tablet Take 1 tablet by mouth 3 (three) times daily as needed for anxiety.   3 Past Week at Unknown time  . carvedilol (COREG) 25 MG tablet TAKE 1 TABLET(25 MG) BY MOUTH TWICE DAILY WITH A MEAL (Patient taking differently: Take 25 mg by mouth 2 (two) times daily with a meal. TAKE 1 TABLET(25 MG) BY MOUTH TWICE DAILY WITH A MEAL) 60 tablet 3 10/06/2018 at Unknown time  . lisinopril (PRINIVIL,ZESTRIL) 10 MG tablet TAKE 2 TABLETS BY MOUTH TWICE DAILY (Patient not taking: Reported on 10/07/2018) 360 tablet 1 Not Taking at Unknown time  . lisinopril-hydrochlorothiazide (PRINZIDE,ZESTORETIC) 20-12.5 MG tablet Take 2 tablets by mouth daily.   10/06/2018 at Unknown time  . risperiDONE (RISPERDAL) 0.5 MG tablet Take 1 tablet (0.5 mg total) by mouth at bedtime. (Patient not taking: Reported on 10/07/2018) 30 tablet 9 Not Taking at Unknown time    Patient Stressors: Financial difficulties Health problems Legal issue  Patient Strengths: Ability for insight Average or above average intelligence Communication skills  Treatment Modalities: Medication Management, Group therapy, Case management,  1 to 1 session with clinician, Psychoeducation, Recreational therapy.   Physician Treatment Plan for Primary Diagnosis: Bipolar I disorder, most recent episode (or current) manic ([place]) Long Term Goal(s): Improvement in  symptoms so as ready for discharge Improvement in symptoms so as ready for discharge   Short Term Goals: Ability to demonstrate self-control will improve Compliance with prescribed medications will improve Ability to identify changes in lifestyle to reduce recurrence of condition will improve Ability to identify and develop effective coping behaviors will improve  Medication Management: Evaluate patient's response, side effects, and tolerance of medication regimen.  Therapeutic Interventions: 1 to 1 sessions, Unit Group sessions and  Medication administration.  Evaluation of Outcomes: Not Met  Physician Treatment Plan for Secondary Diagnosis: Principal Problem:   Bipolar I disorder, most recent episode (or current) manic ([place]) Active Problems:   Bipolar affective disorder, current episode mixed ([place])   Bipolar I disorder, most recent episode (or current) manic, moderate ([place])  Long Term Goal(s): Improvement in symptoms so as ready for discharge Improvement in symptoms so as ready for discharge   Short Term Goals: Ability to demonstrate self-control will improve Compliance with prescribed medications will improve Ability to identify changes in lifestyle to reduce recurrence of condition will improve Ability to identify and develop effective coping behaviors will improve     Medication Management: Evaluate patient's response, side effects, and tolerance of medication regimen.  Therapeutic Interventions: 1 to 1 sessions, Unit Group sessions and Medication administration.  Evaluation of Outcomes: Not Met   RN Treatment Plan for Primary Diagnosis: Bipolar I disorder, most recent episode (or current) manic ([place]) Long Term Goal(s): Knowledge of disease and therapeutic regimen to maintain health will improve  Short Term Goals: Ability to identify and develop effective coping behaviors will improve and Compliance with prescribed medications will improve  Medication Management: RN will administer medications as ordered by provider, will assess and evaluate patient's response and provide education to patient for prescribed medication. RN will report any adverse and/or side effects to prescribing provider.  Therapeutic Interventions: 1 on 1 counseling sessions, Psychoeducation, Medication administration, Evaluate responses to treatment, Monitor vital signs and CBGs as ordered, Perform/monitor CIWA, COWS, AIMS and Fall Risk screenings as ordered, Perform wound care treatments as ordered.  Evaluation of Outcomes: Not  Met   LCSW Treatment Plan for Primary Diagnosis: Bipolar I disorder, most recent episode (or current) manic ([place]) Long Term Goal(s): Safe transition to appropriate next level of care at discharge, Engage patient in therapeutic group addressing interpersonal concerns.  Short Term Goals: Engage patient in aftercare planning with referrals and resources, Identify triggers associated with mental health/substance abuse issues and Increase skills for wellness and recovery  Therapeutic Interventions: Assess for all discharge needs, 1 to 1 time with Social worker, Explore available resources and support systems, Assess for adequacy in community support network, Educate family and significant other(s) on suicide prevention, Complete Psychosocial Assessment, Interpersonal group therapy.  Evaluation of Outcomes: Not Met   Progress in Treatment: Attending groups: Yes. Participating in groups: Yes. Taking medication as prescribed: Yes. Toleration medication: Yes. Family/Significant other contact made: No, will contact:  sister Patient understands diagnosis: No. Discussing patient identified problems/goals with staff: Yes. Medical problems stabilized or resolved: Yes. Denies suicidal/homicidal ideation: Yes. Issues/concerns per patient self-inventory: No. Other: none  New problem(s) identified: No, Describe:  none  New Short Term/Long Term Goal(s):  Patient Goals:  "discharge"  Discharge Plan or Barriers:   Reason for Continuation of Hospitalization: Mania Medication stabilization  Estimated Length of Stay: 3-5 days.  Attendees: Patient: Nancy Drake 10/10/2018   Physician: Dr. Jake Samples, MD 10/10/2018   Nursing: [place] Police, RN 10/10/2018   RN Care Manager: 10/10/2018  Social Worker: Lurline Idol, LCSW 10/10/2018   Recreational Therapist:  10/10/2018   Other:  10/10/2018   Other:  10/10/2018   Other: 10/10/2018     Scribe for Treatment Team: [name],  [place] 10/10/2018 9:58 AM
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
## 2018-10-10 NOTE — Progress Notes (Signed)
Writer notified Lindon Romp NP about patient's elevated blood pressure and received a verbal order to administer early before breakfast. Patient was not dizzy nor light-headed. BP- 179/92, pulse 67. Will report this to oncoming nurse.

## 2018-10-10 NOTE — Progress Notes (Signed)
Recreation Therapy Notes  Date: 2.10.20 Time:  1000 Location: 500 Hall Dayroom    Group Topic: Coping Skills  Goal Area(s) Addresses:  Patient will identify positive coping skills. Patient will identify benefits of using coping skills.  Intervention: Worksheet, pencils, white board, marker, eraser  Activity: Mind map.  LRT and patients filled in the first eight boxes with anxiety, anger, depression, finances, disrespect, death, loss of employment and relationships.  Patients were then given time to come up with at least 3 coping skills for each trigger identified.  Group would reconvene and LRT wrote the coping skills on the board.  Education: Radiographer, therapeutic, Dentist.   Education Outcome: Acknowledges understanding/In group clarification offered/Needs additional education.   Clinical Observations/Feedback: Pt did not attend group.    Victorino Sparrow, LRT/CTRS        Victorino Sparrow A 10/10/2018 11:35 AM

## 2018-10-11 ENCOUNTER — Other Ambulatory Visit: Payer: Self-pay

## 2018-10-11 DIAGNOSIS — F3110 Bipolar disorder, current episode manic without psychotic features, unspecified: Secondary | ICD-10-CM | POA: Diagnosis not present

## 2018-10-11 MED ORDER — CARVEDILOL 25 MG PO TABS
50.0000 mg | ORAL_TABLET | Freq: Two times a day (BID) | ORAL | Status: DC
  Administered 2018-10-11 – 2018-10-14 (×6): 50 mg via ORAL
  Filled 2018-10-11 (×8): qty 2

## 2018-10-11 MED ORDER — ARIPIPRAZOLE 2 MG PO TABS
2.0000 mg | ORAL_TABLET | Freq: Once | ORAL | Status: DC
  Filled 2018-10-11: qty 1

## 2018-10-11 NOTE — Progress Notes (Signed)
Recreation Therapy Notes  Date: 2.11.20 Time: 1000 Location: 500 Hall Dayroom  Group Topic:  Goal Setting  Goal Area(s) Addresses:  Patient will be able to identify at least 3 life goals.  Patient will be able to identify benefit of investing in life goals.  Patient will be able to identify benefit of setting life goals.   Intervention: Worksheet, pencils  Activity: Goal Planning.  Patients were to identify goals they want to accomplish in a week, a month, in 1 yr and in 5 yrs.  Patients were to then identify obstacles to achieving their goals, what they need to accomplish their goals and what they can start doing now to reach their goals.  Education:  Discharge Planning, Coping Skills, Life Goals   Education Outcome: Acknowledges Education/In Group Clarification Provided/Needs Additional Education  Clinical Observations: Pt did not attend group.      Victorino Sparrow, LRT/CTRS         Victorino Sparrow A 10/11/2018 11:42 AM

## 2018-10-11 NOTE — Plan of Care (Signed)
D: Pt denies SI/HI/AVH. Pt continues to have delusional thoughts. Pt stated she was doing ok. Pt visible on the unit with multiple stories , flight of ideas and grandiose statements.   A: Pt was offered support and encouragement.  Pt was encourage to attend groups. Q 15 minute checks were done for safety.   R:Pt attends groups and interacts well with peers and staff. Pt is taking certain medications.  Pt has no complaints. safety maintained on unit.  Problem: Activity: Goal: Interest or engagement in leisure activities will improve Outcome: Progressing   Problem: Activity: Goal: Imbalance in normal sleep/wake cycle will improve Outcome: Progressing

## 2018-10-11 NOTE — Plan of Care (Signed)
Progress note  D: pt found in the hallway; compliant with all meds except for her Abilify. Pt denies si/hi/ah/vh and verbally agrees to approach staff if these become apparent or before harming herself/others while at [facility]. Pt is still hypervigilant and focused on her dead child, insurance, the war, ex husband, medications, and her heart condition. Pt denies any physical symptoms or pain.  A: pt provided support and encouragement. Pt given medication per protocol and standing orders. Q51m safety checks implemented and continued.  R: pt safe on the unit. Will continue to monitor.   Pt progressing in the following metrics  Problem: Coping: Goal: Ability to identify and develop effective coping behavior will improve Outcome: Progressing
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
## 2018-10-11 NOTE — Progress Notes (Signed)
Progress note  Pt is fixated on her health insurance. Pt states her claim lapsed in January and would like me to leave a note for the doctor so that he will know, and her "daughter is driving here from [place] this weekend with no health insurance".

## 2018-10-11 NOTE — Progress Notes (Signed)
D: Pt denies SI/HI/AV hallucinations. Pt has been in milieu and in halls interacting with staff. Patient refused to take medications states "I don't want to die." Patient states she is concern because she has heart problems. A: Pt was offered support and encouragement. Pt offered scheduled medications. Pt was encourage to attend groups. Q 15 minute checks were done for safety.  R:Pt attends groups and interacts well with staff. Pt is not taking medication. Pt has no complaints. Patient remains safe on unit.

## 2018-10-11 NOTE — Progress Notes (Signed)
[facility] MD Progress Note  10/11/2018 10:39 AM Nancy Drake  MRN:  [id number] Subjective:    Patient is only partially compliant she did however take Risperdal this morning, she is less hyperverbal her mania has indeed improved and it may indeed improve on Risperdal monotherapy but she is encouraged to take a mood stabilizer.  She adamantly refuses the Tegretol-she references the teaching sheet which has the 2 words heart problems on it is a possible side effect and she states because of her pacemaker she does not want to take it.  I explained to her that she is least likely to have a cardiac arrhythmia because her rhythm is paced and any arrhythmia will be compensated by her pacemaker at any rate she refuses the Tegretol after much discussion.   She clearly will need long-acting injectable so were going give her a test dose of aripiprazole   At present is alert oriented to person place situation less manic she is hypomanic at intervals she denies wanting to harm self or others denies hallucinations lacking insight still   Principal Problem: Bipolar I disorder, most recent episode (or current) manic ([place]) Diagnosis: Principal Problem:   Bipolar I disorder, most recent episode (or current) manic ([place]) Active Problems:   Bipolar affective disorder, current episode mixed ([place])   Bipolar I disorder, most recent episode (or current) manic, moderate ([place])  Total Time spent with patient: 20 minutes  Past Medical History:  Past Medical History:  Diagnosis Date  . Anxiety    lost a child , sees psych  . Biventricular cardiac pacemaker -Medtronic    DOI 2013  . Cardiomyopathy nonischemic    dx after a syncope 10-2010  . Complete heart block ([place]) 03-2012  . Mole of skin    bx 05-2013  . Thyroid disease    used to see Dr Debbora Presto    Past Surgical History:  Procedure Laterality Date  . BI-VENTRICULAR PACEMAKER INSERTION N/A 04/11/2012   Procedure: BI-VENTRICULAR PACEMAKER INSERTION (CRT-P);   Surgeon: Deboraha Sprang, MD;  Location: [facility] CATH LAB;  Service: Cardiovascular;  Laterality: N/A;  . CESAREAN SECTION    . COLONOSCOPY     2010  . LEAD REVISION N/A 04/12/2012   Procedure: LEAD REVISION;  Surgeon: Evans Lance, MD;  Location: [facility] CATH LAB;  Service: Cardiovascular;  Laterality: N/A;  . PACEMAKER INSERTION  03-2012  . TEMPORARY PACEMAKER INSERTION Right 04/08/2012   Procedure: TEMPORARY PACEMAKER INSERTION;  Surgeon: Burnell Blanks, MD;  Location: [facility] CATH LAB;  Service: Cardiovascular;  Laterality: Right;   Family History:  Family History  Problem Relation Age of Onset  . Other Other        uncle with pacemaker  . Heart disease Other        PACERMAKER  . Breast cancer Other        cousin  . Diabetes Mother   . Cancer Father        prostate cancer  . Colon cancer Neg Hx    Social History:  Social History   Substance and Sexual Activity  Alcohol Use Yes   Comment: socially      Social History   Substance and Sexual Activity  Drug Use No    Social History   Socioeconomic History  . Marital status: Divorced    Spouse name: Not on file  . Number of children: 2  . Years of education: Not on file  . Highest education level: Not on file  Occupational History  . Occupation: not working at present    Employer: [employer]  . Financial resource strain: Not on file  . Food insecurity:    Worry: Not on file    Inability: Not on file  . Transportation needs:    Medical: Not on file    Non-medical: Not on file  Tobacco Use  . Smoking status: Never Smoker  . Smokeless tobacco: Never Used  Substance and Sexual Activity  . Alcohol use: Yes    Comment: socially   . Drug use: No  . Sexual activity: Yes    Birth control/protection: Post-menopausal  Lifestyle  . Physical activity:    Days per week: Not on file    Minutes per session: Not on file  . Stress: Not on file  Relationships  . Social connections:    Talks on phone: Not on file     Gets together: Not on file    Attends religious service: Not on file    Active member of club or organization: Not on file    Attends meetings of clubs or organizations: Not on file    Relationship status: Not on file  Other Topics Concern  . Not on file  Social History Narrative   Born in [place], lives w/ father    2 child, lost one    Sleep: Fair  Appetite:  Fair  Current Medications: Current Facility-Administered Medications  Medication Dose Route Frequency Provider Last Rate Last Dose  . acetaminophen (TYLENOL) tablet 650 mg  650 mg Oral Q6H PRN [name], NP   650 mg at 10/11/18 0417  . alum & mag hydroxide-simeth (MAALOX/MYLANTA) 200-200-20 MG/5ML suspension 30 mL  30 mL Oral Q4H PRN [name], NP      . ARIPiprazole (ABILIFY) tablet 2 mg  2 mg Oral Once [name], MD      . carvedilol (COREG) tablet 50 mg  50 mg Oral BID WC [name], MD      . hydrochlorothiazide (MICROZIDE) capsule 12.5 mg  12.5 mg Oral Daily [name], [name], MD   12.5 mg at 10/11/18 0755  . Influenza vac split quadrivalent PF (FLUARIX) injection 0.5 mL  0.5 mL Intramuscular Tomorrow-1000 [name], [name], MD      . lisinopril (PRINIVIL,ZESTRIL) tablet 20 mg  20 mg Oral Daily [name], [name], MD   20 mg at 10/11/18 0755  . LORazepam (ATIVAN) tablet 0.5 mg  0.5 mg Oral TID [name], [name], MD   0.5 mg at 10/10/18 1701  . LORazepam (ATIVAN) tablet 0.5 mg  0.5 mg Oral Q6H PRN [name], [name], MD      . magnesium hydroxide (MILK OF MAGNESIA) suspension 30 mL  30 mL Oral Daily PRN [name], NP      . risperiDONE (RISPERDAL M-TABS) disintegrating tablet 3 mg  3 mg Oral BID [name], MD   3 mg at 10/11/18 0755  . traZODone (DESYREL) tablet 50 mg  50 mg Oral QHS PRN [name], NP        Lab Results: No results found for this or any previous visit (from the past 95 hour(s)).  Blood Alcohol level:  Lab Results  Component Value Date   [facility] <10  10/06/2018   ETH <11 83/15/1761    Metabolic Disorder Labs: Lab Results  Component Value Date   HGBA1C 5.9 (H) 10/09/2018   MPG 122.63 10/09/2018   MPG 117 (H) 04/08/2012  No results found for: PROLACTIN Lab Results  Component Value Date   CHOL 199 10/09/2018   TRIG 121 10/09/2018   HDL 57 10/09/2018   CHOLHDL 3.5 10/09/2018   VLDL 24 10/09/2018   LDLCALC 118 (H) 10/09/2018   LDLCALC 123 (H) 06/29/2013    Physical Findings: AIMS: Facial and Oral Movements Muscles of Facial Expression: None, normal Lips and Perioral Area: None, normal Jaw: None, normal Tongue: None, normal,Extremity Movements Upper (arms, wrists, hands, fingers): None, normal Lower (legs, knees, ankles, toes): None, normal, Trunk Movements Neck, shoulders, hips: None, normal, Overall Severity Severity of abnormal movements (highest score from questions above): None, normal Incapacitation due to abnormal movements: None, normal Patient's awareness of abnormal movements (rate only patient's report): No Awareness, Dental Status Current problems with teeth and/or dentures?: No Does patient usually wear dentures?: No  CIWA:  CIWA-Ar Total: 0 COWS:  COWS Total Score: 0  Musculoskeletal: Strength & Muscle Tone: within normal limits Gait & Station: normal Patient leans: N/A  Psychiatric Specialty Exam: Physical Exam  ROS  Blood pressure (!) 144/96, pulse 67, temperature 98.7 F (37.1 C), temperature source Oral, resp. rate 18, height 5\' 2"  (1.575 m), weight 57.2 kg, SpO2 100 %.Body mass index is 23.05 kg/m.  General Appearance: Casual  Eye Contact:  Fair  Speech:  Pressured  Volume:  Normal  Mood:  Anxious  Affect:  Constricted  Thought Process:  Irrelevant  Orientation:  Full (Time, Place, and Person)  Thought Content:  Tangential  Suicidal Thoughts:  No  Homicidal Thoughts:  No  Memory:  Immediate;   Fair  Judgement:  fair  Insight:  fair  Psychomotor Activity:  Normal  Concentration:   Concentration: Good  Recall:  Good  Fund of Knowledge:  Good  Language:  Negative  Akathisia:  Negative  Handed:  Right  AIMS (if indicated):     Assets:  Physical Health Resilience  ADL's:  Intact  Cognition:  WNL  Sleep:  Number of Hours: 3.75     Treatment Plan Summary: Daily contact with patient to assess and evaluate symptoms and progress in treatment, Medication management and Plan cont 15 min precautions-cont risperdal- d/c tegretol cont med education  [name], MD 10/11/2018, 10:39 AM

## 2018-10-12 DIAGNOSIS — F3110 Bipolar disorder, current episode manic without psychotic features, unspecified: Secondary | ICD-10-CM | POA: Diagnosis not present

## 2018-10-12 NOTE — Progress Notes (Signed)
[facility] MD Progress Note  10/12/2018 10:32 AM Nancy Drake  MRN:  [id number] Subjective:    Patient did refuse medications this morning however states she will comply going forward this is a chronic issue for her, she had accepted the Risperdal wants no other mood stabilizer and does not want long-acting injectable medication her outpatient clinician phone to inform the patient did indeed threaten the staff however they will take her back in the clinic she has had long-term stability on risperidone and it was tapered down due to the stability and at present clearly need of continued antipsychotic/bipolar therapy No thoughts of harming self or others continues to deny that she threaten anyone shows that they "misunderstood" her  Principal Problem: Bipolar I disorder, most recent episode (or current) manic ([place]) Diagnosis: Principal Problem:   Bipolar I disorder, most recent episode (or current) manic ([place]) Active Problems:   Bipolar affective disorder, current episode mixed ([place])   Bipolar I disorder, most recent episode (or current) manic, moderate ([place])  Total Time spent with patient: 20 minutes  Past Medical History:  Past Medical History:  Diagnosis Date  . Anxiety    lost a child , sees psych  . Biventricular cardiac pacemaker -Medtronic    DOI 2013  . Cardiomyopathy nonischemic    dx after a syncope 10-2010  . Complete heart block ([place]) 03-2012  . Mole of skin    bx 05-2013  . Thyroid disease    used to see Dr Debbora Presto    Past Surgical History:  Procedure Laterality Date  . BI-VENTRICULAR PACEMAKER INSERTION N/A 04/11/2012   Procedure: BI-VENTRICULAR PACEMAKER INSERTION (CRT-P);  Surgeon: Deboraha Sprang, MD;  Location: [facility] CATH LAB;  Service: Cardiovascular;  Laterality: N/A;  . CESAREAN SECTION    . COLONOSCOPY     2010  . LEAD REVISION N/A 04/12/2012   Procedure: LEAD REVISION;  Surgeon: Evans Lance, MD;  Location: [facility] CATH LAB;  Service: Cardiovascular;  Laterality:  N/A;  . PACEMAKER INSERTION  03-2012  . TEMPORARY PACEMAKER INSERTION Right 04/08/2012   Procedure: TEMPORARY PACEMAKER INSERTION;  Surgeon: Burnell Blanks, MD;  Location: [facility] CATH LAB;  Service: Cardiovascular;  Laterality: Right;   Family History:  Family History  Problem Relation Age of Onset  . Other Other        uncle with pacemaker  . Heart disease Other        PACERMAKER  . Breast cancer Other        cousin  . Diabetes Mother   . Cancer Father        prostate cancer  . Colon cancer Neg Hx   Social History:  Social History   Substance and Sexual Activity  Alcohol Use Yes   Comment: socially      Social History   Substance and Sexual Activity  Drug Use No    Social History   Socioeconomic History  . Marital status: Divorced    Spouse name: Not on file  . Number of children: 2  . Years of education: Not on file  . Highest education level: Not on file  Occupational History  . Occupation: not working at present    Employer: [employer]  . Financial resource strain: Not on file  . Food insecurity:    Worry: Not on file    Inability: Not on file  . Transportation needs:    Medical: Not on file    Non-medical: Not on file  Tobacco  Use  . Smoking status: Never Smoker  . Smokeless tobacco: Never Used  Substance and Sexual Activity  . Alcohol use: Yes    Comment: socially   . Drug use: No  . Sexual activity: Yes    Birth control/protection: Post-menopausal  Lifestyle  . Physical activity:    Days per week: Not on file    Minutes per session: Not on file  . Stress: Not on file  Relationships  . Social connections:    Talks on phone: Not on file    Gets together: Not on file    Attends religious service: Not on file    Active member of club or organization: Not on file    Attends meetings of clubs or organizations: Not on file    Relationship status: Not on file  Other Topics Concern  . Not on file  Social History Narrative   Born in  [place], lives w/ father    2 child, lost one   Sleep: Fair  Appetite:  Fair  Current Medications: Current Facility-Administered Medications  Medication Dose Route Frequency Provider Last Rate Last Dose  . acetaminophen (TYLENOL) tablet 650 mg  650 mg Oral Q6H PRN [name], NP   650 mg at 10/11/18 0417  . alum & mag hydroxide-simeth (MAALOX/MYLANTA) 200-200-20 MG/5ML suspension 30 mL  30 mL Oral Q4H PRN [name], NP      . ARIPiprazole (ABILIFY) tablet 2 mg  2 mg Oral Once [name], MD      . carvedilol (COREG) tablet 50 mg  50 mg Oral BID WC [name], MD   50 mg at 10/12/18 0813  . hydrochlorothiazide (MICROZIDE) capsule 12.5 mg  12.5 mg Oral Daily [name], [name], MD   12.5 mg at 10/11/18 0755  . Influenza vac split quadrivalent PF (FLUARIX) injection 0.5 mL  0.5 mL Intramuscular Tomorrow-1000 [name], [name], MD      . lisinopril (PRINIVIL,ZESTRIL) tablet 20 mg  20 mg Oral Daily [name], [name], MD   Stopped at 10/12/18 [phone]  . LORazepam (ATIVAN) tablet 0.5 mg  0.5 mg Oral TID [name], [name], MD   0.5 mg at 10/12/18 0801  . LORazepam (ATIVAN) tablet 0.5 mg  0.5 mg Oral Q6H PRN [name], [name], MD   0.5 mg at 10/11/18 2059  . magnesium hydroxide (MILK OF MAGNESIA) suspension 30 mL  30 mL Oral Daily PRN [name], NP      . risperiDONE (RISPERDAL M-TABS) disintegrating tablet 3 mg  3 mg Oral BID [name], MD   3 mg at 10/12/18 0801  . traZODone (DESYREL) tablet 50 mg  50 mg Oral QHS PRN [name], NP        Lab Results: No results found for this or any previous visit (from the past 14 hour(s)).  Blood Alcohol level:  Lab Results  Component Value Date   [facility] <10 10/06/2018   ETH <11 67/20/9470    Metabolic Disorder Labs: Lab Results  Component Value Date   HGBA1C 5.9 (H) 10/09/2018   MPG 122.63 10/09/2018   MPG 117 (H) 04/08/2012   No results found for: PROLACTIN Lab Results  Component Value Date   CHOL 199  10/09/2018   TRIG 121 10/09/2018   HDL 57 10/09/2018   CHOLHDL 3.5 10/09/2018   VLDL 24 10/09/2018   LDLCALC 118 (H) 10/09/2018   LDLCALC 123 (H) 06/29/2013    Physical Findings: AIMS: Facial and Oral Movements Muscles  of Facial Expression: None, normal Lips and Perioral Area: None, normal Jaw: None, normal Tongue: None, normal,Extremity Movements Upper (arms, wrists, hands, fingers): None, normal Lower (legs, knees, ankles, toes): None, normal, Trunk Movements Neck, shoulders, hips: None, normal, Overall Severity Severity of abnormal movements (highest score from questions above): None, normal Incapacitation due to abnormal movements: None, normal Patient's awareness of abnormal movements (rate only patient's report): No Awareness, Dental Status Current problems with teeth and/or dentures?: No Does patient usually wear dentures?: No  CIWA:  CIWA-Ar Total: 0 COWS:  COWS Total Score: 0  Musculoskeletal: Strength & Muscle Tone: within normal limits Gait & Station: normal Patient leans: N/A  Psychiatric Specialty Exam: Physical Exam  ROS  Blood pressure 96/67, pulse 69, temperature 98 F (36.7 C), resp. rate 16, height 5\' 2"  (1.575 m), weight 57.2 kg, SpO2 100 %.Body mass index is 23.05 kg/m.  General Appearance: Casual  Eye Contact:  Fair  Speech:  Pressured  Volume:  Normal  Mood:  Euthymic  Affect:  Labile  Thought Process:  Goal Directed  Orientation:  Full (Time, Place, and Person)  Thought Content:  Tangential  Suicidal Thoughts:  No  Homicidal Thoughts:  No  Memory:  Immediate;   Fair  Judgement:  Fair  Insight:  Fair  Psychomotor Activity:  Normal  Concentration:  Attention Span: Fair  Recall:  [employer] of Knowledge:  Fair  Language:  Fair  Akathisia:  Negative  Handed:  Right  AIMS (if indicated):     Assets:  Physical Health Resilience Social Support  ADL's:  Intact  Cognition:  WNL  Sleep:  Number of Hours: 4     Treatment Plan  Summary: Daily contact with patient to assess and evaluate symptoms and progress in treatment, Medication management and Plan Continue current risperidone therapy continue to encourage mood stabilizer therapy and long-acting injectable continue reality is and cognitive-based therapies no EPS or TD noted  Vonte Rossin, MD 10/12/2018, 10:32 AM

## 2018-10-12 NOTE — [facility] Suicide Risk Assessment (Signed)
[place] INPATIENT:  Family/Significant Other Suicide Prevention Education  Suicide Prevention Education:  Contact Attempts: sister [name] [phone], has been identified by the patient as the family member/significant other with whom the patient will be residing, and identified as the person(s) who will aid the patient in the event of a mental health crisis.  With written consent from the patient, two attempts were made to provide suicide prevention education, prior to and/or following the patient's discharge.  We were unsuccessful in providing suicide prevention education.  A suicide education pamphlet was given to the patient to share with family/significant other.  Date and time of first attempt:  10/12/2018/3:40 PM   Lawana Pai, MSW Intern [place] Department 10/12/2018, 3:42 PM

## 2018-10-12 NOTE — Progress Notes (Signed)
Recreation Therapy Notes  Date: 2.12.20 Time: 1000 Location: 500 Hall Dayroom  Group Topic: Self-Esteem  Goal Area(s) Addresses:  Patient will successfully identify positive attributes about themselves.  Patient will successfully identify benefit of improved self-esteem.   Behavioral Response: Engaged  Intervention: Worksheet, Corporate treasurer, markers, peel and stick designs  Activity: Crest of Arms.  Patients were given a crest that was divided into four sections.  In each section, patients were to identify something that makes them unique, something they are proud of, important dates, things they have accomplished or just whatever they are proud of about themselves.  Education:  Self-Esteem, Dentist.   Education Outcome: Acknowledges education/In group clarification offered/Needs additional education  Clinical Observations/Feedback: Pt stated she has twin girls but hates the 7th because one of them died on the 2022/11/26.  Pt expressed she likes scuba diving, nature, boats, the ocean and crabs.  Pt stated she has a crab pin that she wears sometimes to let people know when she is having a "crabby day".    Victorino Sparrow, LRT/CTRS         Victorino Sparrow A 10/12/2018 11:31 AM

## 2018-10-12 NOTE — Plan of Care (Signed)
D: Pt denies SI/HI/AVH. Pt been in her room majority of the evening. Pt had minimal interaction on the unit A: Pt was offered support and encouragement. Pt was encourage to attend groups. Q 15 minute checks were done for safety.   R: safety maintained on unit.   Problem: Education: Goal: Mental status will improve Outcome: Progressing   Problem: Activity: Goal: Imbalance in normal sleep/wake cycle will improve Outcome: Progressing

## 2018-10-12 NOTE — Plan of Care (Signed)
  Problem: Coping: Goal: Ability to identify and develop effective coping behavior will improve Outcome: Progressing   D: Pt alert and oriented on the unit. Pt denies SI/HI, A/VH. Pt rated her depression a 0, hopelessness a 0, and anxiety a 2, all on a scale of 0 to 10, with 10 being the worst. Pt is pleasant and cooperative. A: Education, support and encouragement provided, q15 minute safety checks remain in effect. Medications administered per MD orders. R: No reactions/side effects to medicine noted. Pt denies any concerns at this time, and verbally contracts for safety. Pt ambulating on the unit with no issues. Pt remains safe on and off the unit.

## 2018-10-13 DIAGNOSIS — F3110 Bipolar disorder, current episode manic without psychotic features, unspecified: Secondary | ICD-10-CM | POA: Diagnosis not present

## 2018-10-13 NOTE — Plan of Care (Signed)
Progress note  D: pt found in the dayroom; compliant with medication administration. Pt denies any physical symptoms or pain. Pt seems to be less manic today and not as fixated on numbers, dates, her children, her father, and her medications. Pt denies si/hi/ah/vh and verbally agrees to approach staff if these become apparent or before harming herself or others while at [facility]. Pt is still bathing 3x/day. A: pt provided support and encouragement. Pt given medication per protocol and standing orders. Q78m safety checks implemented and continued.  R: pt safe on the unit. Will continue to monitor.   Pt progressing in the following metrics   Problem: Education: Goal: Knowledge of [place] [facility] Education information/materials will improve Outcome: Progressing Goal: Emotional status will improve Outcome: Progressing Goal: Mental status will improve Outcome: Progressing Goal: Verbalization of understanding the information provided will improve Outcome: Progressing
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
## 2018-10-13 NOTE — BHH Suicide Risk Assessment (Signed)
[place] INPATIENT:  Family/Significant Other Suicide Prevention Education  Suicide Prevention Education:  Education Completed; Nancy Drake, sister, [phone] been identified by the patient as the family member/significant other with whom the patient will be residing, and identified as the person(s) who will aid the patient in the event of a mental health crisis (suicidal ideations/suicide attempt).  With written consent from the patient, the family member/significant other has been provided the following suicide prevention education, prior to the and/or following the discharge of the patient.  The suicide prevention education provided includes the following:  Suicide risk factors  Suicide prevention and interventions  National Suicide Hotline telephone number  [facility] assessment telephone number  [facility] Emergency Assistance [place] and/or Residential Mobile Crisis Unit telephone number  Request made of family/significant other to:  Remove weapons (e.g., guns, rifles, knives), all items previously/currently identified as safety concern.  Sister reports no guns in the home.  Remove drugs/medications (over-the-counter, prescriptions, illicit drugs), all items previously/currently identified as a safety concern.  The family member/significant other verbalizes understanding of the suicide prevention education information provided.  The family member/significant other agrees to remove the items of safety concern listed above.  Sister is from new [place] but currently in [place], has been visiting every night.  Reports pt has been better last two nights, sister OK with plan for discharge tomorrow and available to pick her up from hospital.  Sister speaks to them every day and will monitor going forward.  Pt was very stable on risperdal for many years before deciding to stop taking it.    [name], LCSW 10/13/2018, 11:53 AM

## 2018-10-13 NOTE — Progress Notes (Signed)
D:  Nancy Drake was in her room at the beginning of the shift.  When RN went to assess, she was resting quietly and appeared to be asleep.   A:  Offered 1:1 with RN for support and encouragement.  Q 15 minute checks maintained for safety.  Encouraged participation in group and unit activities.   R:  Nancy Drake remains safe on the unit.  We will continue to monitor the progress towards her goals.

## 2018-10-13 NOTE — Progress Notes (Signed)
Did not attend group 

## 2018-10-13 NOTE — Progress Notes (Signed)
[facility] MD Progress Note  10/13/2018 10:21 AM Nancy Drake  MRN:  [id number] Subjective:   Patient has continued to comply with Risperdal and therefore has improved and may be at or very near her baseline she is redirectable she is alert and oriented to person place situation time and day, denies auditory visual hallucinations is not paranoid or agitated or argumentative today. Involuntary movements not noted Has heard standard risk benefits side effects and warnings of antipsychotics previously  Principal Problem: Bipolar I disorder, most recent episode (or current) manic ([place]) Diagnosis: Principal Problem:   Bipolar I disorder, most recent episode (or current) manic ([place]) Active Problems:   Bipolar affective disorder, current episode mixed ([place])   Bipolar I disorder, most recent episode (or current) manic, moderate ([place])  Total Time spent with patient: 20 minutes  Past Medical History:  Past Medical History:  Diagnosis Date  . Anxiety    lost a child , sees psych  . Biventricular cardiac pacemaker -[employer]    DOI 2013  . Cardiomyopathy nonischemic    dx after a syncope 10-2010  . Complete heart block ([place]) 03-2012  . Mole of skin    bx 05-2013  . Thyroid disease    used to see Dr Debbora Presto    Past Surgical History:  Procedure Laterality Date  . BI-VENTRICULAR PACEMAKER INSERTION N/A 04/11/2012   Procedure: BI-VENTRICULAR PACEMAKER INSERTION (CRT-P);  Surgeon: Deboraha Sprang, MD;  Location: [facility] CATH LAB;  Service: Cardiovascular;  Laterality: N/A;  . CESAREAN SECTION    . COLONOSCOPY     2010  . LEAD REVISION N/A 04/12/2012   Procedure: LEAD REVISION;  Surgeon: Evans Lance, MD;  Location: [facility] CATH LAB;  Service: Cardiovascular;  Laterality: N/A;  . PACEMAKER INSERTION  03-2012  . TEMPORARY PACEMAKER INSERTION Right 04/08/2012   Procedure: TEMPORARY PACEMAKER INSERTION;  Surgeon: Burnell Blanks, MD;  Location: [facility] CATH LAB;  Service: Cardiovascular;  Laterality: Right;    Family History:  Family History  Problem Relation Age of Onset  . Other Other        uncle with pacemaker  . Heart disease Other        PACERMAKER  . Breast cancer Other        cousin  . Diabetes Mother   . Cancer Father        prostate cancer  . Colon cancer Neg Hx     Social History:  Social History   Substance and Sexual Activity  Alcohol Use Yes   Comment: socially      Social History   Substance and Sexual Activity  Drug Use No    Social History   Socioeconomic History  . Marital status: Divorced    Spouse name: Not on file  . Number of children: 2  . Years of education: Not on file  . Highest education level: Not on file  Occupational History  . Occupation: not working at present    Employer: [employer]  . Financial resource strain: Not on file  . Food insecurity:    Worry: Not on file    Inability: Not on file  . Transportation needs:    Medical: Not on file    Non-medical: Not on file  Tobacco Use  . Smoking status: Never Smoker  . Smokeless tobacco: Never Used  Substance and Sexual Activity  . Alcohol use: Yes    Comment: socially   . Drug use: No  . Sexual activity: Yes  Birth control/protection: Post-menopausal  Lifestyle  . Physical activity:    Days per week: Not on file    Minutes per session: Not on file  . Stress: Not on file  Relationships  . Social connections:    Talks on phone: Not on file    Gets together: Not on file    Attends religious service: Not on file    Active member of club or organization: Not on file    Attends meetings of clubs or organizations: Not on file    Relationship status: Not on file  Other Topics Concern  . Not on file  Social History Narrative   Born in [place], lives w/ father    2 child, lost one   Sleep: Fair  Appetite:  Fair  Current Medications: Current Facility-Administered Medications  Medication Dose Route Frequency Provider Last Rate Last Dose  . acetaminophen (TYLENOL)  tablet 650 mg  650 mg Oral Q6H PRN [name], NP   650 mg at 10/11/18 0417  . alum & mag hydroxide-simeth (MAALOX/MYLANTA) 200-200-20 MG/5ML suspension 30 mL  30 mL Oral Q4H PRN [name], NP      . ARIPiprazole (ABILIFY) tablet 2 mg  2 mg Oral Once [name], MD      . carvedilol (COREG) tablet 50 mg  50 mg Oral BID WC [name], MD   50 mg at 10/13/18 0824  . hydrochlorothiazide (MICROZIDE) capsule 12.5 mg  12.5 mg Oral Daily [name], [name], MD   12.5 mg at 10/13/18 0824  . Influenza vac split quadrivalent PF (FLUARIX) injection 0.5 mL  0.5 mL Intramuscular Tomorrow-1000 [name], [name], MD      . lisinopril (PRINIVIL,ZESTRIL) tablet 20 mg  20 mg Oral Daily [name], [name], MD   20 mg at 10/13/18 0824  . LORazepam (ATIVAN) tablet 0.5 mg  0.5 mg Oral TID [name], [name], MD   0.5 mg at 10/13/18 0825  . LORazepam (ATIVAN) tablet 0.5 mg  0.5 mg Oral Q6H PRN [name], [name], MD   0.5 mg at 10/11/18 2059  . magnesium hydroxide (MILK OF MAGNESIA) suspension 30 mL  30 mL Oral Daily PRN [name], NP      . risperiDONE (RISPERDAL M-TABS) disintegrating tablet 3 mg  3 mg Oral BID [name], MD   3 mg at 10/13/18 3354  . traZODone (DESYREL) tablet 50 mg  50 mg Oral QHS PRN [name], NP        Lab Results: No results found for this or any previous visit (from the past 58 hour(s)).  Blood Alcohol level:  Lab Results  Component Value Date   ETH <10 10/06/2018   ETH <11 56/25/6389    Metabolic Disorder Labs: Lab Results  Component Value Date   HGBA1C 5.9 (H) 10/09/2018   MPG 122.63 10/09/2018   MPG 117 (H) 04/08/2012   No results found for: PROLACTIN Lab Results  Component Value Date   CHOL 199 10/09/2018   TRIG 121 10/09/2018   HDL 57 10/09/2018   CHOLHDL 3.5 10/09/2018   VLDL 24 10/09/2018   LDLCALC 118 (H) 10/09/2018   LDLCALC 123 (H) 06/29/2013    Physical Findings: AIMS: Facial and Oral Movements Muscles of  Facial Expression: None, normal Lips and Perioral Area: None, normal Jaw: None, normal Tongue: None, normal,Extremity Movements Upper (arms, wrists, hands, fingers): None, normal Lower (legs, knees, ankles, toes): None, normal, Trunk Movements Neck, shoulders, hips: None, normal, Overall Severity  Severity of abnormal movements (highest score from questions above): None, normal Incapacitation due to abnormal movements: None, normal Patient's awareness of abnormal movements (rate only patient's report): No Awareness, Dental Status Current problems with teeth and/or dentures?: No Does patient usually wear dentures?: No  CIWA:  CIWA-Ar Total: 0 COWS:  COWS Total Score: 0  Musculoskeletal: Strength & Muscle Tone: within normal limits Gait & Station: normal Patient leans: N/A  Psychiatric Specialty Exam: Physical Exam  ROS  Blood pressure 113/79, pulse 66, temperature 98 F (36.7 C), resp. rate 16, height 5\' 2"  (1.575 m), weight 57.2 kg, SpO2 100 %.Body mass index is 23.05 kg/m.  General Appearance: Casual  Eye Contact:  Good  Speech:  Clear and Coherent  Volume:  Normal  Mood:  Euthymic  Affect:  Congruent  Thought Process:  Coherent  Orientation:  Full (Time, Place, and Person)  Thought Content:  Tangential  Suicidal Thoughts:  No  Homicidal Thoughts:  No  Memory:  Immediate;   Good  Judgement:  Intact  Insight:  Good  Psychomotor Activity:  Normal  Concentration:  Concentration: Good  Recall:  Good  Fund of Knowledge:  Good  Language:nl  Akathisia:  Negative  Handed:  Right  AIMS (if indicated):     Assets:  Physical Health Resilience Social Support  ADL's:  Intact  Cognition:  WNL  Sleep:  Number of Hours: 7.5     Treatment Plan Summary: Daily contact with patient to assess and evaluate symptoms and progress in treatment, Medication management and Plan Seems to be close to baseline no change in meds or precautions probable discharge tomorrow  morning  [name], MD 10/13/2018, 10:21 AM

## 2018-10-14 DIAGNOSIS — F3110 Bipolar disorder, current episode manic without psychotic features, unspecified: Secondary | ICD-10-CM | POA: Diagnosis not present

## 2018-10-14 MED ORDER — RISPERIDONE 2 MG PO TABS: ORAL_TABLET | ORAL | 2 refills | Status: DC

## 2018-10-14 MED ORDER — BENZTROPINE MESYLATE 0.5 MG PO TABS: 1.0000 mg | ORAL_TABLET | Freq: Two times a day (BID) | ORAL | 2 refills | Status: DC

## 2018-10-14 MED ORDER — LORAZEPAM 0.5 MG PO TABS: 0.5000 mg | ORAL_TABLET | Freq: Three times a day (TID) | ORAL | 0 refills | Status: AC

## 2018-10-14 NOTE — Progress Notes (Signed)
Recreation Therapy Notes  Date: 2.14.19 Time: 1000 Location: 500 Hall Dayroom   Group Topic: Communication, Team Building, Problem Solving  Goal Area(s) Addresses:  Patient will effectively work with peer towards shared goal.  Patient will identify skill used to make activity successful.  Patient will identify how skills used during activity can be used to reach post d/c goals.   Intervention: STEM Activity   Activity: [employer]. Patients were provided the following materials: 5 drinking straws, 5 rubber bands, 5 paper clips, 2 index cards and 2 drinking cups. Using the provided materials patients were asked to build a launching mechanisms to launch a ping pong ball approximately 12 feet. Patients were divided into teams of 3-5.   Education: Education officer, community, Dentist.   Education Outcome: Acknowledges education/In group clarification offered/Needs additional education.   Clinical Observations/Feedback:  Pt did not attend group.     Nancy Drake, LRT/CTRS         Nancy Drake A 10/14/2018 11:14 AM

## 2018-10-14 NOTE — Plan of Care (Signed)
Pt engaged in recreation therapy group sessions in calm and appropriate mood.   Victorino Sparrow, LRT/CTRS

## 2018-10-14 NOTE — Tx Team (Signed)
Interdisciplinary Treatment and Diagnostic Plan Update  10/14/2018 Time of Session: [place] MRN: [id number]  Principal Diagnosis: Bipolar I disorder, most recent episode (or current) manic ([place])  Secondary Diagnoses: Principal Problem:   Bipolar I disorder, most recent episode (or current) manic ([place]) Active Problems:   Bipolar affective disorder, current episode mixed ([place])   Bipolar I disorder, most recent episode (or current) manic, moderate ([place])   Current Medications:  Current Facility-Administered Medications  Medication Dose Route Frequency Provider Last Rate Last Dose   acetaminophen (TYLENOL) tablet 650 mg  650 mg Oral Q6H PRN [name], NP   650 mg at 10/11/18 0417   alum & mag hydroxide-simeth (MAALOX/MYLANTA) 200-200-20 MG/5ML suspension 30 mL  30 mL Oral Q4H PRN [name], NP       ARIPiprazole (ABILIFY) tablet 2 mg  2 mg Oral Once [name], MD       carvedilol (COREG) tablet 50 mg  50 mg Oral BID WC [name], MD   50 mg at 10/14/18 0757   hydrochlorothiazide (MICROZIDE) capsule 12.5 mg  12.5 mg Oral Daily [name], [name], MD   12.5 mg at 10/14/18 0757   Influenza vac split quadrivalent PF (FLUARIX) injection 0.5 mL  0.5 mL Intramuscular Tomorrow-1000 [name], [name], MD       lisinopril (PRINIVIL,ZESTRIL) tablet 20 mg  20 mg Oral Daily [name], [name], MD   20 mg at 10/14/18 0756   LORazepam (ATIVAN) tablet 0.5 mg  0.5 mg Oral TID [name], [name], MD   0.5 mg at 10/14/18 0756   LORazepam (ATIVAN) tablet 0.5 mg  0.5 mg Oral Q6H PRN [name], [name], MD   0.5 mg at 10/11/18 2059   magnesium hydroxide (MILK OF MAGNESIA) suspension 30 mL  30 mL Oral Daily PRN [name], NP       risperiDONE (RISPERDAL M-TABS) disintegrating tablet 3 mg  3 mg Oral BID [name], MD   3 mg at 10/14/18 0756   traZODone (DESYREL) tablet 50 mg  50 mg Oral QHS PRN [name], NP       Current Outpatient  Medications  Medication Sig Dispense Refill   benztropine (COGENTIN) 0.5 MG tablet Take 2 tablets (1 mg total) by mouth 2 (two) times daily. 60 tablet 2   carvedilol (COREG) 25 MG tablet TAKE 1 TABLET(25 MG) BY MOUTH TWICE DAILY WITH A MEAL (Patient taking differently: Take 25 mg by mouth 2 (two) times daily with a meal. TAKE 1 TABLET(25 MG) BY MOUTH TWICE DAILY WITH A MEAL) 60 tablet 3   lisinopril (PRINIVIL,ZESTRIL) 10 MG tablet TAKE 2 TABLETS BY MOUTH TWICE DAILY (Patient not taking: Reported on 10/07/2018) 360 tablet 1   lisinopril-hydrochlorothiazide (PRINZIDE,ZESTORETIC) 20-12.5 MG tablet Take 2 tablets by mouth daily.     LORazepam (ATIVAN) 0.5 MG tablet Take 1 tablet (0.5 mg total) by mouth 3 (three) times daily for 3 days. 9 tablet 0   risperiDONE (RISPERDAL) 2 MG tablet 1 q am 2 q hs x 10 days Then 2 q hs then on 90 tablet 2   PTA Medications: No medications prior to admission.    Patient Stressors: Financial difficulties Health problems Legal issue  Patient Strengths: Ability for insight Average or above average intelligence Communication skills  Treatment Modalities: Medication Management, Group therapy, Case management,  1 to 1 session with clinician, Psychoeducation, Recreational therapy.   Physician Treatment Plan for Primary Diagnosis: Bipolar I disorder, most recent  episode (or current) manic ([place]) Long Term Goal(s): Improvement in symptoms so as ready for discharge Improvement in symptoms so as ready for discharge   Short Term Goals: Ability to demonstrate self-control will improve Compliance with prescribed medications will improve Ability to identify changes in lifestyle to reduce recurrence of condition will improve Ability to identify and develop effective coping behaviors will improve  Medication Management: Evaluate patient's response, side effects, and tolerance of medication regimen.  Therapeutic Interventions: 1 to 1 sessions, Unit Group sessions and  Medication administration.  Evaluation of Outcomes: Adequate for Discharge  Physician Treatment Plan for Secondary Diagnosis: Principal Problem:   Bipolar I disorder, most recent episode (or current) manic ([place]) Active Problems:   Bipolar affective disorder, current episode mixed ([place])   Bipolar I disorder, most recent episode (or current) manic, moderate ([place])  Long Term Goal(s): Improvement in symptoms so as ready for discharge Improvement in symptoms so as ready for discharge   Short Term Goals: Ability to demonstrate self-control will improve Compliance with prescribed medications will improve Ability to identify changes in lifestyle to reduce recurrence of condition will improve Ability to identify and develop effective coping behaviors will improve     Medication Management: Evaluate patient's response, side effects, and tolerance of medication regimen.  Therapeutic Interventions: 1 to 1 sessions, Unit Group sessions and Medication administration.  Evaluation of Outcomes: Adequate for Discharge   RN Treatment Plan for Primary Diagnosis: Bipolar I disorder, most recent episode (or current) manic ([place]) Long Term Goal(s): Knowledge of disease and therapeutic regimen to maintain health will improve  Short Term Goals: Ability to identify and develop effective coping behaviors will improve and Compliance with prescribed medications will improve  Medication Management: RN will administer medications as ordered by provider, will assess and evaluate patient's response and provide education to patient for prescribed medication. RN will report any adverse and/or side effects to prescribing provider.  Therapeutic Interventions: 1 on 1 counseling sessions, Psychoeducation, Medication administration, Evaluate responses to treatment, Monitor vital signs and CBGs as ordered, Perform/monitor CIWA, COWS, AIMS and Fall Risk screenings as ordered, Perform wound care treatments as  ordered.  Evaluation of Outcomes: Adequate for Discharge   LCSW Treatment Plan for Primary Diagnosis: Bipolar I disorder, most recent episode (or current) manic ([place]) Long Term Goal(s): Safe transition to appropriate next level of care at discharge, Engage patient in therapeutic group addressing interpersonal concerns.  Short Term Goals: Engage patient in aftercare planning with referrals and resources, Identify triggers associated with mental health/substance abuse issues and Increase skills for wellness and recovery  Therapeutic Interventions: Assess for all discharge needs, 1 to 1 time with Social worker, Explore available resources and support systems, Assess for adequacy in community support network, Educate family and significant other(s) on suicide prevention, Complete Psychosocial Assessment, Interpersonal group therapy.  Evaluation of Outcomes: Adequate for Discharge   Progress in Treatment: Attending groups: Yes. Participating in groups: Yes. Taking medication as prescribed: Yes. Toleration medication: Yes. Family/Significant other contact made: No, will contact:  sister Patient understands diagnosis: No. Discussing patient identified problems/goals with staff: Yes. Medical problems stabilized or resolved: Yes. Denies suicidal/homicidal ideation: Yes. Issues/concerns per patient self-inventory: No. Other: none  New problem(s) identified: No, Describe:  none  New Short Term/Long Term Goal(s):  Patient Goals:  "discharge"  Discharge Plan or Barriers:   Reason for Continuation of Hospitalization: Mania Medication stabilization  Estimated Length of Stay: Ready for Discharge Attendees: Patient: Nancy Drake 10/14/2018   Physician: Dr. Jake Samples, MD  10/14/2018   Nursing: Elesa Massed, RN 10/14/2018   RN Care Manager: 10/14/2018   Social Worker:  10/14/2018   Recreational Therapist:  10/14/2018   Other: Lawana Pai, MSW Intern 10/14/2018   Other:  10/14/2018   Other:  10/14/2018     Scribe for Treatment Team: Lawana Pai, MSW Intern [place] Department 10/14/2018 1:21 PM

## 2018-10-14 NOTE — Progress Notes (Signed)
  [facility] Adult Case Management Discharge Plan :  Will you be returning to the same living situation after discharge:  Yes,  home with father  At discharge, do you have transportation home?: Yes,  sister picking up Do you have the ability to pay for your medications: Yes,  BCBS  Release of information consent forms completed and in the chart; letter on chart.  Patient to Follow up at: [place], [facility] Follow up on 10/18/2018.   Specialty:  Behavioral Health Why:  Hospital follow up appointment with Dr. Casimiro Needle is Tuesday, 2/18 at 10:00a. Please bring your current medications and discharge paperwork from this hospitalization.  Contact information: [address] [place] [address] [phone]           Next level of care provider has access to [place] and Suicide Prevention discussed: Yes,  with sister  Have you used any form of tobacco in the last 30 days? (Cigarettes, Smokeless Tobacco, Cigars, and/or Pipes): No  Has patient been referred to the Quitline?: N/A patient is not a smoker  Patient has been referred for addiction treatment: Yes  Joellen [place], [place] 10/14/2018, 9:25 AM

## 2018-10-14 NOTE — BHH Suicide Risk Assessment (Signed)
[facility] Discharge Suicide Risk Assessment   Nursing information obtained from:  Patient Demographic factors:  Low socioeconomic status Current Mental Status:  NA Loss Factors:  Financial problems / change in socioeconomic status Historical Factors:  NA Risk Reduction Factors:  Sense of responsibility to family  Total Time spent with patient: 45 minutes Principal Problem: Bipolar I disorder, most recent episode (or current) manic ([place]) Diagnosis:  Principal Problem:   Bipolar I disorder, most recent episode (or current) manic ([place]) Active Problems:   Bipolar affective disorder, current episode mixed ([place])   Bipolar I disorder, most recent episode (or current) manic, moderate ([place])  Subjective Data: Patient has recalibrated to her baseline she is alert and oriented no manic symptoms no racing thoughts no psychosis still adamantly refuses any mood stabilizer but will take Risperdal and seems to be responding very well, no EPS or TD.  Mood euthymic  Continued Clinical Symptoms:  Alcohol Use Disorder Identification Test Final Score (AUDIT): 0 The "Alcohol Use Disorders Identification Test", Guidelines for Use in Primary Care, Second Edition.  World [facility]). Score between 0-7:  no or low risk or alcohol related problems. Score between 8-15:  moderate risk of alcohol related problems. Score between 16-19:  high risk of alcohol related problems. Score 20 or above:  warrants further diagnostic evaluation for alcohol dependence and treatment.   CLINICAL FACTORS:   Bipolar Disorder:   Mixed State   COGNITIVE FEATURES THAT CONTRIBUTE TO RISK:  None    SUICIDE RISK:   Minimal: No identifiable suicidal ideation.  Patients presenting with no risk factors but with morbid ruminations; may be classified as minimal risk based on the severity of the depressive symptoms  PLAN OF CARE: Discharge home    [facility], MD 10/14/2018, 8:28 AM

## 2018-10-14 NOTE — Progress Notes (Signed)
Discharge note: Patient reviewed discharge paperwork with RN including prescriptions, follow up appointments, and lab work. Patient given the opportunity to ask questions. All concerns were addressed. All belongings were returned to patient. Denied SI/HI/AVH. Patient thanked staff for their care while at the hospital.  Patient was discharged to lobby where her sister was waiting to pick her up.

## 2018-10-14 NOTE — Progress Notes (Signed)
Recreation Therapy Notes  INPATIENT RECREATION TR PLAN  Patient Details Name: Nancy Drake MRN: [id number] DOB: 09/15/1960 Today's Date: 10/14/2018  Rec Therapy Plan Is patient appropriate for Therapeutic Recreation?: Yes Treatment times per week: about 3 days Estimated Length of Stay: 5-7 days TR Treatment/Interventions: Group participation (Comment)  Discharge Criteria Pt will be discharged from therapy if:: Discharged Treatment plan/goals/alternatives discussed and agreed upon by:: Patient/family  Discharge Summary Short term goals set: See patient care plan Short term goals met: Complete Progress toward goals comments: Groups attended Which groups?: Anger management, Self-esteem Reason goals not met: None Therapeutic equipment acquired: N/A Reason patient discharged from therapy: Discharge from hospital Pt/family agrees with progress & goals achieved: Yes Date patient discharged from therapy: 10/14/18     Victorino Sparrow, LRT/CTRS  Ria Comment, Ila Landowski A 10/14/2018, 10:45 AM

## 2018-10-14 NOTE — Plan of Care (Signed)
  Problem: Education: Goal: Knowledge of [facility] Education information/materials will improve Outcome: Adequate for Discharge   Problem: Education: Goal: Emotional status will improve Outcome: Adequate for Discharge   Problem: Education: Goal: Mental status will improve Outcome: Adequate for Discharge   Problem: Education: Goal: Verbalization of understanding the information provided will improve Outcome: Adequate for Discharge   Problem: Coping: Goal: Ability to identify and develop effective coping behavior will improve Outcome: Adequate for Discharge

## 2018-10-14 NOTE — Discharge Summary (Signed)
Physician Discharge Summary Note  Patient:  Nancy Drake is an 58 y.o., female MRN:  [id number] DOB:  03-May-1961 Patient phone:  [phone] (home)  Patient address:   [place] [address],  Total Time spent with patient: Greater than 30 minutes  Date of Admission:  10/08/2018  Date of Discharge: 10-14-18  Reason for Admission: Making homicidal threats.  Principal Problem: Bipolar I disorder, most recent episode (or current) manic ([place])  Discharge Diagnoses: Principal Problem:   Bipolar I disorder, most recent episode (or current) manic ([place]) Active Problems:   Bipolar affective disorder, current episode mixed ([place])   Bipolar I disorder, most recent episode (or current) manic, moderate ([place])  Past Psychiatric History: Bipolar disorder.  Past Medical History:  Past Medical History:  Diagnosis Date  . Anxiety    lost a child , sees psych  . Biventricular cardiac pacemaker -[employer]    DOI 2013  . Cardiomyopathy nonischemic    dx after a syncope 10-2010  . Complete heart block ([place]) 03-2012  . Mole of skin    bx 05-2013  . Thyroid disease    used to see Dr Debbora Presto    Past Surgical History:  Procedure Laterality Date  . BI-VENTRICULAR PACEMAKER INSERTION N/A 04/11/2012   Procedure: BI-VENTRICULAR PACEMAKER INSERTION (CRT-P);  Surgeon: Deboraha Sprang, MD;  Location: [facility] CATH LAB;  Service: Cardiovascular;  Laterality: N/A;  . CESAREAN SECTION    . COLONOSCOPY     2010  . LEAD REVISION N/A 04/12/2012   Procedure: LEAD REVISION;  Surgeon: Evans Lance, MD;  Location: [facility] CATH LAB;  Service: Cardiovascular;  Laterality: N/A;  . PACEMAKER INSERTION  03-2012  . TEMPORARY PACEMAKER INSERTION Right 04/08/2012   Procedure: TEMPORARY PACEMAKER INSERTION;  Surgeon: Burnell Blanks, MD;  Location: [facility] CATH LAB;  Service: Cardiovascular;  Laterality: Right;   Family History:  Family History  Problem Relation Age of Onset  . Other Other    uncle with pacemaker  . Heart disease Other        PACERMAKER  . Breast cancer Other        cousin  . Diabetes Mother   . Cancer Father        prostate cancer  . Colon cancer Neg Hx    Family Psychiatric  History: See H&P  Social History:  Social History   Substance and Sexual Activity  Alcohol Use Yes   Comment: socially      Social History   Substance and Sexual Activity  Drug Use No    Social History   Socioeconomic History  . Marital status: Divorced    Spouse name: Not on file  . Number of children: 2  . Years of education: Not on file  . Highest education level: Not on file  Occupational History  . Occupation: not working at present    Employer: [employer]  . Financial resource strain: Not on file  . Food insecurity:    Worry: Not on file    Inability: Not on file  . Transportation needs:    Medical: Not on file    Non-medical: Not on file  Tobacco Use  . Smoking status: Never Smoker  . Smokeless tobacco: Never Used  Substance and Sexual Activity  . Alcohol use: Yes    Comment: socially   . Drug use: No  . Sexual activity: Yes    Birth control/protection: Post-menopausal  Lifestyle  . Physical activity:  Days per week: Not on file    Minutes per session: Not on file  . Stress: Not on file  Relationships  . Social connections:    Talks on phone: Not on file    Gets together: Not on file    Attends religious service: Not on file    Active member of club or organization: Not on file    Attends meetings of clubs or organizations: Not on file    Relationship status: Not on file  Other Topics Concern  . Not on file  Social History Narrative   Born in [place], lives w/ father    2 child, lost one    Hospital Course: (Per Md's admission assessment): 58 year old female, divorced, lives with her father, has a 76 year old daughter . 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 Patient currently presents with pressured speech, fair  historian/tangential in thought process. As per chart notes, presented to ED via [employer] under IVC due to making homicidal statements at her psychiatrist's office. Chart reports indicate she made threats to " shoot everyone". At this time reports " all I said is I wish I had a gun, so that people would not abuse me like they do". States she does not have access to a firearm. States " I was angry because I say something and it gets twisted all the time ", and states she was specifically angry with the secretarial staff but cannot specify why. She states she has been doing well recently, and denies recent psychiatric symptoms. Currently presents pressured, tangential , frequently making non relevant tangential statementsas an example , states" I ride bike 25 miles a day, they told me I should be in a beauty pageant, that is sad, it should be the twinsgoing to it, I am not going to go to [place] to get breast surgery".No prior psychiatric admissions, states she was seen in ED in 2013 for " being given Ambien, which I cannot take". Reports she has been seeing Dr. Casimiro Needle for outpatient psychiatric treatment for many years . States she has been diagnosed with Postpartum Depression in the past . Denies history of suicide attempts, denies history of violence . Denies history of psychosis. Denies alcohol abuse, states she drinks one glass of wine of most evenings .Denies drug abuse. Admission BAL negative, admission UDS positive for BZDs. Home medications - prescribed Xanax 1 mgr TID, but states she usually takes one tablet per day or sometimes not at all. Does state she had taken 2 Xanax tablets on day of admission.States shewas on Risperidone for 18 years in the past, but has been off it for about a year.  Ms. Hornbaker was admitted to the [facility] adult unit for crisis management due to worsening symptoms of Bipolar disorder. She was apparently making homicidal threats while at her outpatient psychiatric provider's  office. She was brought to the [facility] for evaluation under an IVC.   After evaluation of her presenting symptoms, Ms. manon was recommended for mood stabilization treatments. The medication regimen for her presenting symptoms were discussed & initiated. Their indications & side effects were explained to the patient. Her other pre-existing medical problems were identified & treated accordingly by resuming her home medications as deemed appropriate.  During the course of her hosptalization, Ms. Gautreau's improvement was monitored by observation & her daily report of symptom reduction noted.  Her emotional & mental status were monitored by daily self-inventory reports completed by her & the clinical staff. She reported continued  improvement on daily basis & denied any new concerns. She was encouraged to attend group sessions to help with recognizing triggers of her emotional crises & ways to cope better with them.         Ms. Wishon was evaluated by the treatment team on daily basis for mood stability and plans for continued recovery after discharge. She was offered further treatment options upon discharge on an outpatient basis as noted below. She was encouraged to maintain satisfactory support network and home environment. She was instructed & encouraged to adhere to her medication regimen as recommended by her treatment team.    Upon discharge, Ms. Degraffenreid was both mentally and medically stable denying suicidal/homicidal ideation, auditory/visual/tactile hallucinations, delusional thoughts & or paranoia. She left [facility] with all personal belongings in no distress. Transportation per her arrangement (sister).   Physical Findings: AIMS: Facial and Oral Movements Muscles of Facial Expression: None, normal Lips and Perioral Area: None, normal Jaw: None, normal Tongue: None, normal,Extremity Movements Upper (arms, wrists, hands, fingers): None, normal Lower (legs, knees, ankles, toes): None, normal,  Trunk Movements Neck, shoulders, hips: None, normal, Overall Severity Severity of abnormal movements (highest score from questions above): None, normal Incapacitation due to abnormal movements: None, normal Patient's awareness of abnormal movements (rate only patient's report): No Awareness, Dental Status Current problems with teeth and/or dentures?: No Does patient usually wear dentures?: No  CIWA:  CIWA-Ar Total: 0 COWS:  COWS Total Score: 0  Musculoskeletal: Strength & Muscle Tone: within normal limits Gait & Station: normal Patient leans: N/A  Psychiatric Specialty Exam: Physical Exam  Nursing note and vitals reviewed. Constitutional: She is oriented to person, place, and time. She appears well-developed.  HENT:  Head: Normocephalic.  Eyes: Pupils are equal, round, and reactive to light.  Neck: Normal range of motion.  Cardiovascular: Normal rate.  Respiratory: Effort normal.  Genitourinary:    Genitourinary Comments: Deferred   Musculoskeletal: Normal range of motion.  Neurological: She is alert and oriented to person, place, and time.  Skin: Skin is warm and dry.    Review of Systems  Constitutional: Negative.   HENT: Negative.   Eyes: Negative.   Respiratory: Negative for cough and shortness of breath.   Cardiovascular: Negative.  Negative for chest pain and palpitations.  Gastrointestinal: Negative.  Negative for heartburn, nausea and vomiting.  Genitourinary: Negative.   Skin: Negative.   Neurological: Negative for dizziness and headaches.  Endo/Heme/Allergies: Negative.   Psychiatric/Behavioral: Positive for depression (Stable), hallucinations (Hx. psychosis (stable)) and substance abuse (Hx. benzodiazepine use). Negative for memory loss and suicidal ideas. The patient has insomnia (Stabilized with medication prior to discharge). The patient is not nervous/anxious (Stable).     Blood pressure 112/82, pulse 67, temperature 97.8 F (36.6 C), temperature source  Oral, resp. rate 16, height 5\' 2"  (1.575 m), weight 57.2 kg, SpO2 100 %.Body mass index is 23.05 kg/m.  See Md's discharge SRA   Have you used any form of tobacco in the last 30 days? (Cigarettes, Smokeless Tobacco, Cigars, and/or Pipes): No  Has this patient used any form of tobacco in the last 30 days? (Cigarettes, Smokeless Tobacco, Cigars, and/or Pipes): N/A  Blood Alcohol level:  Lab Results  Component Value Date   [facility] <10 10/06/2018   ETH <11 69/62/9528   Metabolic Disorder Labs:  Lab Results  Component Value Date   HGBA1C 5.9 (H) 10/09/2018   MPG 122.63 10/09/2018   MPG 117 (H) 04/08/2012   No results found for: PROLACTIN  Lab Results  Component Value Date   CHOL 199 10/09/2018   TRIG 121 10/09/2018   HDL 57 10/09/2018   CHOLHDL 3.5 10/09/2018   VLDL 24 10/09/2018   LDLCALC 118 (H) 10/09/2018   LDLCALC 123 (H) 06/29/2013   See Psychiatric Specialty Exam and Suicide Risk Assessment completed by Attending Physician prior to discharge.  Discharge destination:  Home  Is patient on multiple antipsychotic therapies at discharge:  No   Has Patient had three or more failed trials of antipsychotic monotherapy by history:  No  Recommended Plan for Multiple Antipsychotic Therapies: NA  Allergies as of 10/14/2018      Reactions   Ambien [zolpidem Tartrate] Other (See Comments)   Pt cannot take any sleeping pills.    Desloratadine    Insomnia      Medication List    STOP taking these medications   ALPRAZolam 0.5 MG tablet Commonly known as:  XANAX     TAKE these medications     Indication  benztropine 0.5 MG tablet Commonly known as:  COGENTIN Take 2 tablets (1 mg total) by mouth 2 (two) times daily.  Indication:  Extrapyramidal Reaction caused by Medications   carvedilol 25 MG tablet Commonly known as:  COREG TAKE 1 TABLET(25 MG) BY MOUTH TWICE DAILY WITH A MEAL What changed:    how much to take  how to take this  when to take this  Indication:  High  Blood Pressure Disorder   lisinopril 10 MG tablet Commonly known as:  PRINIVIL,ZESTRIL TAKE 2 TABLETS BY MOUTH TWICE DAILY  Indication:  High Blood Pressure Disorder   lisinopril-hydrochlorothiazide 20-12.5 MG tablet Commonly known as:  PRINZIDE,ZESTORETIC Take 2 tablets by mouth daily.  Indication:  High Blood Pressure Disorder   LORazepam 0.5 MG tablet Commonly known as:  ATIVAN Take 1 tablet (0.5 mg total) by mouth 3 (three) times daily for 3 days.  Indication:  Anxiousness associated with Depression   risperiDONE 2 MG tablet Commonly known as:  RISPERDAL 1 q am 2 q hs x 10 days Then 2 q hs then on What changed:    medication strength  how much to take  how to take this  when to take this  additional instructions  Indication:  Hypomanic Episode of Bipolar Disorder      [facility] Follow up on 10/18/2018.   Specialty:  Behavioral Health Why:  Hospital follow up appointment with Dr. Casimiro Needle is Tuesday, 2/18 at 10:00a. Please bring your current medications and discharge paperwork from this hospitalization.  Contact information: [place] [id number] [phone]          Follow-up recommendations: Activity:  As tolerated Diet: As recommended by your primary care doctor. Keep all scheduled follow-up appointments as recommended.  Comments: Patient is instructed prior to discharge to: Take all medications as prescribed by his/her mental healthcare provider. Report any adverse effects and or reactions from the medicines to his/her outpatient provider promptly. Patient has been instructed & cautioned: To not engage in alcohol and or illegal drug use while on prescription medicines. In the event of worsening symptoms, patient is instructed to call the crisis hotline, 911 and or go to the nearest ED for appropriate evaluation and treatment of symptoms. To follow-up with his/her primary  care provider for your other medical issues, concerns and or health care needs.   Signed: Lindell Spar, NP, PMHNP, FNP-BC 10/14/2018, 3:01 PM

## 2018-11-10 ENCOUNTER — Encounter: Payer: BLUE CROSS/BLUE SHIELD | Admitting: Internal Medicine

## 2018-11-14 ENCOUNTER — Telehealth: Payer: Self-pay | Admitting: Internal Medicine

## 2018-11-14 NOTE — Telephone Encounter (Signed)
Patient dismissed from [facility] by Cristopher Peru MD, effective 11/11/18. Dismissal letter sent out by 1st class mail. LM

## 2018-11-15 ENCOUNTER — Telehealth: Payer: Self-pay | Admitting: Internal Medicine

## 2018-11-15 ENCOUNTER — Encounter: Payer: Self-pay | Admitting: Internal Medicine

## 2018-11-15 NOTE — Telephone Encounter (Signed)
error 

## 2018-11-18 ENCOUNTER — Telehealth: Payer: Self-pay | Admitting: Internal Medicine

## 2018-11-18 DIAGNOSIS — I428 Other cardiomyopathies: Secondary | ICD-10-CM

## 2018-11-18 DIAGNOSIS — Z950 Presence of cardiac pacemaker: Secondary | ICD-10-CM

## 2018-11-18 NOTE — Telephone Encounter (Signed)
New Message:     Patient calling to get a referral to go to [facility]. Patient is very upset due to a letter she received in the mail Dr.Taylor. Please call patient back.

## 2018-11-24 NOTE — Telephone Encounter (Signed)
Referral made as requested.  Will notify Pt by letter.

## 2018-12-15 NOTE — Telephone Encounter (Signed)
The patient informed me that no one in our office is competent, we are all terrible at our jobs, and she was going to hire a Chief Executive Officer and sue Dr. Harrington Challenger and Dr. Lovena Le for putting in her pacemaker wrong. She also said when she dies she is going to spread her ashes all over our office so we never forget her.

## 2018-12-15 NOTE — Telephone Encounter (Signed)
Pt called and said she never got a letter in the mail.  Was extremely rude to me on the phone.

## 2018-12-20 ENCOUNTER — Encounter: Payer: BLUE CROSS/BLUE SHIELD | Admitting: Internal Medicine

## 2019-01-03 ENCOUNTER — Encounter: Payer: BLUE CROSS/BLUE SHIELD | Admitting: Internal Medicine

## 2019-01-11 ENCOUNTER — Other Ambulatory Visit: Payer: Self-pay | Admitting: Internal Medicine

## 2019-01-11 ENCOUNTER — Telehealth: Payer: Self-pay | Admitting: Pharmacist

## 2019-01-11 NOTE — Telephone Encounter (Signed)
While standing outside of practice building, I heard disturbance inside. Soon after, pt exited sliding doors shouting and stopping in front our cart, three members of our team were standing there and she proceeded to yell very aggressively stating that three of our physicians (Dr. Harrington Challenger, Dr. Caryl Comes, and Dr. Lovena Le) were assholes. She continued to scream about these physicians and their care using explicit language for approximately 20-30 seconds. The staff stood motionless and did not verbally respond to her yelling. She then started walking away and we returned to cleaning our equipment. After getting a few feet away she turned back and shouted that it only took one woman to put these doctors in hell and she would bury their ashes. She continued to scream while walking backward to her car also using explicit language. She then got into her vehicle and sped off while flashing her middle finger.   After she drove off, one of our lab technicians came down to check on [place] as he could hear her yelling from his office on the third floor.

## 2019-01-12 NOTE — Telephone Encounter (Signed)
While manning the COVID screening table in the lobby of [address] for [employer] I witnessed patient come down stairs from second floor.  Not sure how she got in the building as she never came through lobby nor did she go to 3rd floor suite.  As she was coming down the stairs she was screaming and using lots of vulgarity stating " Dr.Taylor doesn't care and using explicit language.  I was involved with a patient screening in lobby so did not stop what I was doing to acknowledge her.  She proceeded to walk out the door and confronted staff that were outside manning the Coumadin patients.  This is the second time I have witnessed her coming from second floor of the building without coming through lobby.  Both times she demonstrated the same type of erratic, irate behavior.

## 2019-01-13 NOTE — Telephone Encounter (Signed)
Once I, Chemical engineer, was notified of this encounter I contacted Chriss Driver, [place], who advised that if Ms. Brickhouse were to come on site again we should call the police first and contact security second. They are going to send her a letter reminding her that she has been dismissed and future visits to our practice will be considered trespassing.

## 2019-02-05 DIAGNOSIS — Z950 Presence of cardiac pacemaker: Secondary | ICD-10-CM | POA: Insufficient documentation

## 2019-05-03 ENCOUNTER — Emergency Department (HOSPITAL_COMMUNITY): Payer: BLUE CROSS/BLUE SHIELD

## 2019-05-03 ENCOUNTER — Emergency Department (HOSPITAL_COMMUNITY)
Admission: EM | Admit: 2019-05-03 | Discharge: 2019-05-04 | Disposition: A | Discharge status: Other Acute Inpatient Hospital | Payer: BLUE CROSS/BLUE SHIELD | Source: Home / Self Care | Attending: Emergency Medicine | Admitting: Emergency Medicine

## 2019-05-03 ENCOUNTER — Encounter (HOSPITAL_COMMUNITY): Payer: Self-pay | Admitting: Emergency Medicine

## 2019-05-03 ENCOUNTER — Other Ambulatory Visit: Payer: Self-pay

## 2019-05-03 DIAGNOSIS — Z888 Allergy status to other drugs, medicaments and biological substances status: Secondary | ICD-10-CM | POA: Insufficient documentation

## 2019-05-03 DIAGNOSIS — F312 Bipolar disorder, current episode manic severe with psychotic features: Secondary | ICD-10-CM

## 2019-05-03 DIAGNOSIS — F419 Anxiety disorder, unspecified: Secondary | ICD-10-CM | POA: Insufficient documentation

## 2019-05-03 DIAGNOSIS — I110 Hypertensive heart disease with heart failure: Secondary | ICD-10-CM | POA: Insufficient documentation

## 2019-05-03 DIAGNOSIS — I5022 Chronic systolic (congestive) heart failure: Secondary | ICD-10-CM | POA: Insufficient documentation

## 2019-05-03 DIAGNOSIS — Z20828 Contact with and (suspected) exposure to other viral communicable diseases: Secondary | ICD-10-CM | POA: Insufficient documentation

## 2019-05-03 DIAGNOSIS — Z950 Presence of cardiac pacemaker: Secondary | ICD-10-CM | POA: Insufficient documentation

## 2019-05-03 DIAGNOSIS — Z79899 Other long term (current) drug therapy: Secondary | ICD-10-CM | POA: Insufficient documentation

## 2019-05-03 DIAGNOSIS — E785 Hyperlipidemia, unspecified: Secondary | ICD-10-CM | POA: Insufficient documentation

## 2019-05-03 LAB — CBC WITH DIFFERENTIAL/PLATELET
Abs Immature Granulocytes: 0.04 10*3/uL (ref 0.00–0.07)
Basophils Absolute: 0.1 10*3/uL (ref 0.0–0.1)
Basophils Relative: 0 %
Eosinophils Absolute: 0.1 10*3/uL (ref 0.0–0.5)
Eosinophils Relative: 1 %
HCT: 37.6 % (ref 36.0–46.0)
Hemoglobin: 12.1 g/dL (ref 12.0–15.0)
Immature Granulocytes: 0 %
Lymphocytes Relative: 20 %
Lymphs Abs: 2.6 10*3/uL (ref 0.7–4.0)
MCH: 30 pg (ref 26.0–34.0)
MCHC: 32.2 g/dL (ref 30.0–36.0)
MCV: 93.1 fL (ref 80.0–100.0)
Monocytes Absolute: 0.9 10*3/uL (ref 0.1–1.0)
Monocytes Relative: 7 %
Neutro Abs: 9.4 10*3/uL — ABNORMAL HIGH (ref 1.7–7.7)
Neutrophils Relative %: 72 %
Platelets: 390 10*3/uL (ref 150–400)
RBC: 4.04 MIL/uL (ref 3.87–5.11)
RDW: 13.4 % (ref 11.5–15.5)
WBC: 13.2 10*3/uL — ABNORMAL HIGH (ref 4.0–10.5)
nRBC: 0 % (ref 0.0–0.2)

## 2019-05-03 LAB — RAPID URINE DRUG SCREEN, HOSP PERFORMED
Amphetamines: NOT DETECTED
Barbiturates: NOT DETECTED
Benzodiazepines: POSITIVE — AB
Cocaine: NOT DETECTED
Opiates: NOT DETECTED
Tetrahydrocannabinol: NOT DETECTED

## 2019-05-03 LAB — COMPREHENSIVE METABOLIC PANEL
ALT: 31 U/L (ref 0–44)
AST: 36 U/L (ref 15–41)
Albumin: 4.4 g/dL (ref 3.5–5.0)
Alkaline Phosphatase: 76 U/L (ref 38–126)
Anion gap: 15 (ref 5–15)
BUN: 16 mg/dL (ref 6–20)
CO2: 19 mmol/L — ABNORMAL LOW (ref 22–32)
Calcium: 9.7 mg/dL (ref 8.9–10.3)
Chloride: 105 mmol/L (ref 98–111)
Creatinine, Ser: 1.25 mg/dL — ABNORMAL HIGH (ref 0.44–1.00)
GFR calc Af Amer: 56 mL/min — ABNORMAL LOW (ref >60)
GFR calc non Af Amer: 48 mL/min — ABNORMAL LOW (ref >60)
Glucose, Bld: 262 mg/dL — ABNORMAL HIGH (ref 70–99)
Potassium: 3 mmol/L — ABNORMAL LOW (ref 3.5–5.1)
Sodium: 139 mmol/L (ref 135–145)
Total Bilirubin: 0.4 mg/dL (ref 0.3–1.2)
Total Protein: 8 g/dL (ref 6.5–8.1)

## 2019-05-03 LAB — ACETAMINOPHEN LEVEL: Acetaminophen (Tylenol), Serum: <10 ug/mL — ABNORMAL LOW (ref 10–30)

## 2019-05-03 LAB — URINALYSIS, ROUTINE W REFLEX MICROSCOPIC
Bilirubin Urine: NEGATIVE
Glucose, UA: NEGATIVE mg/dL
Hgb urine dipstick: NEGATIVE
Ketones, ur: NEGATIVE mg/dL
Nitrite: NEGATIVE
Protein, ur: NEGATIVE mg/dL
Specific Gravity, Urine: 1.009 (ref 1.005–1.030)
pH: 5 (ref 5.0–8.0)

## 2019-05-03 LAB — SALICYLATE LEVEL: Salicylate Lvl: <7 mg/dL (ref 2.8–30.0)

## 2019-05-03 LAB — I-STAT BETA HCG BLOOD, ED (MC, WL, AP ONLY): I-stat hCG, quantitative: 6.6 m[IU]/mL — ABNORMAL HIGH (ref <5)

## 2019-05-03 LAB — ETHANOL: Alcohol, Ethyl (B): <10 mg/dL (ref <10)

## 2019-05-03 MED ORDER — ACETAMINOPHEN 325 MG PO TABS
650.0000 mg | ORAL_TABLET | ORAL | Status: DC | PRN
  Administered 2019-05-04: 650 mg via ORAL
  Filled 2019-05-03: qty 2

## 2019-05-03 MED ORDER — CARVEDILOL 25 MG PO TABS
25.0000 mg | ORAL_TABLET | Freq: Two times a day (BID) | ORAL | Status: DC
  Administered 2019-05-03 – 2019-05-04 (×2): 25 mg via ORAL
  Filled 2019-05-03 (×4): qty 1

## 2019-05-03 MED ORDER — HALOPERIDOL LACTATE 5 MG/ML IJ SOLN
5.0000 mg | Freq: Once | INTRAMUSCULAR | Status: AC
  Administered 2019-05-03: 5 mg via INTRAMUSCULAR
  Filled 2019-05-03: qty 1

## 2019-05-03 MED ORDER — LISINOPRIL 10 MG PO TABS
10.0000 mg | ORAL_TABLET | Freq: Once | ORAL | Status: AC
  Administered 2019-05-03: 10 mg via ORAL
  Filled 2019-05-03: qty 1

## 2019-05-03 MED ORDER — LORAZEPAM 2 MG/ML IJ SOLN
2.0000 mg | Freq: Once | INTRAMUSCULAR | Status: AC
  Administered 2019-05-03: 2 mg via INTRAVENOUS
  Filled 2019-05-03: qty 1

## 2019-05-03 MED ORDER — CARVEDILOL 25 MG PO TABS: 25.0000 mg | ORAL_TABLET | Freq: Two times a day (BID) | ORAL | Status: DC

## 2019-05-03 MED ORDER — ONDANSETRON HCL 4 MG PO TABS: 4.0000 mg | ORAL_TABLET | Freq: Three times a day (TID) | ORAL | Status: DC | PRN

## 2019-05-03 MED ORDER — LISINOPRIL 20 MG PO TABS
20.0000 mg | ORAL_TABLET | Freq: Two times a day (BID) | ORAL | Status: DC
  Administered 2019-05-03 – 2019-05-04 (×2): 20 mg via ORAL
  Filled 2019-05-03 (×2): qty 1

## 2019-05-03 NOTE — ED Notes (Signed)
Pt screaming at whoever walks by, including [employer] officer sitting at bedside. MD Pfeiffer came into room to speak with pt. Pt immediatly changed her tone and is speaking calmly with MD who is currently at bedside.

## 2019-05-03 NOTE — ED Notes (Signed)
Urine and culture sent to lab  

## 2019-05-03 NOTE — ED Notes (Signed)
MD at bedside. When MD entered the room, patient's behavior and demeanor changed. Voice soft now and patient is cooperative.

## 2019-05-03 NOTE — ED Notes (Signed)
Patient dressed out into purple scrubs and non-slip socks. One pair of shorts, a shirt and a bra taken and secured behind nurse's station as well as one ring, 4 beaded bracelets and a silver-colored necklace.

## 2019-05-03 NOTE — ED Triage Notes (Signed)
Patient arrives in police custody, she has been IVC'ed. Neighbor called the cops because patient was out in the middle of the road screaming and yelling. When cops got to her house, she opened the door wearing nothing but a towel, and was verbally and physically aggressive with police. She called [id number] while police where there and tried to chase them off the property. Hx of bipolar with mania, anxiety. Patient has been verbally aggressive, but follows commands at this time.

## 2019-05-03 NOTE — ED Notes (Signed)
Spoke with lab, stated they would add urine culture onto saved culture in lab.

## 2019-05-03 NOTE — ED Provider Notes (Signed)
[place] DEPT Provider Note   CSN: [id number] Arrival date & time: 05/03/19  1918     History   Chief Complaint Chief Complaint  Patient presents with   IVC    HPI Nancy Drake is a 58 y.o. female.     HPI Patient arrives by police custody with IVC in place.  Neighbors called Event organiser as the patient had been out in the middle of the road screaming and yelling.  She is also been in her driveway.  He was having constant tangential conversation yelling obscenities and verbally aggressive.  Patient came to the door with nothing but a towel on and was verbally aggressive with police.  With patient's observed behavior, there was concern for the safety of her father with whom she is living.  He was Spanish only speaking.  Patient speaks English well.  Please felt that he was at risk for possible physical threat as well.  Patient has a history of bipolar disorder with manic episodes.  Patient speech is pressured and tangential.  She is enumerating how many doctors and dentists she has sued in the [place] area due to everyone's gross incompetence.  She is intermittently yelling obscenities very loudly with stream of consciousness speech. Past Medical History:  Diagnosis Date   Anxiety    lost a child , sees psych   Biventricular cardiac pacemaker -Medtronic    DOI 2013   Cardiomyopathy nonischemic    dx after a syncope 10-2010   Complete heart block ([place]) 03-2012   Mole of skin    bx 05-2013   Thyroid disease    used to see Dr Debbora Presto    Patient Active Problem List   Diagnosis Date Noted   Bipolar affective disorder, current episode mixed ([place]) 10/08/2018   Bipolar I disorder, most recent episode (or current) manic ([place]) 10/08/2018   Bipolar I disorder, most recent episode (or current) manic, moderate ([place])    Acute psychosis ([place]) 10/07/2018   Anxiety and depression 02/12/2014   Other and unspecified hyperlipidemia  02/12/2014   Right foot pain 02/12/2014   Essential hypertension, benign 02/12/2014   Mole of skin 06/29/2013   Biventricular cardiac pacemaker -Medtronic    CHB (complete heart block) ([place]) 04/08/2012   Nonischemic cardiomyopathy ([place]) [id number]   CHRONIC SYSTOLIC HEART FAILURE 0000000   Generalized anxiety disorder 10/17/2006    Past Surgical History:  Procedure Laterality Date   BI-VENTRICULAR PACEMAKER INSERTION N/A 04/11/2012   Procedure: BI-VENTRICULAR PACEMAKER INSERTION (CRT-P);  Surgeon: Deboraha Sprang, MD;  Location: [facility] CATH LAB;  Service: Cardiovascular;  Laterality: N/A;   CESAREAN SECTION     COLONOSCOPY     2010   LEAD REVISION N/A 04/12/2012   Procedure: LEAD REVISION;  Surgeon: Evans Lance, MD;  Location: [facility] CATH LAB;  Service: Cardiovascular;  Laterality: N/A;   PACEMAKER INSERTION  03-2012   TEMPORARY PACEMAKER INSERTION Right 04/08/2012   Procedure: TEMPORARY PACEMAKER INSERTION;  Surgeon: Burnell Blanks, MD;  Location: [facility] CATH LAB;  Service: Cardiovascular;  Laterality: Right;     OB History   No obstetric history on file.      Home Medications    Prior to Admission medications   Medication Sig Start Date End Date Taking? Authorizing Provider  benztropine (COGENTIN) 0.5 MG tablet Take 2 tablets (1 mg total) by mouth 2 (two) times daily. 10/14/18 10/14/19  [name], MD  carvedilol (COREG) 25 MG tablet TAKE 1 TABLET(25 MG) BY  MOUTH TWICE DAILY WITH A MEAL Patient taking differently: Take 25 mg by mouth 2 (two) times daily with a meal. TAKE 1 TABLET(25 MG) BY MOUTH TWICE DAILY WITH A MEAL 08/23/18   Evans Lance, MD  lisinopril (PRINIVIL,ZESTRIL) 10 MG tablet TAKE 2 TABLETS BY MOUTH TWICE DAILY Patient not taking: Reported on 10/07/2018 05/04/18   Isaiah Serge, NP  lisinopril-hydrochlorothiazide (PRINZIDE,ZESTORETIC) 20-12.5 MG tablet Take 2 tablets by mouth daily. 09/13/18   [provider]  risperiDONE (RISPERDAL) 2 MG  tablet 1 q am 2 q hs x 10 days Then 2 q hs then on 10/14/18   [name], MD    Family History Family History  Problem Relation Age of Onset   Other Other        uncle with pacemaker   Heart disease Other        PACERMAKER   Breast cancer Other        cousin   Diabetes Mother    Cancer Father        prostate cancer   Colon cancer Neg Hx     Social History Social History   Tobacco Use   Smoking status: Never Smoker   Smokeless tobacco: Never Used  Substance Use Topics   Alcohol use: Yes    Comment: socially    Drug use: No     Allergies   Ambien [zolpidem tartrate] and Desloratadine   Review of Systems Review of Systems Level 5 caveat cannot obtain review of systems due to patient hostile and aggressive behavior at this time.  Physical Exam Updated Vital Signs BP 133/73    Pulse 82    Temp (!) 101.1 F (38.4 C) (Oral)    Resp 20    Ht 5\' 2"  (1.575 m)    Wt 61.2 kg    SpO2 97%    BMI 24.69 kg/m   Physical Exam Constitutional:      Appearance: Normal appearance. She is well-developed.     Comments: Patient is nontoxic and clinically well in appearance.  No respiratory distress.  HENT:     Head: Normocephalic and atraumatic.     Nose: Nose normal.     Mouth/Throat:     Mouth: Mucous membranes are moist.     Pharynx: Oropharynx is clear.  Eyes:     Extraocular Movements: Extraocular movements intact.     Pupils: Pupils are equal, round, and reactive to light.  Neck:     Musculoskeletal: Neck supple.  Cardiovascular:     Rate and Rhythm: Normal rate and regular rhythm.     Heart sounds: Normal heart sounds.  Pulmonary:     Effort: Pulmonary effort is normal.     Breath sounds: Normal breath sounds.  Abdominal:     General: Bowel sounds are normal. There is no distension.     Palpations: Abdomen is soft.     Tenderness: There is no abdominal tenderness.  Musculoskeletal: Normal range of motion.  Skin:    General: Skin is warm and dry.    Neurological:     General: No focal deficit present.     Mental Status: She is alert.     GCS: GCS eye subscore is 4. GCS verbal subscore is 5. GCS motor subscore is 6.     Cranial Nerves: No cranial nerve deficit.     Coordination: Coordination normal.  Psychiatric:     Comments: Mood is hostile with pressured tangential speech.      ED  Treatments / Results  Labs (all labs ordered are listed, but only abnormal results are displayed) Labs Reviewed  COMPREHENSIVE METABOLIC PANEL - Abnormal; Notable for the following components:      Result Value   Potassium 3.0 (*)    CO2 19 (*)    Glucose, Bld 262 (*)    Creatinine, Ser 1.25 (*)    GFR calc non Af Amer 48 (*)    GFR calc Af Amer 56 (*)    All other components within normal limits  RAPID URINE DRUG SCREEN, HOSP PERFORMED - Abnormal; Notable for the following components:   Benzodiazepines POSITIVE (*)    All other components within normal limits  CBC WITH DIFFERENTIAL/PLATELET - Abnormal; Notable for the following components:   WBC 13.2 (*)    Neutro Abs 9.4 (*)    All other components within normal limits  ACETAMINOPHEN LEVEL - Abnormal; Notable for the following components:   Acetaminophen (Tylenol), Serum <10 (*)    All other components within normal limits  URINALYSIS, ROUTINE W REFLEX MICROSCOPIC - Abnormal; Notable for the following components:   APPearance HAZY (*)    Leukocytes,Ua LARGE (*)    Bacteria, UA RARE (*)    All other components within normal limits  I-STAT BETA HCG BLOOD, ED ([facility], [facility], [facility] ONLY) - Abnormal; Notable for the following components:   I-stat hCG, quantitative 6.6 (*)    All other components within normal limits  ETHANOL  SALICYLATE LEVEL  I-STAT BETA HCG BLOOD, ED ([facility], [facility], [facility] ONLY)  CBG MONITORING, ED    EKG None  Radiology No results found.  Procedures Procedures (including critical care time)  Medications Ordered in ED Medications  carvedilol (COREG) tablet 25 mg (25 mg Oral  Given 05/03/19 2131)  lisinopril (ZESTRIL) tablet 20 mg (20 mg Oral Given 05/03/19 2131)  acetaminophen (TYLENOL) tablet 650 mg (has no administration in time range)  ondansetron (ZOFRAN) tablet 4 mg (has no administration in time range)  haloperidol lactate (HALDOL) injection 5 mg (5 mg Intramuscular Given 05/03/19 2055)  LORazepam (ATIVAN) injection 2 mg (2 mg Intravenous Given 05/03/19 2055)  lisinopril (ZESTRIL) tablet 10 mg (10 mg Oral Given 05/03/19 2131)     Initial Impression / Assessment and Plan / ED Course  I have reviewed the triage vital signs and the nursing notes.  Pertinent labs & imaging results that were available during my care of the patient were reviewed by me and considered in my medical decision making (see chart for details).       Patient was brought by police after having been called to the patient's home for severe behavior problems.  Patient had been out in the street in the driveway yelling while wearing only a towel.  She has been yelling obscenities and verbally aggressive but at times also expressing potential physical aggression.  Please have concern for the patient's father for physical threat from the patient.  Patient has had constant stream of consciousness yelling in the emergency department including obscenities and threats.  She is nontoxic and alert.  No respiratory distress.  Physical exam is otherwise normal.  There is documented fever on arrival.  Unclear if this was due to being outside and significant physical activity and being restrained and brought in by police.  Will complete work-up for infectious source.  I anticipate patient will be medically cleared.  Final Clinical Impressions(s) / ED Diagnoses   Final diagnoses:  Bipolar disorder, current episode manic severe with psychotic features ([place])  ED Discharge Orders    None       Charlesetta Shanks, MD 05/03/19 2317

## 2019-05-03 NOTE — ED Notes (Signed)
MD made aware of patient's verbally aggressive behavior. Patient is yelling and cussing, incomprehensible pressured speech, keeps mentioning [place], 9/11, and her dad who is a cancer patient.

## 2019-05-03 NOTE — ED Notes (Signed)
Patient's sister, [name] [id number] called to provide information that patient lives with her elderly father. She did lose a child back on Sept 7 in the 1990's. She recalls that back in Feb 2020 she was admitted to [facility]. She is suppose to be taking Risperdal and Xanax. Also, she has a pacemaker.

## 2019-05-04 ENCOUNTER — Inpatient Hospital Stay (HOSPITAL_COMMUNITY)
Admission: AD | Admit: 2019-05-04 | Discharge: 2019-05-09 | DRG: 885 | Disposition: A | Discharge status: Home / Self Care | Payer: BLUE CROSS/BLUE SHIELD | Attending: Psychiatry | Admitting: Psychiatry

## 2019-05-04 DIAGNOSIS — F333 Major depressive disorder, recurrent, severe with psychotic symptoms: Secondary | ICD-10-CM | POA: Diagnosis present

## 2019-05-04 DIAGNOSIS — G4700 Insomnia, unspecified: Secondary | ICD-10-CM | POA: Diagnosis present

## 2019-05-04 DIAGNOSIS — F419 Anxiety disorder, unspecified: Secondary | ICD-10-CM | POA: Diagnosis present

## 2019-05-04 DIAGNOSIS — Z9114 Patient's other noncompliance with medication regimen: Secondary | ICD-10-CM

## 2019-05-04 DIAGNOSIS — Z20828 Contact with and (suspected) exposure to other viral communicable diseases: Secondary | ICD-10-CM | POA: Diagnosis present

## 2019-05-04 DIAGNOSIS — F312 Bipolar disorder, current episode manic severe with psychotic features: Principal | ICD-10-CM | POA: Diagnosis present

## 2019-05-04 DIAGNOSIS — I10 Essential (primary) hypertension: Secondary | ICD-10-CM | POA: Diagnosis present

## 2019-05-04 DIAGNOSIS — Z950 Presence of cardiac pacemaker: Secondary | ICD-10-CM | POA: Diagnosis not present

## 2019-05-04 LAB — SARS CORONAVIRUS 2 BY RT PCR (HOSPITAL ORDER, PERFORMED IN ~~LOC~~ HOSPITAL LAB): SARS Coronavirus 2: NEGATIVE

## 2019-05-04 MED ORDER — TRAZODONE HCL 50 MG PO TABS: 50.0000 mg | ORAL_TABLET | Freq: Every evening | ORAL | Status: DC | PRN

## 2019-05-04 MED ORDER — ACETAMINOPHEN 325 MG PO TABS
650.0000 mg | ORAL_TABLET | Freq: Four times a day (QID) | ORAL | Status: DC | PRN
  Administered 2019-05-06: 650 mg via ORAL
  Filled 2019-05-04: qty 2

## 2019-05-04 MED ORDER — ALUM & MAG HYDROXIDE-SIMETH 200-200-20 MG/5ML PO SUSP: 30.0000 mL | ORAL | Status: DC | PRN

## 2019-05-04 MED ORDER — LISINOPRIL 20 MG PO TABS
20.0000 mg | ORAL_TABLET | Freq: Two times a day (BID) | ORAL | Status: DC
  Administered 2019-05-04 – 2019-05-05 (×2): 20 mg via ORAL
  Filled 2019-05-04 (×5): qty 1

## 2019-05-04 MED ORDER — ONDANSETRON HCL 4 MG PO TABS: 4.0000 mg | ORAL_TABLET | Freq: Three times a day (TID) | ORAL | Status: DC | PRN

## 2019-05-04 MED ORDER — HYDROXYZINE HCL 25 MG PO TABS
25.0000 mg | ORAL_TABLET | Freq: Three times a day (TID) | ORAL | Status: DC | PRN
  Administered 2019-05-04: 25 mg via ORAL
  Filled 2019-05-04: qty 1

## 2019-05-04 MED ORDER — MAGNESIUM HYDROXIDE 400 MG/5ML PO SUSP: 30.0000 mL | Freq: Every day | ORAL | Status: DC | PRN

## 2019-05-04 MED ORDER — POTASSIUM CHLORIDE CRYS ER 20 MEQ PO TBCR
40.0000 meq | EXTENDED_RELEASE_TABLET | Freq: Once | ORAL | Status: AC
  Administered 2019-05-04: 40 meq via ORAL
  Filled 2019-05-04: qty 2

## 2019-05-04 MED ORDER — CARVEDILOL 25 MG PO TABS
25.0000 mg | ORAL_TABLET | Freq: Two times a day (BID) | ORAL | Status: DC
  Administered 2019-05-05: 25 mg via ORAL
  Filled 2019-05-04 (×4): qty 1

## 2019-05-04 NOTE — ED Notes (Signed)
Security called to be wanded, MD cleared patient to go to [facility], belongings taken to the back.

## 2019-05-04 NOTE — BH Assessment (Signed)
Tele Assessment Note   Patient Name: Nancy Drake MRN: [id number] Referring Physician: Dr. Charlesetta Shanks Location of Patient: [place] Location of Provider: [place] is an 58 y.o. female presenting under IVC after neighbors called law enforcement as the patient had been out in the middle of the road screaming and yelling. Patient denied all allegations. Patient reported she was celebrating in her fathers driveway "my dad passed his prostate cancer test and I was in the driveway cheering and celebrating, then my neighbor started to argue with me, I then came in and took a shower when the police arrived and I only had a towel wrapped around myself when I got upset and went to the door". I was not aggressive with the police officers. Patient reported inpatient mental health was 10/2018 at [facility]. Patient denied self-harming behaviors. Patient denied SI, HI, psychosis and alcohol/drug usage.  PER TRIAGE NOTE: Patient arrives in police custody, she has been IVC'ed. Neighbor called the cops because patient was out in the middle of the road screaming and yelling. When cops got to her house, she opened the door wearing nothing but a towel, and was verbally and physically aggressive with police. She called [id number] while police where there and tried to chase them off the property. Hx of bipolar with mania, anxiety. Patient has been verbally aggressive, but follows commands at this time.   Collateral Contact with patient sister: Patient's sister, [name] [id number] called to provide information that patient lives with her elderly father. She did lose a child back on Sept 7 in the 1990's. Sister recalls that back in Feb 2020 she was admitted to [facility]. Patient is suppose to be taking Risperdal and Xanax. Also, she has a pacemaker. Sister reported patients occasional drinking. Sister confirmed father did pass his prostate cancer test on 05/01/19 and just got results  back. During conflict patient will yell sometimes outside or inside. Patient is not aggressive to anyone. Patient is not taking her medication and that is when she acts like this. Patient lost one of the twins so around this same time every year patient has outburst and is emotionally sensitive about every thing. Patient uses exercise as coping skills and would prefer to walk instead of taking a pills. Sister denied patient ever wanting to harm herself. Sister reported no sleeping medications due to side effects making her hallucinate.  Diagnosis: Anxiety disorder  Past Medical History:  Past Medical History:  Diagnosis Date  . Anxiety    lost a child , sees psych  . Biventricular cardiac pacemaker -Medtronic    DOI 2013  . Cardiomyopathy nonischemic    dx after a syncope 10-2010  . Complete heart block ([place]) 03-2012  . Mole of skin    bx 05-2013  . Thyroid disease    used to see Dr Debbora Presto    Past Surgical History:  Procedure Laterality Date  . BI-VENTRICULAR PACEMAKER INSERTION N/A 04/11/2012   Procedure: BI-VENTRICULAR PACEMAKER INSERTION (CRT-P);  Surgeon: Deboraha Sprang, MD;  Location: [facility] CATH LAB;  Service: Cardiovascular;  Laterality: N/A;  . CESAREAN SECTION    . COLONOSCOPY     2010  . LEAD REVISION N/A 04/12/2012   Procedure: LEAD REVISION;  Surgeon: Evans Lance, MD;  Location: [facility] CATH LAB;  Service: Cardiovascular;  Laterality: N/A;  . PACEMAKER INSERTION  03-2012  . TEMPORARY PACEMAKER INSERTION Right 04/08/2012   Procedure: TEMPORARY PACEMAKER INSERTION;  Surgeon: Burnell Blanks, MD;  Location: [facility] CATH LAB;  Service: Cardiovascular;  Laterality: Right;    Family History:  Family History  Problem Relation Age of Onset  . Other Other        uncle with pacemaker  . Heart disease Other        PACERMAKER  . Breast cancer Other        cousin  . Diabetes Mother   . Cancer Father        prostate cancer  . Colon cancer Neg Hx     Social History:  reports that  she has never smoked. She has never used smokeless tobacco. She reports current alcohol use. She reports that she does not use drugs.  Additional Social History:  Alcohol / Drug Use Pain Medications: see MAR Prescriptions: see MAR Over the Counter: see MAR  CIWA: CIWA-Ar BP: 134/86 Pulse Rate: 66 COWS:    Allergies:  Allergies  Allergen Reactions  . Ambien [Zolpidem Tartrate] Other (See Comments)    Pt cannot take any sleeping pills.   . Desloratadine     Insomnia     Home Medications: (Not in a hospital admission)   OB/GYN Status:  No LMP recorded. (Menstrual status: Perimenopausal).  General Assessment Data [facility]: [facility] TTS Assessment: In system Is this a Tele or Face-to-Face Assessment?: Tele Assessment Is this an Initial Assessment or a Re-assessment for this encounter?: Initial Assessment Patient Accompanied by:: N/A Language Other than English: No Living Arrangements: (family home) What gender do you identify as?: Female Marital status: Divorced Pregnancy Status: Unknown Living Arrangements: Parent Can pt return to current living arrangement?: Yes Admission Status: Involuntary Petitioner: Other Is patient capable of signing voluntary admission?: (IVC) Referral Source: Other(neighbor)  Crisis Care Plan Living Arrangements: Parent Legal Guardian: (self) Name of Psychiatrist: (none ) Name of Therapist: (none)  Education Status Is patient currently in school?: No Is the patient employed, unemployed or receiving disability?: Unemployed  Risk to self with the past 6 months Suicidal Ideation: No Has patient been a risk to self within the past 6 months prior to admission? : No Suicidal Intent: No Has patient had any suicidal intent within the past 6 months prior to admission? : No Is patient at risk for suicide?: No Suicidal Plan?: No Has patient had any suicidal plan within the past 6 months prior to admission? : No Access to Means:  No What has been your use of drugs/alcohol within the last 12 months?: (none) Previous Attempts/Gestures: No How many times?: (n/a) Other Self Harm Risks: (none) Triggers for Past Attempts: (disord with family more. ) Intentional Self Injurious Behavior: None Family Suicide History: No Recent stressful life event(s): (discord with neighbor) Persecutory voices/beliefs?: No Depression: No Depression Symptoms: (denied) Substance abuse history and/or treatment for substance abuse?: No Suicide prevention information given to non-admitted patients: Not applicable  Risk to Others within the past 6 months Homicidal Ideation: No Does patient have any lifetime risk of violence toward others beyond the six months prior to admission? : No Thoughts of Harm to Others: No Current Homicidal Intent: No Current Homicidal Plan: No Access to Homicidal Means: No Identified Victim: (denied) History of harm to others?: No Assessment of Violence: None Noted Violent Behavior Description: (Patient reported no violent behavior.) Does patient have access to weapons?: No Criminal Charges Pending?: No Does patient have a court date: No Is patient on probation?: No  Psychosis Hallucinations: None noted Delusions: None noted  Mental Status Report Appearance/Hygiene: Unremarkable Eye Contact: Poor  Motor Activity: Freedom of movement, Restlessness Speech: Logical/coherent Level of Consciousness: Alert Mood: Anxious Affect: Anxious Anxiety Level: Moderate Thought Processes: Coherent, Relevant Judgement: Partial Orientation: Person, Place, Time, Situation Obsessive Compulsive Thoughts/Behaviors: None  Cognitive Functioning Concentration: Fair Memory: Recent Intact Is patient IDD: No Insight: Poor Appetite: Good Have you had any weight changes? : No Change Sleep: No Change Total Hours of Sleep: ("normal") Vegetative Symptoms: None  ADLScreening [facility] Assessment Services) Patient's cognitive  ability adequate to safely complete daily activities?: Yes Patient able to express need for assistance with ADLs?: Yes Independently performs ADLs?: Yes (appropriate for developmental age)  Prior Inpatient Therapy Prior Inpatient Therapy: No  Prior Outpatient Therapy Prior Outpatient Therapy: No Does patient have an ACCT team?: No Does patient have Intensive In-House Services?  : No Does patient have [facility] services? : No Does patient have P4CC services?: No  ADL Screening (condition at time of admission) Patient's cognitive ability adequate to safely complete daily activities?: Yes Patient able to express need for assistance with ADLs?: Yes Independently performs ADLs?: Yes (appropriate for developmental age)  Regulatory affairs officer (For Healthcare) Does Patient Have a Medical Advance Directive?: No   Disposition:  Disposition Initial Assessment Completed for this Encounter: (self)  Renetta Chalk, NP, recommends overnight observation for safety and stabilization with psych re evaluation in the a.m.  This service was provided via telemedicine using a 2-way, interactive audio and video technology.  Names of all persons participating in this telemedicine service and their role in this encounter. Name: Nancy Drake Role: Patient  Name: Kirtland Bouchard Role: TTS Clinician  Name:  Role:   Name:  Role:     Venora Maples 05/04/2019 1:58 AM

## 2019-05-04 NOTE — BH Assessment (Signed)
[facility] Progress Note  Per Buford Dresser, DO, this pt requires psychiatric hospitalization.  Heather, RN, has assigned pt to [facility] Rm 501-2.  Pt presents under IVC initiated by law enforcement, and upheld by Dr Charlesetta Shanks, and IVC documents have been faxed to [facility].  Pt's nurse, Nena Jordan, has been notified, and agrees to call report to [phone].  Pt is to be transported via Event organiser.   Jalene Mullet, [place] Coordinator [phone]

## 2019-05-04 NOTE — Progress Notes (Signed)
Psychoeducational Group Note  Date:  05/04/2019 Time:  2024  Group Topic/Focus:  Wrap-Up Group:   The focus of this group is to help patients review their daily goal of treatment and discuss progress on daily workbooks.  Participation Level: Did Not Attend  Participation Quality:  Not Applicable  Affect:  Not Applicable  Cognitive:  Not Applicable  Insight:  Not Applicable  Engagement in Group: Not Applicable  Additional Comments:  The patient did not attend group this evening.   Archie Balboa S 05/04/2019, 8:24 PM

## 2019-05-04 NOTE — ED Provider Notes (Addendum)
11:00 PM  Assumed care from Dr. Johnney Killian.  Patient is a 58 year old female with history of psychosis who presents to the emergency department under IVC by the police.  Patient was found to be febrile.  Per Dr. Vallery Ridge patient does not appear acutely ill.  Labs show mild leukocytosis with left shift.  Urine does not appear infected.  Drug screen positive for benzodiazepines.  Chest x-ray, COVID swab pending.  TTS has been consulted.  1:20 AM  Pt's chest x-ray is clear and COVID swab is negative.  Repeat temperature normal.  Patient is resting comfortably.  Medically cleared and awaiting TTS evaluation.  I have completed the physician portion of the IVC paperwork.   I reviewed all nursing notes, vitals, pertinent previous records, EKGs, lab and urine results, imaging (as available).    Juana Haralson, Delice Bison, DO 05/04/19 0121    Gregoria Selvy, Delice Bison, DO 05/04/19 0126

## 2019-05-04 NOTE — Tx Team (Signed)
Initial Treatment Plan 05/04/2019 8:26 PM Nancy Drake [id number]    PATIENT STRESSORS: Traumatic event (Lost one of her twin around this time of year).   PATIENT STRENGTHS: Capable of independent living Communication skills Religious Affiliation Supportive family/friends   PATIENT IDENTIFIED PROBLEMS: Alteration in mood AEB anxiety "I just feel so anxious and sad about everything that happened to me from just celebrating about my father's health".  Alteration in thought process (impaired judgement)                   DISCHARGE CRITERIA:  Improved stabilization in mood, thinking, and/or behavior Verbal commitment to aftercare and medication compliance  PRELIMINARY DISCHARGE PLAN: Outpatient therapy Return to previous living arrangement  PATIENT/FAMILY INVOLVEMENT: This treatment plan has been presented to and reviewed with the patient, Nancy Drake.  The patient have been given the opportunity to ask questions and make suggestions.  Keane Police, RN 05/04/2019, 8:26 PM

## 2019-05-04 NOTE — ED Notes (Signed)
TTS at bedside. 

## 2019-05-04 NOTE — ED Notes (Signed)
Nancy Drake verbalized that she did not say she wanted to kill herself and that she was angry with the police because they twist her words . She was also angry with them because the were laughing at the fact that one of her twin daughters die shortly after being born 70 yrs ago and she was still grieving the death. She also denies making any suicidal statement but admits to verbalizing that she want to her the officer for laughing about her child. Nancy Drake currently is ambulatory with a steady gait verbally appropriate response to questions and denies pain or SOB. She denies SI, HI , or AV hallucinations at this time.

## 2019-05-04 NOTE — Progress Notes (Addendum)
Pt is a 58 y/o female who presents to [facility] under IVC status related to her neighbors calling [employer] as she was in the middle of the street screaming and yelling. Per patient "I don't know why I am here, I was happy and was celebrating the fact that my father was cancer free, some guys called the police". Per note patient was in a towel when police arrived at the home and was verbally aggressive with them. Patient presents anxious, tearful and tangential on assessment. Denies SI, HI, AVH and pain at this time "I am fine now". Denies etoh /drugs Nancy Drake. Pt does have a history of Bipolar I disorder. Vitals done, WNL and recorded in chart. Unit orientation completed, routines discussed and compliance encouraged. Pt was also encouraged to voice concerns.  Q 15 minutes observation initiated upon admission. POC implemented for safety and mood stability.

## 2019-05-04 NOTE — ED Notes (Signed)
Pt discharged safely with [employer].  All belongings were sent with patient.  Pt was in no distress.

## 2019-05-05 ENCOUNTER — Encounter (HOSPITAL_COMMUNITY): Payer: Self-pay

## 2019-05-05 DIAGNOSIS — F312 Bipolar disorder, current episode manic severe with psychotic features: Principal | ICD-10-CM

## 2019-05-05 LAB — URINE CULTURE: Culture: <10000 — AB

## 2019-05-05 MED ORDER — CARVEDILOL 12.5 MG PO TABS
12.5000 mg | ORAL_TABLET | Freq: Two times a day (BID) | ORAL | Status: DC
  Administered 2019-05-05 – 2019-05-09 (×8): 12.5 mg via ORAL
  Filled 2019-05-05 (×10): qty 1

## 2019-05-05 MED ORDER — BENZTROPINE MESYLATE 0.5 MG PO TABS
0.5000 mg | ORAL_TABLET | Freq: Two times a day (BID) | ORAL | Status: DC
  Administered 2019-05-05 – 2019-05-09 (×8): 0.5 mg via ORAL
  Filled 2019-05-05 (×11): qty 1

## 2019-05-05 MED ORDER — TEMAZEPAM 30 MG PO CAPS
30.0000 mg | ORAL_CAPSULE | Freq: Every day | ORAL | Status: DC
  Administered 2019-05-05 – 2019-05-07 (×3): 30 mg via ORAL
  Filled 2019-05-05 (×4): qty 1

## 2019-05-05 MED ORDER — LISINOPRIL 10 MG PO TABS
10.0000 mg | ORAL_TABLET | Freq: Two times a day (BID) | ORAL | Status: DC
  Administered 2019-05-05 – 2019-05-09 (×8): 10 mg via ORAL
  Filled 2019-05-05 (×10): qty 1

## 2019-05-05 MED ORDER — RISPERIDONE 3 MG PO TABS
3.0000 mg | ORAL_TABLET | Freq: Two times a day (BID) | ORAL | Status: DC
  Administered 2019-05-05 – 2019-05-09 (×8): 3 mg via ORAL
  Filled 2019-05-05 (×11): qty 1

## 2019-05-05 NOTE — Progress Notes (Signed)
Pt attended spirituality group facilitated by Simone Curia, MDIv, [place].  Group Description:  Group focused on topic of hope.  Patients participated in facilitated discussion around topic, connecting with one another around experiences and definitions for hope.  Group members engaged with visual explorer photos, reflecting on what hope looks like for them today.  Group engaged in discussion around how their definitions of hope are present today in hospital.   Modalities: Psycho-social ed, Adlerian, Narrative, MI  Patient Progress: Pt was present at beginning of group.  She noted that her name means hope.  Left group at beginning of group - stated that she had twin daughters 23 years ago.  One died at birth.  Stated the birthday is tomorrow.  Did not wish to stay in group for discussion about hope.     Jerene Pitch, MDiv, [facility]

## 2019-05-05 NOTE — [facility] Suicide Risk Assessment (Signed)
[facility] Admission Suicide Risk Assessment    Total Time spent with patient: 45 minutes Principal Problem: Exacerbation of underlying bipolar type condition with mania and delusions Diagnosis:  Active Problems:   MDD (major depressive disorder), recurrent, severe, with psychosis ([place])  Subjective Data: Petition for involuntary commitment/in her neighborhood yelling, wearing only a bath towel  Continued Clinical Symptoms:    The "Alcohol Use Disorders Identification Test", Guidelines for Use in Primary Care, Second Edition.  World Pharmacologist [facility]). Score between 0-7:  no or low risk or alcohol related problems. Score between 8-15:  moderate risk of alcohol related problems. Score between 16-19:  high risk of alcohol related problems. Score 20 or above:  warrants further diagnostic evaluation for alcohol dependence and treatment.   CLINICAL FACTORS: Acute mania Musculoskeletal: Strength & Muscle Tone: within normal limits Gait & Station: normal Patient leans: N/A  Psychiatric Specialty Exam: Physical Exam  Nursing note and vitals reviewed. Constitutional: She appears well-developed and well-nourished.  Cardiovascular: Normal rate and regular rhythm.    Review of Systems  Constitutional: Negative.   HENT: Negative.   Eyes: Negative.   Cardiovascular: Negative.   Gastrointestinal: Negative.   Genitourinary: Negative.   Musculoskeletal: Negative.   Skin: Negative.   Neurological: Negative.   Endo/Heme/Allergies: Negative.     Blood pressure 104/69, pulse 73, temperature 98.1 F (36.7 C), temperature source Oral, resp. rate 18, height 5' 0.5" (1.537 m), weight 64.4 kg, SpO2 99 %.Body mass index is 27.28 kg/m.  General Appearance: Casual  Eye Contact:  Minimal  Speech:  Pressured  Volume:  Normal  Mood:  Euphoric and Hypomanic  Affect:  Congruent  Thought Process:  Irrelevant and Descriptions of Associations: Loose  Orientation:  Full (Time, Place, and Person)   Thought Content:  Illogical and Delusions  Suicidal Thoughts:  No  Homicidal Thoughts:  No  Memory:  Immediate;   Fair  Judgement:  Impaired  Insight:  Lacking  Psychomotor Activity:  Increased  Concentration:  Concentration: Poor  Recall:  Poor  Fund of Knowledge:  Poor  Language:  Fair  Akathisia:  Negative  Handed:  Right  AIMS (if indicated):     Assets:  Communication Skills Desire for Improvement  ADL's:  Intact  Cognition:  WNL  Sleep:  Number of Hours: 9.75     COGNITIVE FEATURES THAT CONTRIBUTE TO RISK:  Loss of executive function    SUICIDE RISK:   Minimal: No identifiable suicidal ideation.  Patients presenting with no risk factors but with morbid ruminations; may be classified as minimal risk based on the severity of the depressive symptoms  PLAN OF CARE: Admit for stabilization  I certify that inpatient services furnished can reasonably be expected to improve the patient's condition.   [name], MD 05/05/2019, 11:50 AM

## 2019-05-05 NOTE — Progress Notes (Signed)
Progress note  D: pt found in the hallway; compliant with medication administration. Pt is still hypervigilant, pacing, intrusive, tangential, and having loose associations with her conversations. Pt is pleasant though. Pt denies any complaints or pain. Pt is appropriate with other patients. Pt denies si/hi/ah/vh and verbally agrees to approach staff if these become apparent or before harming herself/others while at [place]: Pt provided support and encouragement. Pt given medication per protocol and standing orders. Q47m safety checks implemented and continued.  R: Pt safe on the unit. Will continue to monitor.

## 2019-05-05 NOTE — Progress Notes (Signed)
Recreation Therapy Notes  Date: 9.4.20 Time: 1000 Location: 500 Hall Dayroom  Group Topic: Coping Skills  Goal Area(s) Addresses:  Patients will identify positive coping skills. Patients will identify benefits of using coping skills post d/c.  Intervention:  Worksheet  Activity: Coping Skills A to Z..  Patients were to identify a coping skill for each letter of the alphabet.  Education: Radiographer, therapeutic, Dentist.   Education Outcome: Acknowledges understanding/In group clarification offered/Needs additional education.   Clinical Observations/Feedback.  Pt did not attend group.    Victorino Sparrow, LRT/CTRS         Ria Comment, Shakia Sebastiano A 05/05/2019 11:12 AM

## 2019-05-05 NOTE — H&P (Signed)
Psychiatric Admission Assessment Adult  Patient Identification: Nancy Drake MRN:  [id number] Date of Evaluation:  05/05/2019 Chief Complaint:  Anxiety Disorder Principal Diagnosis: Exacerbation of underlying bipolar type condition Diagnosis:  Active Problems:   MDD (major depressive disorder), recurrent, severe, with psychosis ([place])  History of Present Illness:   This is a repeat admission for Nancy Drake, well-known to the service and our outpatient clinics, she is 8 she suffers from a bipolar type condition, once again has required petition for involuntary commitment.  According to petition papers she was wearing only a bath towel in public she was outside yelling at her neighbors and was uncooperative with police when they came to investigate the situation and realize she needed treatment.  The patient herself states she is not on any medication except for her blood pressure medications and she names them specifically but apparently is not taking psychotropic medications.  She has refused long-acting injectables in the past.  Her last admission was in February she had threatened staff at her psychiatry office at this point she denies thoughts of harming self or others lacks insight into her condition, states that she did not need to be here did not need to be brought here that she was just "celebrating outside", describes that her father was cancer free and that she needed to "celebrate!"  Reports compliance with her hypertension medications but nothing else Associated Signs/Symptoms: Depression Symptoms:  psychomotor agitation, (Hypo) Manic Symptoms:  Delusions, Distractibility, Anxiety Symptoms:  n/a Psychotic Symptoms:  Delusions, PTSD Symptoms: NA Total Time spent with patient: 45 minutes  Past Psychiatric History: As discussed recently here with a similar presentation there is issues of compliance and she has previously refused long-acting injectables  Is the patient at  risk to self? Yes.    Has the patient been a risk to self in the past 6 months? No.  Has the patient been a risk to self within the distant past? Yes.    Is the patient a risk to others? Yes.    Has the patient been a risk to others in the past 6 months? No.  Has the patient been a risk to others within the distant past? No.   Prior Inpatient Therapy:  Last multiple admissions was in February Prior Outpatient Therapy:  Nancy Drake tried psychiatric  Alcohol Screening:   Substance Abuse History in the last 12 months:  neg Consequences of Substance Abuse: NA Previous Psychotropic Medications: Yes  Psychological Evaluations: n/a Past Medical History:  Past Medical History:  Diagnosis Date  . Anxiety    lost a child , sees psych  . Biventricular cardiac pacemaker -[employer]    DOI 2013  . Cardiomyopathy nonischemic    dx after a syncope 10-2010  . Complete heart block ([place]) 03-2012  . Mole of skin    bx 05-2013  . Thyroid disease    used to see Dr Debbora Presto    Past Surgical History:  Procedure Laterality Date  . BI-VENTRICULAR PACEMAKER INSERTION N/A 04/11/2012   Procedure: BI-VENTRICULAR PACEMAKER INSERTION (CRT-P);  Surgeon: Deboraha Sprang, MD;  Location: [facility] CATH LAB;  Service: Cardiovascular;  Laterality: N/A;  . CESAREAN SECTION    . COLONOSCOPY     2010  . LEAD REVISION N/A 04/12/2012   Procedure: LEAD REVISION;  Surgeon: Evans Lance, MD;  Location: [facility] CATH LAB;  Service: Cardiovascular;  Laterality: N/A;  . PACEMAKER INSERTION  03-2012  . TEMPORARY PACEMAKER INSERTION Right 04/08/2012   Procedure: TEMPORARY PACEMAKER INSERTION;  Surgeon: Burnell Blanks, MD;  Location: [facility] CATH LAB;  Service: Cardiovascular;  Laterality: Right;   Family History:  Family History  Problem Relation Age of Onset  . Other Other        uncle with pacemaker  . Heart disease Other        PACERMAKER  . Breast cancer Other        cousin  . Diabetes Mother   . Cancer Father        prostate  cancer  . Colon cancer Neg Hx    Family Psychiatric  History: neg Tobacco Screening:   Social History:  Social History   Substance and Sexual Activity  Alcohol Use Yes   Comment: socially      Social History   Substance and Sexual Activity  Drug Use No    Additional Social History:                           Allergies:   Allergies  Allergen Reactions  . Ambien [Zolpidem Tartrate] Other (See Comments)    Pt cannot take any sleeping pills.   . Desloratadine     Insomnia    Lab Results:  Results for orders placed or performed during the hospital encounter of 05/03/19 (from the past 48 hour(s))  Comprehensive metabolic panel     Status: Abnormal   Collection Time: 05/03/19  7:47 PM  Result Value Ref Range   Sodium 139 135 - 145 mmol/L   Potassium 3.0 (L) 3.5 - 5.1 mmol/L   Chloride 105 98 - 111 mmol/L   CO2 19 (L) 22 - 32 mmol/L   Glucose, Bld 262 (H) 70 - 99 mg/dL   BUN 16 6 - 20 mg/dL   Creatinine, Ser 1.25 (H) 0.44 - 1.00 mg/dL   Calcium 9.7 8.9 - 10.3 mg/dL   Total Protein 8.0 6.5 - 8.1 g/dL   Albumin 4.4 3.5 - 5.0 g/dL   AST 36 15 - 41 U/L   ALT 31 0 - 44 U/L   Alkaline Phosphatase 76 38 - 126 U/L   Total Bilirubin 0.4 0.3 - 1.2 mg/dL   GFR calc non Af Amer 48 (L) >60 mL/min   GFR calc Af Amer 56 (L) >60 mL/min   Anion gap 15 5 - 15    Comment: Performed at [facility], [place] [address] [place], [place] [address]  Ethanol     Status: None   Collection Time: 05/03/19  7:47 PM  Result Value Ref Range   Alcohol, Ethyl (B) <10 <10 mg/dL    Comment: (NOTE) Lowest detectable limit for serum alcohol is 10 mg/dL. For medical purposes only. Performed at [facility], [place] [address] [place], [place] [address]   Urine rapid drug screen (hosp performed)     Status: Abnormal   Collection Time: 05/03/19  7:47 PM  Result Value Ref Range   Opiates NONE DETECTED NONE DETECTED   Cocaine NONE DETECTED NONE DETECTED    Benzodiazepines POSITIVE (A) NONE DETECTED   Amphetamines NONE DETECTED NONE DETECTED   Tetrahydrocannabinol NONE DETECTED NONE DETECTED   Barbiturates NONE DETECTED NONE DETECTED    Comment: (NOTE) DRUG SCREEN FOR MEDICAL PURPOSES ONLY.  IF CONFIRMATION IS NEEDED FOR ANY PURPOSE, NOTIFY LAB WITHIN 5 DAYS. LOWEST DETECTABLE LIMITS FOR URINE DRUG SCREEN Drug Class  Cutoff (ng/mL) Amphetamine and metabolites    1000 Barbiturate and metabolites    200 Benzodiazepine                 [id number] Tricyclics and metabolites     300 Opiates and metabolites        300 Cocaine and metabolites        300 THC                            50 Performed at [facility], [place] [address], [place], [place] [address]   CBC with Diff     Status: Abnormal   Collection Time: 05/03/19  7:47 PM  Result Value Ref Range   WBC 13.2 (H) 4.0 - 10.5 K/uL   RBC 4.04 3.87 - 5.11 MIL/uL   Hemoglobin 12.1 12.0 - 15.0 g/dL   HCT 37.6 36.0 - 46.0 %   MCV 93.1 80.0 - 100.0 fL   MCH 30.0 26.0 - 34.0 pg   MCHC 32.2 30.0 - 36.0 g/dL   RDW 13.4 11.5 - 15.5 %   Platelets 390 150 - 400 K/uL   nRBC 0.0 0.0 - 0.2 %   Neutrophils Relative % 72 %   Neutro Abs 9.4 (H) 1.7 - 7.7 K/uL   Lymphocytes Relative 20 %   Lymphs Abs 2.6 0.7 - 4.0 K/uL   Monocytes Relative 7 %   Monocytes Absolute 0.9 0.1 - 1.0 K/uL   Eosinophils Relative 1 %   Eosinophils Absolute 0.1 0.0 - 0.5 K/uL   Basophils Relative 0 %   Basophils Absolute 0.1 0.0 - 0.1 K/uL   Immature Granulocytes 0 %   Abs Immature Granulocytes 0.04 0.00 - 0.07 K/uL    Comment: Performed at [facility], [place] [address], [place], [place] [id number]  Salicylate level     Status: None   Collection Time: 05/03/19  7:47 PM  Result Value Ref Range   Salicylate Lvl [id number] 2.8 - 30.0 mg/dL    Comment: Performed at [facility], [place] [address], [place], [place] [address]  Acetaminophen level     Status: Abnormal    Collection Time: 05/03/19  7:47 PM  Result Value Ref Range   Acetaminophen (Tylenol), Serum <10 (L) 10 - 30 ug/mL    Comment: (NOTE) Therapeutic concentrations vary significantly. A range of 10-30 ug/mL  may be an effective concentration for many patients. However, some  are best treated at concentrations outside of this range. Acetaminophen concentrations >150 ug/mL at 4 hours after ingestion  and >50 ug/mL at 12 hours after ingestion are often associated with  toxic reactions. Performed at [facility], [place] [address] [place], [place] [address]   Urinalysis, Routine w reflex microscopic     Status: Abnormal   Collection Time: 05/03/19  7:47 PM  Result Value Ref Range   Color, Urine YELLOW YELLOW   APPearance HAZY (A) CLEAR   Specific Gravity, Urine 1.009 1.005 - 1.030   pH 5.0 5.0 - 8.0   Glucose, UA NEGATIVE NEGATIVE mg/dL   Hgb urine dipstick NEGATIVE NEGATIVE   Bilirubin Urine NEGATIVE NEGATIVE   Ketones, ur NEGATIVE NEGATIVE mg/dL   Protein, ur NEGATIVE NEGATIVE mg/dL   Nitrite NEGATIVE NEGATIVE   Leukocytes,Ua LARGE (A) NEGATIVE   RBC / HPF 0-5 0 - 5 RBC/hpf   WBC, UA 0-5 0 - 5 WBC/hpf   Bacteria, UA RARE (A)  NONE SEEN   Squamous Epithelial / LPF 0-5 0 - 5   Mucus PRESENT     Comment: Performed at [facility], [place] [address], [place], [place] [address]  I-Stat beta hCG blood, ED     Status: Abnormal   Collection Time: 05/03/19  7:52 PM  Result Value Ref Range   I-stat hCG, quantitative 6.6 (H) <5 mIU/mL   Comment 3            Comment:   GEST. AGE      CONC.  (mIU/mL)   <=1 WEEK        5 - 50     2 WEEKS       50 - 500     3 WEEKS       100 - 10,000     4 WEEKS     1,000 - 30,000        FEMALE AND NON-PREGNANT FEMALE:     LESS THAN 5 mIU/mL   Urine culture     Status: Abnormal   Collection Time: 05/03/19  9:55 PM   Specimen: Urine  Result Value Ref Range   Specimen Description      Urine Performed at [place] [address] [place], [place] [address]    Special Requests      NONE Performed at [facility], [place] [address] [place], [place] [address]    Culture (A)     <10,000 COLONIES/mL INSIGNIFICANT GROWTH Performed at [place] [address], [place], [place] [address]    Report Status 05/05/2019 FINAL   SARS Coronavirus 2 [facility] order, Performed in [facility] hospital lab) Nasopharyngeal Nasopharyngeal Swab     Status: None   Collection Time: 05/03/19 11:02 PM   Specimen: Nasopharyngeal Swab  Result Value Ref Range   SARS Coronavirus 2 NEGATIVE NEGATIVE    Comment: (NOTE) If result is NEGATIVE SARS-CoV-2 target nucleic acids are NOT DETECTED. The SARS-CoV-2 RNA is generally detectable in upper and lower  respiratory specimens during the acute phase of infection. The lowest  concentration of SARS-CoV-2 viral copies this assay can detect is 250  copies / mL. A negative result does not preclude SARS-CoV-2 infection  and should not be used as the sole basis for treatment or other  patient management decisions.  A negative result may occur with  improper specimen collection / handling, submission of specimen other  than nasopharyngeal swab, presence of viral mutation(s) within the  areas targeted by this assay, and inadequate number of viral copies  (<250 copies / mL). A negative result must be combined with clinical  observations, patient history, and epidemiological information. If result is POSITIVE SARS-CoV-2 target nucleic acids are DETECTED. The SARS-CoV-2 RNA is generally detectable in upper and lower  respiratory specimens dur ing the acute phase of infection.  Positive  results are indicative of active infection with SARS-CoV-2.  Clinical  correlation with patient history and other diagnostic information is  necessary to determine patient infection status.  Positive results do  not rule out bacterial infection or  co-infection with other viruses. If result is PRESUMPTIVE POSTIVE SARS-CoV-2 nucleic acids MAY BE PRESENT.   A presumptive positive result was obtained on the submitted specimen  and confirmed on repeat testing.  While 2019 novel coronavirus  (SARS-CoV-2) nucleic acids may be present in the submitted sample  additional confirmatory testing may be necessary for epidemiological  and / or clinical management  purposes  to differentiate between  SARS-CoV-2 and other Sarbecovirus currently known to infect humans.  If clinically indicated additional testing with an alternate test  methodology [phone]) is advised. The SARS-CoV-2 RNA is generally  detectable in upper and lower respiratory sp ecimens during the acute  phase of infection. The expected result is Negative. Fact Sheet for Patients:  StrictlyIdeas.no Fact Sheet for Healthcare Providers: BankingDealers.co.za This test is not yet approved or cleared by the [place] FDA and has been authorized for detection and/or diagnosis of SARS-CoV-2 by FDA under an Emergency Use Authorization (EUA).  This EUA will remain in effect (meaning this test can be used) for the duration of the COVID-19 declaration under Section 564(b)(1) of the Act, 21 U.S.C. section 360bbb-3(b)(1), unless the authorization is terminated or revoked sooner. Performed at [facility], [place] [address] [place], [place] [address]     Blood Alcohol level:  Lab Results  Component Value Date   [facility] <10 05/03/2019   ETH <10 [id number]    Metabolic Disorder Labs:  Lab Results  Component Value Date   HGBA1C 5.9 (H) 10/09/2018   MPG 122.63 10/09/2018   MPG 117 (H) 04/08/2012   No results found for: PROLACTIN Lab Results  Component Value Date   CHOL 199 10/09/2018   TRIG 121 10/09/2018   HDL 57 10/09/2018   CHOLHDL 3.5 10/09/2018   VLDL 24 10/09/2018   LDLCALC 118 (H) 10/09/2018   LDLCALC 123  (H) 06/29/2013    Current Medications: Current Facility-Administered Medications  Medication Dose Route Frequency Provider Last Rate Last Dose  . acetaminophen (TYLENOL) tablet 650 mg  650 mg Oral Q6H PRN [name], NP      . alum & mag hydroxide-simeth (MAALOX/MYLANTA) 200-200-20 MG/5ML suspension 30 mL  30 mL Oral Q4H PRN [name], NP      . carvedilol (COREG) tablet 25 mg  25 mg Oral BID WC [name], NP   25 mg at 05/05/19 0735  . hydrOXYzine (ATARAX/VISTARIL) tablet 25 mg  25 mg Oral TID PRN [name], NP   25 mg at 05/04/19 1825  . lisinopril (ZESTRIL) tablet 20 mg  20 mg Oral BID [name], NP   20 mg at 05/05/19 0735  . magnesium hydroxide (MILK OF MAGNESIA) suspension 30 mL  30 mL Oral Daily PRN [name], NP      . ondansetron [facility]) tablet 4 mg  4 mg Oral Q8H PRN [name], NP      . traZODone (DESYREL) tablet 50 mg  50 mg Oral QHS PRN [name], NP       PTA Medications: Medications Prior to Admission  Medication Sig Dispense Refill Last Dose  . ALPRAZolam (XANAX) 0.5 MG tablet Take 0.5 mg by mouth 2 (two) times daily as needed for anxiety.      . benztropine (COGENTIN) 0.5 MG tablet Take 2 tablets (1 mg total) by mouth 2 (two) times daily. (Patient not taking: Reported on 05/03/2019) 60 tablet 2   . lisinopril-hydrochlorothiazide (PRINZIDE,ZESTORETIC) 20-12.5 MG tablet Take 2 tablets by mouth daily.     . risperiDONE (RISPERDAL) 2 MG tablet 1 q am 2 q hs x 10 days Then 2 q hs then on (Patient taking differently: Take 2 mg by mouth at bedtime. ) 90 tablet 2     Musculoskeletal: Strength & Muscle Tone: within normal limits Gait & Station: normal Patient leans: N/A  Psychiatric Specialty Exam:  Physical Exam  Nursing note and vitals reviewed. Constitutional: She appears well-developed and well-nourished.  Cardiovascular: Normal rate and regular rhythm.    Review of Systems   Constitutional: Negative.   HENT: Negative.   Eyes: Negative.   Cardiovascular: Negative.   Gastrointestinal: Negative.   Genitourinary: Negative.   Musculoskeletal: Negative.   Skin: Negative.   Neurological: Negative.   Endo/Heme/Allergies: Negative.     Blood pressure 104/69, pulse 73, temperature 98.1 F (36.7 C), temperature source Oral, resp. rate 18, height 5' 0.5" (1.537 m), weight 64.4 kg, SpO2 99 %.Body mass index is 27.28 kg/m.  General Appearance: Casual  Eye Contact:  Minimal  Speech:  Pressured  Volume:  Normal  Mood:  Euphoric and Hypomanic  Affect:  Congruent  Thought Process:  Irrelevant and Descriptions of Associations: Loose  Orientation:  Full (Time, Place, and Person)  Thought Content:  Illogical and Delusions  Suicidal Thoughts:  No  Homicidal Thoughts:  No  Memory:  Immediate;   Fair  Judgement:  Impaired  Insight:  Lacking  Psychomotor Activity:  Increased  Concentration:  Concentration: Poor  Recall:  Poor  Fund of Knowledge:  Poor  Language:  Fair  Akathisia:  Negative  Handed:  Right  AIMS (if indicated):     Assets:  Communication Skills Desire for Improvement  ADL's:  Intact  Cognition:  WNL  Sleep:  Number of Hours: 9.75    Treatment Plan Summary: Daily contact with patient to assess and evaluate symptoms and progress in treatment and Medication management  Observation Level/Precautions:  15 minute checks  Laboratory:  UDS  Psychotherapy: Reality and cognitive  Medications: Has done well with risperidone in the past  Consultations: None necessary  Discharge Concerns: Longer term compliance will need long-acting injectable  Estimated LOS: 5-7  Other:   Axis I bipolar manic with psychosis/rule out alprazolam dependency Axis II deferred Axis III medically stable on brief exam   Physician Treatment Plan for Primary Diagnosis: The acute need is to restart Risperdal for mania/bipolar exacerbation, monitor for alprazolam withdrawal,  treat accordingly and administer long-acting injectable prior to discharge Long Term Goal(s): Improvement in symptoms so as ready for discharge  Short Term Goals: Ability to identify changes in lifestyle to reduce recurrence of condition will improve, Ability to verbalize feelings will improve, Ability to demonstrate self-control will improve and Ability to identify and develop effective coping behaviors will improve  Physician Treatment Plan for Secondary Diagnosis: Active Problems:   MDD (major depressive disorder), recurrent, severe, with psychosis ([place])  Long Term Goal(s): Improvement in symptoms so as ready for discharge  Short Term Goals: Ability to identify changes in lifestyle to reduce recurrence of condition will improve, Ability to disclose and discuss suicidal ideas, Ability to demonstrate self-control will improve and Ability to identify and develop effective coping behaviors will improve  I certify that inpatient services furnished can reasonably be expected to improve the patient's condition.    [name], MD 9/4/202011:29 AM

## 2019-05-05 NOTE — Progress Notes (Signed)
Recreation Therapy Notes  Patient admitted to unit 9.3.20. Due to admission within last year, no new assessment conducted at this time. Last assessment conducted 2.11.20. Patient was very talkative.  Patient reports reason for current admission was celebrating her dad being cancer free and the neighbors called the cops on her.  Pt stated her stressors were making sure dad was cancer free, get a colon test and a gynecological exam.  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  Pt expressed areas of improvement were getting rid of her [place] accent.   Patient denies SI, HI, AVH at this time. Patient reports goal of going home.     Victorino Sparrow, LRT/CTRS   Victorino Sparrow A 05/05/2019 11:42 AM

## 2019-05-05 NOTE — Tx Team (Signed)
Interdisciplinary Treatment and Diagnostic Plan Update  05/05/2019 Time of Session: 01:10pm Nancy Drake MRN: [id number]  Principal Diagnosis: <principal problem not specified>  Secondary Diagnoses: Active Problems:   Bipolar I disorder, current or most recent episode manic, with psychotic features (Nancy Drake)   MDD (major depressive disorder), recurrent, severe, with psychosis (Nancy Drake)   Current Medications:  Current Facility-Administered Medications  Medication Dose Route Frequency Provider Last Rate Last Dose  . acetaminophen (TYLENOL) tablet 650 mg  650 mg Oral Q6H PRN [name], NP      . alum & mag hydroxide-simeth (MAALOX/MYLANTA) 200-200-20 MG/5ML suspension 30 mL  30 mL Oral Q4H PRN [name], NP      . benztropine (COGENTIN) tablet 0.5 mg  0.5 mg Oral BID [name], MD      . carvedilol (COREG) tablet 12.5 mg  12.5 mg Oral BID WC [name], MD      . hydrOXYzine (ATARAX/VISTARIL) tablet 25 mg  25 mg Oral TID PRN [name], NP   25 mg at 05/04/19 1825  . lisinopril (ZESTRIL) tablet 10 mg  10 mg Oral BID [name], MD      . magnesium hydroxide (MILK OF MAGNESIA) suspension 30 mL  30 mL Oral Daily PRN [name], NP      . ondansetron [facility]) tablet 4 mg  4 mg Oral Q8H PRN [name], NP      . risperiDONE (RISPERDAL) tablet 3 mg  3 mg Oral BID [name], MD      . temazepam (RESTORIL) capsule 30 mg  30 mg Oral QHS [name], MD      . traZODone (DESYREL) tablet 50 mg  50 mg Oral QHS PRN [name], NP       PTA Medications: Medications Prior to Admission  Medication Sig Dispense Refill Last Dose  . ALPRAZolam (XANAX) 0.5 MG tablet Take 0.5 mg by mouth 2 (two) times daily as needed for anxiety.      . benztropine (COGENTIN) 0.5 MG tablet Take 2 tablets (1 mg total) by mouth 2 (two) times daily. (Patient not taking: Reported on 05/03/2019) 60 tablet 2   . lisinopril-hydrochlorothiazide  (PRINZIDE,ZESTORETIC) 20-12.5 MG tablet Take 2 tablets by mouth daily.     . risperiDONE (RISPERDAL) 2 MG tablet 1 q am 2 q hs x 10 days Then 2 q hs then on (Patient taking differently: Take 2 mg by mouth at bedtime. ) 90 tablet 2     Patient Stressors: Traumatic event  Patient Strengths: Capable of independent living Communication skills Religious Affiliation Supportive family/friends  Treatment Modalities: Medication Management, Group therapy, Case management,  1 to 1 session with clinician, Psychoeducation, Recreational therapy.   Physician Treatment Plan for Primary Diagnosis: <principal problem not specified> Long Term Goal(s): Improvement in symptoms so as ready for discharge Improvement in symptoms so as ready for discharge   Short Term Goals: Ability to identify changes in lifestyle to reduce recurrence of condition will improve Ability to verbalize feelings will improve Ability to demonstrate self-control will improve Ability to identify and develop effective coping behaviors will improve Ability to identify changes in lifestyle to reduce recurrence of condition will improve Ability to disclose and discuss suicidal ideas Ability to demonstrate self-control will improve Ability to identify and develop effective coping behaviors will improve  Medication Management: Evaluate patient's response, side effects, and tolerance of medication regimen.  Therapeutic Interventions: 1 to 1 sessions, Unit Group sessions and Medication administration.  Evaluation of Outcomes: Not Met  Physician Treatment Plan for Secondary Diagnosis: Active Problems:   Bipolar I disorder, current or most recent episode manic, with psychotic features (Nancy Drake)   MDD (major depressive disorder), recurrent, severe, with psychosis (Nancy Drake)  Long Term Goal(s): Improvement in symptoms so as ready for discharge Improvement in symptoms so as ready for discharge   Short Term Goals: Ability to identify changes in  lifestyle to reduce recurrence of condition will improve Ability to verbalize feelings will improve Ability to demonstrate self-control will improve Ability to identify and develop effective coping behaviors will improve Ability to identify changes in lifestyle to reduce recurrence of condition will improve Ability to disclose and discuss suicidal ideas Ability to demonstrate self-control will improve Ability to identify and develop effective coping behaviors will improve     Medication Management: Evaluate patient's response, side effects, and tolerance of medication regimen.  Therapeutic Interventions: 1 to 1 sessions, Unit Group sessions and Medication administration.  Evaluation of Outcomes: Not Met   RN Treatment Plan for Primary Diagnosis: <principal problem not specified> Long Term Goal(s): Knowledge of disease and therapeutic regimen to maintain health will improve  Short Term Goals: Ability to participate in decision making will improve, Ability to verbalize feelings will improve, Ability to disclose and discuss suicidal ideas, Ability to identify and develop effective coping behaviors will improve and Compliance with prescribed medications will improve  Medication Management: RN will administer medications as ordered by provider, will assess and evaluate patient's response and provide education to patient for prescribed medication. RN will report any adverse and/or side effects to prescribing provider.  Therapeutic Interventions: 1 on 1 counseling sessions, Psychoeducation, Medication administration, Evaluate responses to treatment, Monitor vital signs and CBGs as ordered, Perform/monitor CIWA, COWS, AIMS and Fall Risk screenings as ordered, Perform wound care treatments as ordered.  Evaluation of Outcomes: Not Met   LCSW Treatment Plan for Primary Diagnosis: <principal problem not specified> Long Term Goal(s): Safe transition to appropriate next level of care at discharge,  Engage patient in therapeutic group addressing interpersonal concerns.  Short Term Goals: Engage patient in aftercare planning with referrals and resources and Increase skills for wellness and recovery  Therapeutic Interventions: Assess for all discharge needs, 1 to 1 time with Social worker, Explore available resources and support systems, Assess for adequacy in community support network, Educate family and significant other(s) on suicide prevention, Complete Psychosocial Assessment, Interpersonal group therapy.  Evaluation of Outcomes: Not Met   Progress in Treatment: Attending groups: No. Participating in groups: No. Taking medication as prescribed: Yes. Toleration medication: Yes. Family/Significant other contact made: No, will contact:  will contact if  given consent to contact Patient understands diagnosis: No. Discussing patient identified problems/goals with staff: Yes. Medical problems stabilized or resolved: Yes. Denies suicidal/homicidal ideation: Yes. Issues/concerns per patient self-inventory: No. Other:   New problem(s) identified: No, Describe:  None  New Short Term/Long Term Goal(s): Medication stabilization, elimination of SI thoughts, and development of a comprehensive mental wellness plan.   Patient Goals:  "To score 4 goals"   Discharge Plan or Barriers: CSW will continue to follow up for appropriate referrals and possible discharge planning  Reason for Continuation of Hospitalization: Depression Hallucinations Medication stabilization  Estimated Length of Stay: 5-7 days   Attendees: Patient: Nancy Drake 05/05/2019   Physician: Dr. [name], MD 05/05/2019   Nursing: Legrand Como, RN 05/05/2019  RN Care Manager: 05/05/2019   Social Worker: Ardelle Anton, LCSW  05/05/2019   Recreational Therapist:  05/05/2019  Social Work Intern: Ovidio Kin  05/05/2019   Other:  05/05/2019  Other: 05/05/2019     Scribe for Treatment Team: Trecia Rogers, LCSW 05/05/2019 1:41  PM

## 2019-05-06 ENCOUNTER — Other Ambulatory Visit: Payer: Self-pay

## 2019-05-06 LAB — HEMOGLOBIN A1C
Hgb A1c MFr Bld: 5.7 % — ABNORMAL HIGH (ref 4.8–5.6)
Mean Plasma Glucose: 116.89 mg/dL

## 2019-05-06 LAB — TSH: TSH: 4.168 u[IU]/mL (ref 0.350–4.500)

## 2019-05-06 LAB — GLUCOSE, CAPILLARY: Glucose-Capillary: 106 mg/dL — ABNORMAL HIGH (ref 70–99)

## 2019-05-06 NOTE — Progress Notes (Signed)
Patient has been observed up in the dayroom watching tv tonight while her roommate had a visitor. She reports having had a good day and had no complaints. She was compliant with her medication. Safety maintained on unit with 15 min checks.

## 2019-05-06 NOTE — Progress Notes (Signed)
[facility] MD Progress Note  05/06/2019 10:04 AM Nancy Drake  MRN:  [id number] Subjective: Patient is a 58 year old female with a past psychiatric history significant for bipolar disorder who presented on 05/05/2019 under involuntary commitment secondary to walking around in public in a bath towel, yelling at her neighbors, and uncooperative with police when they came to investigate the situation.  The patient's last psychiatric admission at our facility was in February 2020, and she was discharged on Abilify, Risperdal, Tegretol, lorazepam and trazodone.  Objective: Patient is seen and examined.  Patient is a 57 year old female with the above-stated past psychiatric history who is seen in follow-up.  Patient was admitted yesterday secondary to worsening of her bipolar disorder symptoms.  She had been noncompliant with her medications, and in the past had refused long-acting injectable medications.  Today she is rather sad.  She stated that today is the anniversary of the death of 1 of her daughters.  She stated that the daughter had died because of "a doctor's mistake".  When asked about her compliance with medications prior to admission she deviates from the question and begins to discuss the problems that she has had obtaining a therapist.  She is tearful throughout the interview secondary to thoughts about her daughter.  She stated that the reason why she went outside with a towel wrapped around her was she was celebrating the fact that her father was apparently cancer free.  Review of the electronic medical record revealed collateral information that her sister stated that the patient had not been taking her medicines.  She also confirmed that 1 of the twins died approximately around this time of year, and approximately at that point she has outburst and is emotionally sensitive about everything.  She is mildly tangential, sad and tearful.  Review of her laboratories on admission showed a significantly  elevated glucose at 262, a mildly elevated creatinine at 1.25.  Her CBC showed a mildly elevated white blood cell count at 13.2.  Her urinalysis showed a large leukocyte Estrace positive.  Rare bacteria, and 0-5 squamous epithelial cells.  There were also 0-5 white blood cells.  Drug screen was positive for benzodiazepines.  Her urine culture showed less than 10,000 colonies of insignificant significance.  Her vital signs are stable, she is afebrile.  She slept 6.75 hours last night.  Principal Problem: <principal problem not specified> Diagnosis: Active Problems:   Bipolar I disorder, current or most recent episode manic, with psychotic features ([place])   MDD (major depressive disorder), recurrent, severe, with psychosis ([place])  Total Time spent with patient: 20 minutes  Past Psychiatric History: See admission H&P  Past Medical History:  Past Medical History:  Diagnosis Date  . Anxiety    lost a child , sees psych  . Biventricular cardiac pacemaker -[employer]    DOI 2013  . Cardiomyopathy nonischemic    dx after a syncope 10-2010  . Complete heart block ([place]) 03-2012  . Mole of skin    bx 05-2013  . Thyroid disease    used to see Dr Debbora Presto    Past Surgical History:  Procedure Laterality Date  . BI-VENTRICULAR PACEMAKER INSERTION N/A 04/11/2012   Procedure: BI-VENTRICULAR PACEMAKER INSERTION (CRT-P);  Surgeon: Deboraha Sprang, MD;  Location: [facility] CATH LAB;  Service: Cardiovascular;  Laterality: N/A;  . CESAREAN SECTION    . COLONOSCOPY     2010  . LEAD REVISION N/A 04/12/2012   Procedure: LEAD REVISION;  Surgeon: Evans Lance, MD;  Location: [facility] CATH LAB;  Service: Cardiovascular;  Laterality: N/A;  . PACEMAKER INSERTION  03-2012  . TEMPORARY PACEMAKER INSERTION Right 04/08/2012   Procedure: TEMPORARY PACEMAKER INSERTION;  Surgeon: Burnell Blanks, MD;  Location: [facility] CATH LAB;  Service: Cardiovascular;  Laterality: Right;   Family History:  Family History  Problem Relation Age of  Onset  . Other Other        uncle with pacemaker  . Heart disease Other        PACERMAKER  . Breast cancer Other        cousin  . Diabetes Mother   . Cancer Father        prostate cancer  . Colon cancer Neg Hx    Family Psychiatric  History: See admission H&P Social History:  Social History   Substance and Sexual Activity  Alcohol Use Yes   Comment: socially      Social History   Substance and Sexual Activity  Drug Use No    Social History   Socioeconomic History  . Marital status: Divorced    Spouse name: Not on file  . Number of children: 2  . Years of education: Not on file  . Highest education level: Not on file  Occupational History  . Occupation: not working at present    Employer: [employer]  . Financial resource strain: Not on file  . Food insecurity    Worry: Not on file    Inability: Not on file  . Transportation needs    Medical: Not on file    Non-medical: Not on file  Tobacco Use  . Smoking status: Never Smoker  . Smokeless tobacco: Never Used  Substance and Sexual Activity  . Alcohol use: Yes    Comment: socially   . Drug use: No  . Sexual activity: Yes    Birth control/protection: Post-menopausal  Lifestyle  . Physical activity    Days per week: Not on file    Minutes per session: Not on file  . Stress: Not on file  Relationships  . Social Herbalist on phone: Not on file    Gets together: Not on file    Attends religious service: Not on file    Active member of club or organization: Not on file    Attends meetings of clubs or organizations: Not on file    Relationship status: Not on file  Other Topics Concern  . Not on file  Social History Narrative   Born in [place], lives w/ father    2 child, lost one    Additional Social History:                         Sleep: Good  Appetite:  Fair  Current Medications: Current Facility-Administered Medications  Medication Dose Route Frequency Provider  Last Rate Last Dose  . acetaminophen (TYLENOL) tablet 650 mg  650 mg Oral Q6H PRN [name], NP   650 mg at 05/06/19 0711  . alum & mag hydroxide-simeth (MAALOX/MYLANTA) 200-200-20 MG/5ML suspension 30 mL  30 mL Oral Q4H PRN [name], NP      . benztropine (COGENTIN) tablet 0.5 mg  0.5 mg Oral BID [name], MD   0.5 mg at 05/06/19 [id number]  . carvedilol (COREG) tablet 12.5 mg  12.5 mg Oral BID WC [name], MD   12.5 mg at 05/06/19 0820  . hydrOXYzine (ATARAX/VISTARIL) tablet 25 mg  25 mg Oral TID PRN [name], NP   25 mg at 05/04/19 1825  . lisinopril (ZESTRIL) tablet 10 mg  10 mg Oral BID [name], MD   10 mg at 05/06/19 0820  . magnesium hydroxide (MILK OF MAGNESIA) suspension 30 mL  30 mL Oral Daily PRN [name], NP      . ondansetron [facility]) tablet 4 mg  4 mg Oral Q8H PRN [name], NP      . risperiDONE (RISPERDAL) tablet 3 mg  3 mg Oral BID [name], MD   3 mg at 05/06/19 [id number]  . temazepam (RESTORIL) capsule 30 mg  30 mg Oral QHS [name], MD   30 mg at 05/05/19 2123  . traZODone (DESYREL) tablet 50 mg  50 mg Oral QHS PRN [name], NP        Lab Results: No results found for this or any previous visit (from the past 59 hour(s)).  Blood Alcohol level:  Lab Results  Component Value Date   ETH <10 05/03/2019   ETH <10 [id number]    Metabolic Disorder Labs: Lab Results  Component Value Date   HGBA1C 5.9 (H) 10/09/2018   MPG 122.63 10/09/2018   MPG 117 (H) 04/08/2012   No results found for: PROLACTIN Lab Results  Component Value Date   CHOL 199 10/09/2018   TRIG 121 10/09/2018   HDL 57 10/09/2018   CHOLHDL 3.5 10/09/2018   VLDL 24 10/09/2018   LDLCALC 118 (H) 10/09/2018   LDLCALC 123 (H) 06/29/2013    Physical Findings: AIMS: Facial and Oral Movements Muscles of Facial Expression: None, normal Lips and Perioral Area: None, normal Jaw: None, normal Tongue: None, normal,Extremity  Movements Upper (arms, wrists, hands, fingers): None, normal Lower (legs, knees, ankles, toes): None, normal, Trunk Movements Neck, shoulders, hips: None, normal, Overall Severity Severity of abnormal movements (highest score from questions above): None, normal Incapacitation due to abnormal movements: None, normal Patient's awareness of abnormal movements (rate only patient's report): No Awareness, Dental Status Current problems with teeth and/or dentures?: No Does patient usually wear dentures?: No  CIWA:    COWS:     Musculoskeletal: Strength & Muscle Tone: within normal limits Gait & Station: normal Patient leans: N/A  Psychiatric Specialty Exam: Physical Exam  Nursing note and vitals reviewed. Constitutional: She is oriented to person, place, and time. She appears well-developed and well-nourished.  HENT:  Head: Normocephalic and atraumatic.  Respiratory: Effort normal.  Neurological: She is alert and oriented to person, place, and time.    ROS  Blood pressure 109/68, pulse 70, temperature 98.1 F (36.7 C), temperature source Oral, resp. rate 18, height 5' 0.5" (1.537 m), weight 64.4 kg, SpO2 99 %.Body mass index is 27.28 kg/m.  General Appearance: Disheveled  Eye Contact:  Fair  Speech:  Normal Rate  Volume:  Decreased  Mood:  Depressed  Affect:  Congruent  Thought Process:  Coherent and Descriptions of Associations: Tangential  Orientation:  Full (Time, Place, and Person)  Thought Content:  Logical and Rumination  Suicidal Thoughts:  No  Homicidal Thoughts:  No  Memory:  Immediate;   Fair Recent;   Fair Remote;   Fair  Judgement:  Impaired  Insight:  Lacking  Psychomotor Activity:  Increased  Concentration:  Concentration: Fair and Attention Span: Fair  Recall:  [employer] of Knowledge:  Fair  Language:  Good  Akathisia:  Negative  Handed:  Right  AIMS (if indicated):  Assets:  Desire for Improvement Resilience  ADL's:  Intact  Cognition:  WNL   Sleep:  Number of Hours: 6.75     Treatment Plan Summary: Daily contact with patient to assess and evaluate symptoms and progress in treatment, Medication management and Plan : Patient is seen and examined.  Patient is a 58 year old female with the above-stated past psychiatric history who is seen in follow-up.  Diagnosis: #1 bipolar disorder, most recently depressed, severe, without psychotic features, #2 hypertension, #3 pacemaker  Patient is seen in follow-up.  She is rather sad today, but with good reason.  This is approximately the anniversary of the death of her daughter.  She was noncompliant with her medications which probably makes things even worse.  She is taking the Cogentin, carvedilol, hydroxyzine, lisinopril, Zofran, Risperdal and temazepam without difficulty at this point.  Her creatinine was mildly elevated on admission, and her blood sugar was significantly elevated.  For what ever reason hemoglobin A1c was not obtained.  We will order that today, and get daily blood sugars.  She also has a history of left bundle branch block, previous syncope requiring temporary pacemaker placement.  TSH was also not obtained, and that will be ordered as well.  Her creatinine was mildly elevated 1.25 as well, and we will repeat her chemistries on 9/7.  She also has not had an EKG, but we will go on and obtain to just to make sure that her rhythm is paced. 1.  Continue Cogentin 0.5 mg p.o. twice daily for side effects of Risperdal. 2.  Continue carvedilol 12.5 mg p.o. twice daily for hypertension and rate control. 3.  Continue hydroxyzine 25 mg p.o. 3 times daily as needed anxiety. 4.  Continue lisinopril 10 mg p.o. twice daily for hypertension. 5.  Continue Zofran 4 mg p.o. every 8 hours as needed nausea. 6.  Continue Risperdal 3 mg p.o. twice daily for mood stability. 7.  Continue temazepam 30 mg p.o. nightly for insomnia. 8.  Continue trazodone 50 mg p.o. nightly as needed insomnia. 9.  Get  hemoglobin A1c and TSH given her history. 10.  Daily blood sugars initially. 11.  Repeat chemistries on 05/08/2019. 12.  EKG to confirm paced rhythm. 13.  Disposition planning-in progress. Sharma Covert, MD 05/06/2019, 10:04 AM
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
## 2019-05-06 NOTE — BHH Suicide Risk Assessment (Signed)
[facility] INPATIENT:  Family/Significant Other Suicide Prevention Education  Suicide Prevention Education:  Patient Refusal for Family/Significant Other Suicide Prevention Education: The patient Nancy Drake has refused to provide written consent for family/significant other to be provided Family/Significant Other Suicide Prevention Education during admission and/or prior to discharge.  Physician notified.  SPE completed with pt, as pt refused to consent to family contact. SPI pamphlet provided to pt and pt was encouraged to share information with support network, ask questions, and talk about any concerns relating to SPE. Pt denies access to guns/firearms and verbalized understanding of information provided. Mobile Crisis information also provided to pt.   Avelina Laine, MSW, LCSW 05/06/2019, 12:56 PM

## 2019-05-06 NOTE — Progress Notes (Signed)
Adult Psychoeducational Group Note  Date:  05/06/2019 Time:  12:45 AM  Group Topic/Focus:  Wrap-Up Group:   The focus of this group is to help patients review their daily goal of treatment and discuss progress on daily workbooks.  Participation Level:  Active  Participation Quality:  Appropriate  Affect:  Appropriate  Cognitive:  Appropriate  Insight: Appropriate  Engagement in Group:  Developing/Improving  Modes of Intervention:  Discussion  Additional Comments: Pt stated her goal for today was to focus on her treatment plan and to talk with her doctor. Pt stated she felt she accomplished her goal today by talking with her doctor about her treatment plan. Pt stated her relationship with her family has improved since she was admitted here. Pt stated she felt better about herself today. Pt rated her overall day an 8 out of 10. Pt stated her appetite was pretty good today. Pt stated her goal for tonight is to get some rest. Pt did complain of pain in her legs.  Pt nurse was made aware of the situation. Pt stated she would alert staff if anything changes.   Candy Sledge 05/06/2019, 12:45 AM

## 2019-05-07 NOTE — Progress Notes (Signed)
Adult Psychoeducational Group Note  Date:  05/07/2019 Time:  9:10 PM  Group Topic/Focus:  Wrap-Up Group:   The focus of this group is to help patients review their daily goal of treatment and discuss progress on daily workbooks.  Participation Level:  Did Not Attend  Participation Quality:  Did not attend  Affect:  Did not attend  Cognitive:  Did not attend  Insight: None  Engagement in Group:  Did not attend  Modes of Intervention:  Did not attend  Additional Comments:  Pt did not attend evening wrap up group tonight.  Candy Sledge 05/07/2019, 9:10 PM

## 2019-05-07 NOTE — Progress Notes (Signed)
[facility] NOVEL CORONAVIRUS (COVID-19) DAILY CHECK-OFF SYMPTOMS - answer yes or no to each - every day NO YES  Have you had a fever in the past 24 hours?  . Fever (Temp > 37.80C / 100F) X   Have you had any of these symptoms in the past 24 hours? . New Cough .  Sore Throat  .  Shortness of Breath .  Difficulty Breathing .  Unexplained Body Aches   X   Have you had any one of these symptoms in the past 24 hours not related to allergies?   . Runny Nose .  Nasal Congestion .  Sneezing   X   If you have had runny nose, nasal congestion, sneezing in the past 24 hours, has it worsened?  X   EXPOSURES - check yes or no X   Have you traveled outside the state in the past 14 days?  X   Have you been in contact with someone with a confirmed diagnosis of COVID-19 or PUI in the past 14 days without wearing appropriate PPE?  X   Have you been living in the same home as a person with confirmed diagnosis of COVID-19 or a PUI (household contact)?    X   Have you been diagnosed with COVID-19?    X              What to do next: Answered NO to all: Answered YES to anything:   Proceed with unit schedule Follow the [facility] Inpatient Flowsheet.   

## 2019-05-07 NOTE — Progress Notes (Signed)
[facility] MD Progress Note  05/07/2019 9:39 AM ZENITA SYFERT  MRN:  [id number] Subjective:  Patient is a 58 year old female with a past psychiatric history significant for bipolar disorder who presented on 05/05/2019 under involuntary commitment secondary to walking around in public in a bath towel, yelling at her neighbors, and uncooperative with police when they came to investigate the situation.  The patient's last psychiatric admission at our facility was in February 2020, and she was discharged on Abilify, Risperdal, Tegretol, lorazepam and trazodone.  Objective: Patient is seen and examined.  Patient is a 58 year old female with the above-stated past psychiatric history who is seen in follow-up.  Patient seems to be doing better today.  She is not tearful or sad.  She does not bring up the issue with her daughter death.  She is less ruminative about that.  She stated her sister is coming down from the [place] to help take care of her dad.  She stated that she is a bit fatigued and sleepy, but much of that is because she is bored.  She denied any auditory or visual hallucinations.  She denied any suicidal or homicidal ideation.  No racing thoughts, no pressured speech.  Her mood symptoms seem to be improved.  Her blood pressure stable, she is afebrile.  She slept 6.25 hours last night.  Her blood sugar this morning is 106.  Her hemoglobin A1c is 5.7.  TSH is 4.168.  Her EKG showed a sinus rhythm with PVCs.  Principal Problem: <principal problem not specified> Diagnosis: Active Problems:   Bipolar I disorder, current or most recent episode manic, with psychotic features ([place])   MDD (major depressive disorder), recurrent, severe, with psychosis ([place])  Total Time spent with patient: 15 minutes  Past Psychiatric History: See admission H&P  Past Medical History:  Past Medical History:  Diagnosis Date  . Anxiety    lost a child , sees psych  . Biventricular cardiac pacemaker -Medtronic    DOI  2013  . Cardiomyopathy nonischemic    dx after a syncope 10-2010  . Complete heart block ([place]) 03-2012  . Mole of skin    bx 05-2013  . Thyroid disease    used to see Dr Debbora Presto    Past Surgical History:  Procedure Laterality Date  . BI-VENTRICULAR PACEMAKER INSERTION N/A 04/11/2012   Procedure: BI-VENTRICULAR PACEMAKER INSERTION (CRT-P);  Surgeon: Deboraha Sprang, MD;  Location: [facility] CATH LAB;  Service: Cardiovascular;  Laterality: N/A;  . CESAREAN SECTION    . COLONOSCOPY     2010  . LEAD REVISION N/A 04/12/2012   Procedure: LEAD REVISION;  Surgeon: Evans Lance, MD;  Location[facility] CATH LAB;  Service: Cardiovascular;  Laterality: N/A;  . PACEMAKER INSERTION  03-2012  . TEMPORARY PACEMAKER INSERTION Right 04/08/2012   Procedure: TEMPORARY PACEMAKER INSERTION;  Surgeon: Burnell Blanks, MD;  Location: [facility] CATH LAB;  Service: Cardiovascular;  Laterality: Right;   Family History:  Family History  Problem Relation Age of Onset  . Other Other        uncle with pacemaker  . Heart disease Other        PACERMAKER  . Breast cancer Other        cousin  . Diabetes Mother   . Cancer Father        prostate cancer  . Colon cancer Neg Hx    Family Psychiatric  History: See admission H&P Social History:  Social History   Substance and Sexual Activity  Alcohol Use Yes   Comment: socially      Social History   Substance and Sexual Activity  Drug Use No    Social History   Socioeconomic History  . Marital status: Divorced    Spouse name: Not on file  . Number of children: 2  . Years of education: Not on file  . Highest education level: Not on file  Occupational History  . Occupation: not working at present    Employer: [employer]  . Financial resource strain: Not on file  . Food insecurity    Worry: Not on file    Inability: Not on file  . Transportation needs    Medical: Not on file    Non-medical: Not on file  Tobacco Use  . Smoking status: Never Smoker   . Smokeless tobacco: Never Used  Substance and Sexual Activity  . Alcohol use: Yes    Comment: socially   . Drug use: No  . Sexual activity: Yes    Birth control/protection: Post-menopausal  Lifestyle  . Physical activity    Days per week: Not on file    Minutes per session: Not on file  . Stress: Not on file  Relationships  . Social Herbalist on phone: Not on file    Gets together: Not on file    Attends religious service: Not on file    Active member of club or organization: Not on file    Attends meetings of clubs or organizations: Not on file    Relationship status: Not on file  Other Topics Concern  . Not on file  Social History Narrative   Born in [place], lives w/ father    2 child, lost one    Additional Social History:                         Sleep: Good  Appetite:  Good  Current Medications: Current Facility-Administered Medications  Medication Dose Route Frequency Provider Last Rate Last Dose  . acetaminophen (TYLENOL) tablet 650 mg  650 mg Oral Q6H PRN [name], NP   650 mg at 05/06/19 0711  . alum & mag hydroxide-simeth (MAALOX/MYLANTA) 200-200-20 MG/5ML suspension 30 mL  30 mL Oral Q4H PRN [name], NP      . benztropine (COGENTIN) tablet 0.5 mg  0.5 mg Oral BID [name], MD   0.5 mg at 05/07/19 0842  . carvedilol (COREG) tablet 12.5 mg  12.5 mg Oral BID WC [name], MD   12.5 mg at 05/07/19 0843  . hydrOXYzine (ATARAX/VISTARIL) tablet 25 mg  25 mg Oral TID PRN [name], NP   25 mg at 05/04/19 1825  . lisinopril (ZESTRIL) tablet 10 mg  10 mg Oral BID [name], MD   10 mg at 05/07/19 0842  . magnesium hydroxide (MILK OF MAGNESIA) suspension 30 mL  30 mL Oral Daily PRN [name], NP      . ondansetron [facility]) tablet 4 mg  4 mg Oral Q8H PRN [name], NP      . risperiDONE (RISPERDAL) tablet 3 mg  3 mg Oral BID [name], MD   3 mg at 05/07/19 0843  . temazepam  (RESTORIL) capsule 30 mg  30 mg Oral QHS [name], MD   30 mg at 05/06/19 2121  . traZODone (DESYREL) tablet 50 mg  50 mg Oral QHS PRN [name], NP  Lab Results:  Results for orders placed or performed during the hospital encounter of 05/04/19 (from the past 48 hour(s))  Hemoglobin A1c     Status: Abnormal   Collection Time: 05/06/19  6:14 PM  Result Value Ref Range   Hgb A1c MFr Bld 5.7 (H) 4.8 - 5.6 %    Comment: (NOTE) Pre diabetes:          5.7%-6.4% Diabetes:              >6.4% Glycemic control for   <7.0% adults with diabetes    Mean Plasma Glucose 116.89 mg/dL    Comment: Performed at [facility] [address], [place], [place] [address]  TSH     Status: None   Collection Time: 05/06/19  6:14 PM  Result Value Ref Range   TSH 4.168 0.350 - 4.500 uIU/mL    Comment: Performed by a 3rd Generation assay with a functional sensitivity of <=0.01 uIU/mL. Performed at [facility], [place] [address], [place], [place] [address]   Glucose, capillary     Status: Abnormal   Collection Time: 05/06/19  9:10 PM  Result Value Ref Range   Glucose-Capillary 106 (H) 70 - 99 mg/dL    Blood Alcohol level:  Lab Results  Component Value Date   ETH <10 05/03/2019   ETH <10 [id number]    Metabolic Disorder Labs: Lab Results  Component Value Date   HGBA1C 5.7 (H) 05/06/2019   MPG 116.89 05/06/2019   MPG 122.63 10/09/2018   No results found for: PROLACTIN Lab Results  Component Value Date   CHOL 199 10/09/2018   TRIG 121 10/09/2018   HDL 57 10/09/2018   CHOLHDL 3.5 10/09/2018   VLDL 24 10/09/2018   LDLCALC 118 (H) 10/09/2018   LDLCALC 123 (H) 06/29/2013    Physical Findings: AIMS: Facial and Oral Movements Muscles of Facial Expression: None, normal Lips and Perioral Area: None, normal Jaw: None, normal Tongue: None, normal,Extremity Movements Upper (arms, wrists, hands, fingers): None, normal Lower (legs, knees, ankles, toes):  None, normal, Trunk Movements Neck, shoulders, hips: None, normal, Overall Severity Severity of abnormal movements (highest score from questions above): None, normal Incapacitation due to abnormal movements: None, normal Patient's awareness of abnormal movements (rate only patient's report): No Awareness, Dental Status Current problems with teeth and/or dentures?: No Does patient usually wear dentures?: No  CIWA:    COWS:     Musculoskeletal: Strength & Muscle Tone: within normal limits Gait & Station: normal Patient leans: N/A  Psychiatric Specialty Exam: Physical Exam  Nursing note and vitals reviewed. Constitutional: She is oriented to person, place, and time. She appears well-developed and well-nourished.  HENT:  Head: Normocephalic and atraumatic.  Respiratory: Effort normal.  Neurological: She is alert and oriented to person, place, and time.    ROS  Blood pressure 110/72, pulse 70, temperature 98.4 F (36.9 C), temperature source Oral, resp. rate 18, height 5' 0.5" (1.537 m), weight 64.4 kg, SpO2 99 %.Body mass index is 27.28 kg/m.  General Appearance: Disheveled  Eye Contact:  Fair  Speech:  Normal Rate  Volume:  Normal  Mood:  Euthymic  Affect:  Congruent  Thought Process:  Coherent and Descriptions of Associations: Intact  Orientation:  Full (Time, Place, and Person)  Thought Content:  Logical  Suicidal Thoughts:  No  Homicidal Thoughts:  No  Memory:  Immediate;   Fair Recent;   Fair Remote;   Fair  Judgement:  Intact  Insight:  Fair  Psychomotor Activity:  Normal  Concentration:  Concentration: Fair and Attention Span: Fair  Recall:  [employer] of Knowledge:  Fair  Language:  Good  Akathisia:  Negative  Handed:  Right  AIMS (if indicated):     Assets:  Desire for Improvement Resilience  ADL's:  Intact  Cognition:  WNL  Sleep:  Number of Hours: 6.25     Treatment Plan Summary: Daily contact with patient to assess and evaluate symptoms and  progress in treatment, Medication management and Plan : Patient is seen and examined.  Patient is a 58 year old female with the above-stated past psychiatric history who is seen in follow-up.   Diagnosis: #1 bipolar disorder, most recently depressed, severe, without psychotic features, #2 hypertension, #3 pacemaker  Patient is seen in follow-up.  She is better today.  She is not tearful and her mood is improved.  She is sleeping well.  No racing thoughts or pressured speech.  No auditory or visual hallucinations.  No suicidal or homicidal ideation.  No change in her medications today.  It does appear that she does have an elevated hemoglobin A1c and borderline diabetes.  Her blood sugar this morning stable.  I will discuss this with the patient and make sure she follows up with her primary care provider with regard to this.  She has chemistries pending tomorrow morning.  And her most recent EKG showed her paced rhythm. 1.  Continue Cogentin 0.5 mg p.o. twice daily for side effects of Risperdal. 2.  Continue carvedilol 12.5 mg p.o. twice daily for hypertension and rate control. 3.  Continue hydroxyzine 25 mg p.o. 3 times daily as needed anxiety. 4.  Continue lisinopril 10 mg p.o. twice daily for hypertension. 5.  Continue Zofran 4 mg p.o. every 8 hours as needed nausea. 6.  Continue Risperdal 3 mg p.o. twice daily for mood stability. 7.  Continue temazepam 30 mg p.o. nightly for insomnia. 8.  Continue trazodone 50 mg p.o. nightly as needed insomnia. 9.    Her A1c suggests borderline diabetes which should be able to be diet controlled. 10.  Daily blood sugars  11.  Repeat chemistries on 05/08/2019. 12.  Disposition planning-in progress.  Sharma Covert, MD 05/07/2019, 9:39 AM

## 2019-05-07 NOTE — BHH Group Notes (Signed)
[facility] LCSW Group Therapy Note   Date and Time: 05/07/2019 @ 2pm  Type of Therapy and Topic:  Group Therapy:  Trust and Honesty    Participation Level:  [facility]HH PARTICIPATION LEVEL: Minimal   Mood: Pleasant    Description of Group:     In this group patients will be asked to explore the value of being honest.  Patients will be guided to discuss their thoughts, feelings, and behaviors related to honesty and trusting in others. Patients will process together how trust and honesty relate to forming relationships with peers, family members, and self. Each patient will be challenged to identify and express feelings of being vulnerable. Patients will discuss reasons why people are dishonest and identify alternative outcomes if one was truthful (to self or others). This group will be process-oriented, with patients participating in exploration of their own experiences, giving and receiving support, and processing challenge from other group members.     Therapeutic Goals:  1.  Patient will identify why honesty is important to relationships and how honesty overall affects relationships.  2.  Patient will identify a situation where they lied or were lied too and the  feelings, thought process, and behaviors surrounding the situation  3.  Patient will identify the meaning of being vulnerable, how that feels, and how that correlates to being honest with self and others.  4.  Patient will identify situations where they could have told the truth, but instead lied and explain reasons of dishonesty.     Summary of Patient Progress:       Patient came to group for the first 15 minutes of group. Patient discussed having trust and honesty when it comes to her medical professional history. Patient then left the group.        Therapeutic Modalities:    Cognitive Behavioral Therapy  Solution Focused Therapy  Motivational Interviewing  Brief Therapy   Ardelle Anton, MSW, LCSW

## 2019-05-07 NOTE — Progress Notes (Signed)
Writer spoke with patient 1:1 and she reminded me that today is her daughters birthday. She has been in the dayroom briefly but mostly resting in her room. She is pleasant and cooperative. Support given and safety  Maintained with 15 min checks.

## 2019-05-07 NOTE — Progress Notes (Signed)
D. Pt is friendly upon approach-reports improving mood- is visible in the milieu interacting well with peers.   Per pt's self inventory, pt rated her depression, hopelessness and anxiety a 0/1/0, respectively. Pt writes that her goal today is "to relax and meditation". Pt currently denies SI/HI and AVH A. Labs and vitals monitored. Pt compliant with medications. Pt supported emotionally and encouraged to express concerns and ask questions.   R. Pt remains safe with 15 minute checks. Will continue POC.

## 2019-05-08 LAB — BASIC METABOLIC PANEL
Anion gap: 6 (ref 5–15)
BUN: 15 mg/dL (ref 6–20)
CO2: 29 mmol/L (ref 22–32)
Calcium: 9.5 mg/dL (ref 8.9–10.3)
Chloride: 107 mmol/L (ref 98–111)
Creatinine, Ser: 0.77 mg/dL (ref 0.44–1.00)
GFR calc Af Amer: >60 mL/min (ref >60)
GFR calc non Af Amer: >60 mL/min (ref >60)
Glucose, Bld: 99 mg/dL (ref 70–99)
Potassium: 4 mmol/L (ref 3.5–5.1)
Sodium: 142 mmol/L (ref 135–145)

## 2019-05-08 LAB — GLUCOSE, CAPILLARY: Glucose-Capillary: 80 mg/dL (ref 70–99)

## 2019-05-08 NOTE — Progress Notes (Signed)
Patient ID: MIKHAIL GEBEL, female   DOB: 1960/12/23, 58 y.o.   MRN: [id number]  [place] NOVEL CORONAVIRUS (COVID-19) DAILY CHECK-OFF SYMPTOMS - answer yes or no to each - every day NO YES  Have you had a fever in the past 24 hours?  . Fever (Temp > 37.80C / 100F) X   Have you had any of these symptoms in the past 24 hours? . New Cough .  Sore Throat  .  Shortness of Breath .  Difficulty Breathing .  Unexplained Body Aches   X   Have you had any one of these symptoms in the past 24 hours not related to allergies?   . Runny Nose .  Nasal Congestion .  Sneezing   X   If you have had runny nose, nasal congestion, sneezing in the past 24 hours, has it worsened?  X   EXPOSURES - check yes or no X   Have you traveled outside the state in the past 14 days?  X   Have you been in contact with someone with a confirmed diagnosis of COVID-19 or PUI in the past 14 days without wearing appropriate PPE?  X   Have you been living in the same home as a person with confirmed diagnosis of COVID-19 or a PUI (household contact)?    X   Have you been diagnosed with COVID-19?    X              What to do next: Answered NO to all: Answered YES to anything:   Proceed with unit schedule Follow the [facility] Inpatient Flowsheet.

## 2019-05-08 NOTE — Progress Notes (Signed)
[facility] MD Progress Note  05/08/2019 10:53 AM Nancy Drake  MRN:  [id number] Subjective:    Patient is much improved her mood is more contained she still lacks insight into her hospitalization stating "I was just celebrating" without the insight of knowing that she was manic at any rate she is coherent today she denies wanting to harm self or others her mood does seem stable and she is responding to meds.  No EPS or TD probable discharge tomorrow Principal Problem: Bipolar manic with psychosis Diagnosis: Active Problems:   Bipolar I disorder, current or most recent episode manic, with psychotic features ([place])   MDD (major depressive disorder), recurrent, severe, with psychosis ([place])  Total Time spent with patient: 20 minutes  Past Psychiatric History: Long history of stability with Risperdal  Past Medical History:  Past Medical History:  Diagnosis Date  . Anxiety    lost a child , sees psych  . Biventricular cardiac pacemaker -[employer]    DOI 2013  . Cardiomyopathy nonischemic    dx after a syncope 10-2010  . Complete heart block ([place]) 03-2012  . Mole of skin    bx 05-2013  . Thyroid disease    used to see Dr Debbora Presto    Past Surgical History:  Procedure Laterality Date  . BI-VENTRICULAR PACEMAKER INSERTION N/A 04/11/2012   Procedure: BI-VENTRICULAR PACEMAKER INSERTION (CRT-P);  Surgeon: Deboraha Sprang, MD;  Location: [facility] CATH LAB;  Service: Cardiovascular;  Laterality: N/A;  . CESAREAN SECTION    . COLONOSCOPY     2010  . LEAD REVISION N/A 04/12/2012   Procedure: LEAD REVISION;  Surgeon: Evans Lance, MD;  Location[facility] CATH LAB;  Service: Cardiovascular;  Laterality: N/A;  . PACEMAKER INSERTION  03-2012  . TEMPORARY PACEMAKER INSERTION Right 04/08/2012   Procedure: TEMPORARY PACEMAKER INSERTION;  Surgeon: Burnell Blanks, MD;  Location: [facility] CATH LAB;  Service: Cardiovascular;  Laterality: Right;   Family History:  Family History  Problem Relation Age of Onset  .  Other Other        uncle with pacemaker  . Heart disease Other        PACERMAKER  . Breast cancer Other        cousin  . Diabetes Mother   . Cancer Father        prostate cancer  . Colon cancer Neg Hx    Family Psychiatric  History: No new data Social History:  Social History   Substance and Sexual Activity  Alcohol Use Yes   Comment: socially      Social History   Substance and Sexual Activity  Drug Use No    Social History   Socioeconomic History  . Marital status: Divorced    Spouse name: Not on file  . Number of children: 2  . Years of education: Not on file  . Highest education level: Not on file  Occupational History  . Occupation: not working at present    Employer: [employer]  . Financial resource strain: Not on file  . Food insecurity    Worry: Not on file    Inability: Not on file  . Transportation needs    Medical: Not on file    Non-medical: Not on file  Tobacco Use  . Smoking status: Never Smoker  . Smokeless tobacco: Never Used  Substance and Sexual Activity  . Alcohol use: Yes    Comment: socially   . Drug use: No  . Sexual activity:  Yes    Birth control/protection: Post-menopausal  Lifestyle  . Physical activity    Days per week: Not on file    Minutes per session: Not on file  . Stress: Not on file  Relationships  . Social Herbalist on phone: Not on file    Gets together: Not on file    Attends religious service: Not on file    Active member of club or organization: Not on file    Attends meetings of clubs or organizations: Not on file    Relationship status: Not on file  Other Topics Concern  . Not on file  Social History Narrative   Born in [place], lives w/ father    2 child, lost one    Additional Social History:                         Sleep: Good  Appetite:  Good  Current Medications: Current Facility-Administered Medications  Medication Dose Route Frequency Provider Last Rate Last  Dose  . acetaminophen (TYLENOL) tablet 650 mg  650 mg Oral Q6H PRN [name], NP   650 mg at 05/06/19 0711  . alum & mag hydroxide-simeth (MAALOX/MYLANTA) 200-200-20 MG/5ML suspension 30 mL  30 mL Oral Q4H PRN [name], NP      . benztropine (COGENTIN) tablet 0.5 mg  0.5 mg Oral BID [name], MD   0.5 mg at 05/08/19 0751  . carvedilol (COREG) tablet 12.5 mg  12.5 mg Oral BID WC [name], MD   12.5 mg at 05/08/19 0751  . hydrOXYzine (ATARAX/VISTARIL) tablet 25 mg  25 mg Oral TID PRN [name], NP   25 mg at 05/04/19 1825  . lisinopril (ZESTRIL) tablet 10 mg  10 mg Oral BID [name], MD   10 mg at 05/08/19 0751  . magnesium hydroxide (MILK OF MAGNESIA) suspension 30 mL  30 mL Oral Daily PRN [name], NP      . ondansetron [facility]) tablet 4 mg  4 mg Oral Q8H PRN [name], NP      . risperiDONE (RISPERDAL) tablet 3 mg  3 mg Oral BID [name], MD   3 mg at 05/08/19 0751  . temazepam (RESTORIL) capsule 30 mg  30 mg Oral QHS [name], MD   30 mg at 05/07/19 2046  . traZODone (DESYREL) tablet 50 mg  50 mg Oral QHS PRN [name], NP        Lab Results:  Results for orders placed or performed during the hospital encounter of 05/04/19 (from the past 48 hour(s))  Hemoglobin A1c     Status: Abnormal   Collection Time: 05/06/19  6:14 PM  Result Value Ref Range   Hgb A1c MFr Bld 5.7 (H) 4.8 - 5.6 %    Comment: (NOTE) Pre diabetes:          5.7%-6.4% Diabetes:              >6.4% Glycemic control for   <7.0% adults with diabetes    Mean Plasma Glucose 116.89 mg/dL    Comment: Performed at [facility], [place] [address] [place], [place] [address]  TSH     Status: None   Collection Time: 05/06/19  6:14 PM  Result Value Ref Range   TSH 4.168 0.350 - 4.500 uIU/mL    Comment: Performed by a 3rd Generation assay with a functional sensitivity of <=  0.01 uIU/mL. Performed at [facility], [place] [address] [place], [place] [address]   Glucose, capillary     Status: Abnormal   Collection Time: 05/06/19  9:10 PM  Result Value Ref Range   Glucose-Capillary 106 (H) 70 - 99 mg/dL  Glucose, capillary     Status: None   Collection Time: 05/08/19  5:59 AM  Result Value Ref Range   Glucose-Capillary 80 70 - 99 mg/dL  Basic metabolic panel     Status: None   Collection Time: 05/08/19  6:43 AM  Result Value Ref Range   Sodium 142 135 - 145 mmol/L   Potassium 4.0 3.5 - 5.1 mmol/L   Chloride 107 98 - 111 mmol/L   CO2 29 22 - 32 mmol/L   Glucose, Bld 99 70 - 99 mg/dL   BUN 15 6 - 20 mg/dL   Creatinine, Ser 0.77 0.44 - 1.00 mg/dL   Calcium 9.5 8.9 - 10.3 mg/dL   GFR calc non Af Amer >60 >60 mL/min   GFR calc Af Amer >60 >60 mL/min   Anion gap 6 5 - 15    Comment: Performed at [facility], [place] [address], [place], [place] [address]    Blood Alcohol level:  Lab Results  Component Value Date   [facility] <10 05/03/2019   ETH <10 [id number]    Metabolic Disorder Labs: Lab Results  Component Value Date   HGBA1C 5.7 (H) 05/06/2019   MPG 116.89 05/06/2019   MPG 122.63 10/09/2018   No results found for: PROLACTIN Lab Results  Component Value Date   CHOL 199 10/09/2018   TRIG 121 10/09/2018   HDL 57 10/09/2018   CHOLHDL 3.5 10/09/2018   VLDL 24 10/09/2018   LDLCALC 118 (H) 10/09/2018   LDLCALC 123 (H) 06/29/2013    Physical Findings: AIMS: Facial and Oral Movements Muscles of Facial Expression: None, normal Lips and Perioral Area: None, normal Jaw: None, normal Tongue: None, normal,Extremity Movements Upper (arms, wrists, hands, fingers): None, normal Lower (legs, knees, ankles, toes): None, normal, Trunk Movements Neck, shoulders, hips: None, normal, Overall Severity Severity of abnormal movements (highest score from questions above): None, normal Incapacitation due to abnormal movements: None, normal Patient's awareness of abnormal movements (rate only  patient's report): No Awareness, Dental Status Current problems with teeth and/or dentures?: No Does patient usually wear dentures?: No  CIWA:    COWS:     Musculoskeletal: Strength & Muscle Tone: within normal limits Gait & Station: normal Patient leans: N/A  Psychiatric Specialty Exam: Physical Exam  ROS  Blood pressure 126/89, pulse 72, temperature 98 F (36.7 C), temperature source Oral, resp. rate 18, height 5' 0.5" (1.537 m), weight 64.4 kg, SpO2 99 %.Body mass index is 27.28 kg/m.  General Appearance: Casual  Eye Contact:  Good  Speech:  Clear and Coherent  Volume:  Normal  Mood:  Euthymic  Affect:  Restricted  Thought Process:  Goal Directed, Linear and Descriptions of Associations: Circumstantial  Orientation:  Full (Time, Place, and Person)  Thought Content:  Tangential  Suicidal Thoughts:  No  Homicidal Thoughts:  No  Memory:  Remote;   Fair  Judgement:  Fair  Insight:  Shallow  Psychomotor Activity:  Normal  Concentration:  Concentration: Fair and Attention Span: Fair  Recall:  [employer] of Knowledge:  Fair  Language: Normal rate tone and vocabulary  Akathisia:  Negative  Handed:  Right  AIMS (if indicated):     Assets:  Physical Health Resilience  ADL's:  Intact  Cognition:  WNL  Sleep:  Number of Hours: 6     Treatment Plan Summary: Daily contact with patient to assess and evaluate symptoms and progress in treatment and Medication management continue current precautions continue to use Risperdal as her main mood stabilizer therapy for bipolar disorder continue to engage in cognitive and reality based therapy probable discharge tomorrow if stable  Wadell Craddock, MD 05/08/2019, 10:53 AM

## 2019-05-08 NOTE — Progress Notes (Signed)
Recreation Therapy Notes  Date: 9.7.20 Time: 1000 Location: 500 [address] Dayroom  Group Topic: Wellness  Goal Area(s) Addresses:  Patient will define components of whole wellness. Patient will verbalize benefit of whole wellness.  Behavioral Response: Engaged  Intervention: Music  Activity:  Exercise.  LRT led the group in a series of stretches.  After stretching, each member of the group got the opportunity to lead the group in an exercise of their choice.  The was to complete at least 30 minutes of exercise.  Pt could get water if needed and were encouraged to pay attention to any pains or strains they made feel.  Education: Wellness, Dentist.   Education Outcome: Acknowledges education/In group clarification offered/Needs additional education.   Clinical Observations/Feedback:  Pt appeared to have just woken up. Pt completed the exercises and became brighter as group went on.  Pt was also able to lead the group in some exercises as well.     Victorino Sparrow, LRT/CTRS         Ria Comment, Navina Wohlers A 05/08/2019 11:01 AM

## 2019-05-08 NOTE — Progress Notes (Signed)
D: Pt denies SI/HI/AVH. Pt is pleasant and cooperative. Pt stated she was doing good.  A: Pt was offered support and encouragement.  Pt was encourage to attend groups. Q 15 minute checks were done for safety.  R: safety maintained on unit.

## 2019-05-08 NOTE — Progress Notes (Signed)
Patient has been in her room more tonight and briefly in the dayroom. She reports that her day was good and voiced no complaints. She denies si/hi/ auditory and visual hallucinations. Safety maintained with 15 min checks.

## 2019-05-08 NOTE — Plan of Care (Signed)
  Problem: Education: Goal: Knowledge of the prescribed therapeutic regimen will improve Outcome: Progressing   Problem: Activity: Goal: Interest or engagement in leisure activities will improve Outcome: Progressing  D: Pt alert and oriented on the unit. Pt denies SI/HI, A/VH. Pt was isolative in her room but did attend group. Pt is pleasant and cooperative. A: Education, support and encouragement provided, q15 minute safety checks remain in effect. Medications administered per MD orders. R: No reactions/side effects to medicine noted. Pt denies any concerns at this time, and verbally contracts for safety. Pt ambulating on the unit with no issues. Pt remains safe on and off the unit.

## 2019-05-09 LAB — GLUCOSE, CAPILLARY: Glucose-Capillary: 87 mg/dL (ref 70–99)

## 2019-05-09 MED ORDER — RISPERIDONE 3 MG PO TABS: 3.0000 mg | ORAL_TABLET | Freq: Two times a day (BID) | ORAL | 2 refills | Status: DC

## 2019-05-09 MED ORDER — CARVEDILOL 12.5 MG PO TABS: 12.5000 mg | ORAL_TABLET | Freq: Two times a day (BID) | ORAL | 2 refills | Status: DC

## 2019-05-09 MED ORDER — BENZTROPINE MESYLATE 0.5 MG PO TABS: 0.5000 mg | ORAL_TABLET | Freq: Two times a day (BID) | ORAL | 2 refills | Status: DC

## 2019-05-09 MED ORDER — TEMAZEPAM 30 MG PO CAPS: 30.0000 mg | ORAL_CAPSULE | Freq: Every day | ORAL | 0 refills | Status: DC

## 2019-05-09 NOTE — Discharge Summary (Signed)
Physician Discharge Summary Note  Patient:  Nancy Drake is an 58 y.o., female MRN:  [id number] DOB:  October 20, 1960 Patient phone:  [phone] (home)  Patient address:   [place] [id number],  Total Time spent with patient: 15 minutes  Date of Admission:  05/04/2019 Date of Discharge: 05/09/19  Reason for Admission:  mania  Principal Problem: <principal problem not specified> Discharge Diagnoses: Active Problems:   Bipolar I disorder, current or most recent episode manic, with psychotic features ([place])   MDD (major depressive disorder), recurrent, severe, with psychosis ([place])   Past Psychiatric History: History of bipolar disorder with multiple previous hospitalizations. Most recently admitted here in February 2020 after threatening staff at her outpatient office.  Past Medical History:  Past Medical History:  Diagnosis Date  . Anxiety    lost a child , sees psych  . Biventricular cardiac pacemaker -[employer]    DOI 2013  . Cardiomyopathy nonischemic    dx after a syncope 10-2010  . Complete heart block ([place]) 03-2012  . Mole of skin    bx 05-2013  . Thyroid disease    used to see Dr Debbora Presto    Past Surgical History:  Procedure Laterality Date  . BI-VENTRICULAR PACEMAKER INSERTION N/A 04/11/2012   Procedure: BI-VENTRICULAR PACEMAKER INSERTION (CRT-P);  Surgeon: Deboraha Sprang, MD;  Location: [facility] CATH LAB;  Service: Cardiovascular;  Laterality: N/A;  . CESAREAN SECTION    . COLONOSCOPY     2010  . LEAD REVISION N/A 04/12/2012   Procedure: LEAD REVISION;  Surgeon: Evans Lance, MD;  Location: [facility] CATH LAB;  Service: Cardiovascular;  Laterality: N/A;  . PACEMAKER INSERTION  03-2012  . TEMPORARY PACEMAKER INSERTION Right 04/08/2012   Procedure: TEMPORARY PACEMAKER INSERTION;  Surgeon: Burnell Blanks, MD;  Location: [facility] CATH LAB;  Service: Cardiovascular;  Laterality: Right;   Family History:  Family History  Problem Relation Age of Onset   . Other Other        uncle with pacemaker  . Heart disease Other        PACERMAKER  . Breast cancer Other        cousin  . Diabetes Mother   . Cancer Father        prostate cancer  . Colon cancer Neg Hx    Family Psychiatric  History: Denies Social History:  Social History   Substance and Sexual Activity  Alcohol Use Yes   Comment: socially      Social History   Substance and Sexual Activity  Drug Use No    Social History   Socioeconomic History  . Marital status: Divorced    Spouse name: Not on file  . Number of children: 2  . Years of education: Not on file  . Highest education level: Not on file  Occupational History  . Occupation: not working at present    Employer: [employer]  . Financial resource strain: Not on file  . Food insecurity    Worry: Not on file    Inability: Not on file  . Transportation needs    Medical: Not on file    Non-medical: Not on file  Tobacco Use  . Smoking status: Never Smoker  . Smokeless tobacco: Never Used  Substance and Sexual Activity  . Alcohol use: Yes    Comment: socially   . Drug use: No  . Sexual activity: Yes    Birth control/protection: Post-menopausal  Lifestyle  . Physical activity  Days per week: Not on file    Minutes per session: Not on file  . Stress: Not on file  Relationships  . Social Herbalist on phone: Not on file    Gets together: Not on file    Attends religious service: Not on file    Active member of club or organization: Not on file    Attends meetings of clubs or organizations: Not on file    Relationship status: Not on file  Other Topics Concern  . Not on file  Social History Narrative   Born in [place], lives w/ father    2 child, lost one     Hospital Course:  From admission H&P: This is a repeat admission for Nancy Drake, well-known to the service and our outpatient clinics, she is 40 she suffers from a bipolar type condition, once again has required petition  for involuntary commitment.  According to petition papers she was wearing only a bath towel in public she was outside yelling at her neighbors and was uncooperative with police when they came to investigate the situation and realize she needed treatment. The patient herself states she is not on any medication except for her blood pressure medications and she names them specifically but apparently is not taking psychotropic medications.  She has refused long-acting injectables in the past. Her last admission was in February she had threatened staff at her psychiatry office at this point she denies thoughts of harming self or others lacks insight into her condition, states that she did not need to be here did not need to be brought here that she was just "celebrating outside", describes that her father was cancer free and that she needed to "celebrate!" Reports compliance with her hypertension medications but nothing else.  Nancy Drake was admitted for manic behaviors under IVC. She was brought in by [facility] after neighbors called due to her yelling in the streets while wearing a bath towel. Per sister's report she had not been taking medications, and she believed mood instability was related to the anniversary of the patient's daughter's death. She was agitated, pressured, and tangential in the ED. She remained on the [facility] unit for five days. She was started on Risperdal, Cogentin, and Restoril. She participated in group therapy on the unit. She responded well to treatment with no adverse effects reported. She has shown calmer mood and behavior. On day of discharge, she is calm and cooperative. No delusional thought content expressed. No signs of responding to internal stimuli. She denies any SI/HI/AVH and contracts for safety. She is discharging on the medications listed below. She agrees to follow up at the [facility] and with Dr. Toy Care (see below). She is provided with prescriptions for medications upon  discharge. Her ex-husband is picking her up for discharge.  Physical Findings: AIMS: Facial and Oral Movements Muscles of Facial Expression: None, normal Lips and Perioral Area: None, normal Jaw: None, normal Tongue: None, normal,Extremity Movements Upper (arms, wrists, hands, fingers): None, normal Lower (legs, knees, ankles, toes): None, normal, Trunk Movements Neck, shoulders, hips: None, normal, Overall Severity Severity of abnormal movements (highest score from questions above): None, normal Incapacitation due to abnormal movements: None, normal Patient's awareness of abnormal movements (rate only patient's report): No Awareness, Dental Status Current problems with teeth and/or dentures?: No Does patient usually wear dentures?: No  CIWA:    COWS:     Musculoskeletal: Strength & Muscle Tone: within normal limits Gait & Station: normal  Patient leans: N/A  Psychiatric Specialty Exam: Physical Exam  Nursing note and vitals reviewed. Constitutional: She is oriented to person, place, and time. She appears well-developed and well-nourished.  Cardiovascular: Normal rate.  Respiratory: Effort normal.  Neurological: She is alert and oriented to person, place, and time.    Review of Systems  Constitutional: Negative.   Respiratory: Negative for cough and shortness of breath.   Cardiovascular: Negative for chest pain.  Psychiatric/Behavioral: Negative for depression, hallucinations and suicidal ideas. The patient is not nervous/anxious and does not have insomnia.     Blood pressure 122/84, pulse 61, temperature 98.6 F (37 C), temperature source Oral, resp. rate 18, height 5' 0.5" (1.537 m), weight 64.4 kg, SpO2 99 %.Body mass index is 27.28 kg/m.  See MD's discharge SRA      Has this patient used any form of tobacco in the last 30 days? (Cigarettes, Smokeless Tobacco, Cigars, and/or Pipes)  No  Blood Alcohol level:  Lab Results  Component Value Date   ETH <10 05/03/2019    ETH <10 [id number]    Metabolic Disorder Labs:  Lab Results  Component Value Date   HGBA1C 5.7 (H) 05/06/2019   MPG 116.89 05/06/2019   MPG 122.63 10/09/2018   No results found for: PROLACTIN Lab Results  Component Value Date   CHOL 199 10/09/2018   TRIG 121 10/09/2018   HDL 57 10/09/2018   CHOLHDL 3.5 10/09/2018   VLDL 24 10/09/2018   LDLCALC 118 (H) 10/09/2018   LDLCALC 123 (H) 06/29/2013    See Psychiatric Specialty Exam and Suicide Risk Assessment completed by Attending Physician prior to discharge.  Discharge destination:  Home  Is patient on multiple antipsychotic therapies at discharge:  No   Has Patient had three or more failed trials of antipsychotic monotherapy by history:  No  Recommended Plan for Multiple Antipsychotic Therapies: NA   Allergies as of 05/09/2019      Reactions   Ambien [zolpidem Tartrate] Other (See Comments)   Pt cannot take any sleeping pills.    Desloratadine    Insomnia      Medication List    STOP taking these medications   ALPRAZolam 0.5 MG tablet Commonly known as: XANAX     TAKE these medications     Indication  benztropine 0.5 MG tablet Commonly known as: COGENTIN Take 1 tablet (0.5 mg total) by mouth 2 (two) times daily. What changed: how much to take  Indication: Extrapyramidal Reaction caused by Medications   carvedilol 12.5 MG tablet Commonly known as: COREG Take 1 tablet (12.5 mg total) by mouth 2 (two) times daily with a meal.  Indication: High Blood Pressure Disorder   lisinopril-hydrochlorothiazide 20-12.5 MG tablet Commonly known as: ZESTORETIC Take 2 tablets by mouth daily.  Indication: High Blood Pressure Disorder   risperiDONE 3 MG tablet Commonly known as: RISPERDAL Take 1 tablet (3 mg total) by mouth 2 (two) times daily. What changed:   medication strength  how much to take  how to take this  when to take this  additional instructions  Indication: Manic Phase of Manic-Depression    temazepam 30 MG capsule Commonly known as: RESTORIL Take 1 capsule (30 mg total) by mouth at bedtime.  Indication: Trouble Sleeping      Follow-up Information    Chucky May, MD Follow up.   Specialty: Psychiatry Why: If you want to see Dr. Toy Care, please call at the number provided. They will need a deposit to hold a spot to  see Dr. Toy Care and will work with you to get an appointment.  Contact information: [address] [address] [place] [address] [phone]        [facility] Follow up on 05/15/2019.   Why: Your therapy appt with Marya Amsler is on 9/14 @ 8am via telemed. Your med mgmt appt with Sharyn Lull is on 9/15 @ 11am. Go to [email or web address], click "New pt", click "step 2" and fill that out. Send insurance card to [email or web address] Contact information: [place] [address] [phone]           Follow-up recommendations: Activity as tolerated. Diet as recommended by primary care physician. Keep all scheduled follow-up appointments as recommended.   Comments:   Patient is instructed to take all prescribed medications as recommended. Report any side effects or adverse reactions to your outpatient psychiatrist. Patient is instructed to abstain from alcohol and illegal drugs while on prescription medications. In the event of worsening symptoms, patient is instructed to call the crisis hotline, 911, or go to the nearest emergency department for evaluation and treatment.  Signed: Connye Burkitt, NP 05/09/2019, 10:44 AM

## 2019-05-09 NOTE — Progress Notes (Signed)
Pt discharged to lobby. Pt was stable and appreciative at that time. All papers and prescriptions were given and valuables returned. Verbal understanding expressed. Denies SI/HI and A/VH. Pt given opportunity to express concerns and ask questions.  

## 2019-05-09 NOTE — BHH Suicide Risk Assessment (Signed)
[facility] Discharge Suicide Risk Assessment   Principal Problem: Exacerbation of bipolar/manic condition involving some disturbance in public Discharge Diagnoses: Active Problems:   Bipolar I disorder, current or most recent episode manic, with psychotic features ([place])   MDD (major depressive disorder), recurrent, severe, with psychosis ([place])   Total Time spent with patient: 45 minutes  Musculoskeletal: Strength & Muscle Tone: within normal limits Gait & Station: normal Patient leans: N/A  Psychiatric Specialty Exam: ROS  Blood pressure 122/84, pulse 61, temperature 98.6 F (37 C), temperature source Oral, resp. rate 18, height 5' 0.5" (1.537 m), weight 64.4 kg, SpO2 99 %.Body mass index is 27.28 kg/m.  General Appearance: Casual  Eye Contact::  Good  Speech:  Clear and Coherent409  Volume:  Normal  Mood:  Euthymic  Affect:  Full Range  Thought Process:  Coherent, Goal Directed and Descriptions of Associations: Circumstantial  Orientation:  Full (Time, Place, and Person)  Thought Content:  Logical and Rumination  Suicidal Thoughts:  No  Homicidal Thoughts:  No  Memory:  Immediate;   Fair Recent;   Good  Judgement:  Intact  Insight:  Shallow  Psychomotor Activity:  Normal  Concentration:  Good  Recall:  Good  Fund of Knowledge:Good  Language: Good  Akathisia:  Negative  Handed:  Right  AIMS (if indicated):     Assets:  Desire for Improvement Financial Resources/Insurance Housing Intimacy Leisure Time Physical Health Resilience Social Support Transportation  Sleep:  Number of Hours: 7.5  Cognition: WNL  ADL's:  Intact   Mental Status Per Nursing Assessment::   On Admission:     Demographic Factors:  NA  Loss Factors: NA  Historical Factors: NA  Risk Reduction Factors:   Sense of responsibility to family and Religious beliefs about death  Continued Clinical Symptoms:  Previous Psychiatric Diagnoses and Treatments  Cognitive Features That Contribute To  Risk:  None    Suicide Risk:  Minimal: No identifiable suicidal ideation.  Patients presenting with no risk factors but with morbid ruminations; may be classified as minimal risk based on the severity of the depressive symptoms  Follow-up Information    Chucky May, MD Follow up.   Specialty: Psychiatry Why: she wants referral for med mgmt to dr. Toy Care.  Contact information: [address] [address] [place] [address] [phone]        [facility] Follow up.   Why: if Toy Care is booked out too far, pt is open to referral for med mgmt at this location (near her home).  Contact information: [address] [place] [address] [phone]           Plan Of Care/Follow-up recommendations:  Activity:  full  Adyan Palau, MD 05/09/2019, 8:28 AM

## 2019-05-09 NOTE — Progress Notes (Signed)
  [facility] Adult Case Management Discharge Plan :  Will you be returning to the same living situation after discharge:  Yes,  home At discharge, do you have transportation home?: Yes,  pt's ex-husband Do you have the ability to pay for your medications: Yes,  private insurance  Release of information consent forms completed and in the chart;  Patient's signature needed at discharge.  Patient to Follow up at: Follow-up Information    Chucky May, MD Follow up.   Specialty: Psychiatry Why: If you want to see Dr. Toy Care, please call at the number provided. They will need a deposit to hold a spot to see Dr. Toy Care and will work with you to get an appointment.  Contact information: [address] [address] [place] [address] [phone]        [facility] Follow up on 05/15/2019.   Why: Your therapy appt with Marya Amsler is on 9/14 @ 8am via telemed. Your med mgmt appt with Sharyn Lull is on 9/15 @ 11am. Go to [email or web address], click "New pt", click "step 2" and fill that out. Send insurance card to [email or web address] Contact information: [place] [address] [phone]           Next level of care provider has access to [place] and Suicide Prevention discussed: Yes,  pt's sister     Has patient been referred to the Quitline?: N/A patient is not a smoker  Patient has been referred for addiction treatment: Yes  Trecia Rogers, LCSW 05/09/2019, 9:35 AM

## 2019-05-09 NOTE — BHH Suicide Risk Assessment (Signed)
[place] INPATIENT:  Family/Significant Other Suicide Prevention Education  Suicide Prevention Education:  Education Completed; Pt's sister, Nancy Drake, has been identified by the patient as the family member/significant other with whom the patient will be residing, and identified as the person(s) who will aid the patient in the event of a mental health crisis (suicidal ideations/suicide attempt).  With written consent from the patient, the family member/significant other has been provided the following suicide prevention education, prior to the and/or following the discharge of the patient.  The suicide prevention education provided includes the following:  Suicide risk factors  Suicide prevention and interventions  National Suicide Hotline telephone number  [facility] assessment telephone number  [facility] Emergency Assistance [place] and/or Residential Mobile Crisis Unit telephone number  Request made of family/significant other to:  Remove weapons (e.g., guns, rifles, knives), all items previously/currently identified as safety concern.    Remove drugs/medications (over-the-counter, prescriptions, illicit drugs), all items previously/currently identified as a safety concern.  The family member/significant other verbalizes understanding of the suicide prevention education information provided.  The family member/significant other agrees to remove the items of safety concern listed above.  CSW contacted pt's sister, Nancy Drake. Pt's sister stated that she does not have any questions or concerns but she will be picking the patient up around 1:30pm.   Trecia Rogers 05/09/2019, 9:03 AM

## 2019-05-09 NOTE — Progress Notes (Signed)
Adult Psychoeducational Group Note  Date:  05/09/2019 Time:  4:59 AM  Group Topic/Focus:  Wrap-Up Group:   The focus of this group is to help patients review their daily goal of treatment and discuss progress on daily workbooks.  Participation Level:  Did Not Attend  Participation Quality:  Did not attend  Affect:  Did not attend  Cognitive:  Did not attend  Insight: None  Engagement in Group:  Did not attend  Modes of Intervention:  Did not attend  Additional Comments: Pt did not attend evening wrap up group tonight.  Candy Sledge 05/09/2019, 4:59 AM

## 2020-06-12 IMAGING — CR DG WRIST COMPLETE 3+V*R*
4 series · 4 of 4 positions shown · non-contrast
Comparison: None.

CLINICAL DATA: Acute psychiatric disorder. Had to be restrained.
Wrist pain.

EXAM:
RIGHT WRIST - COMPLETE 3+ VIEW

[x wrist pa right]
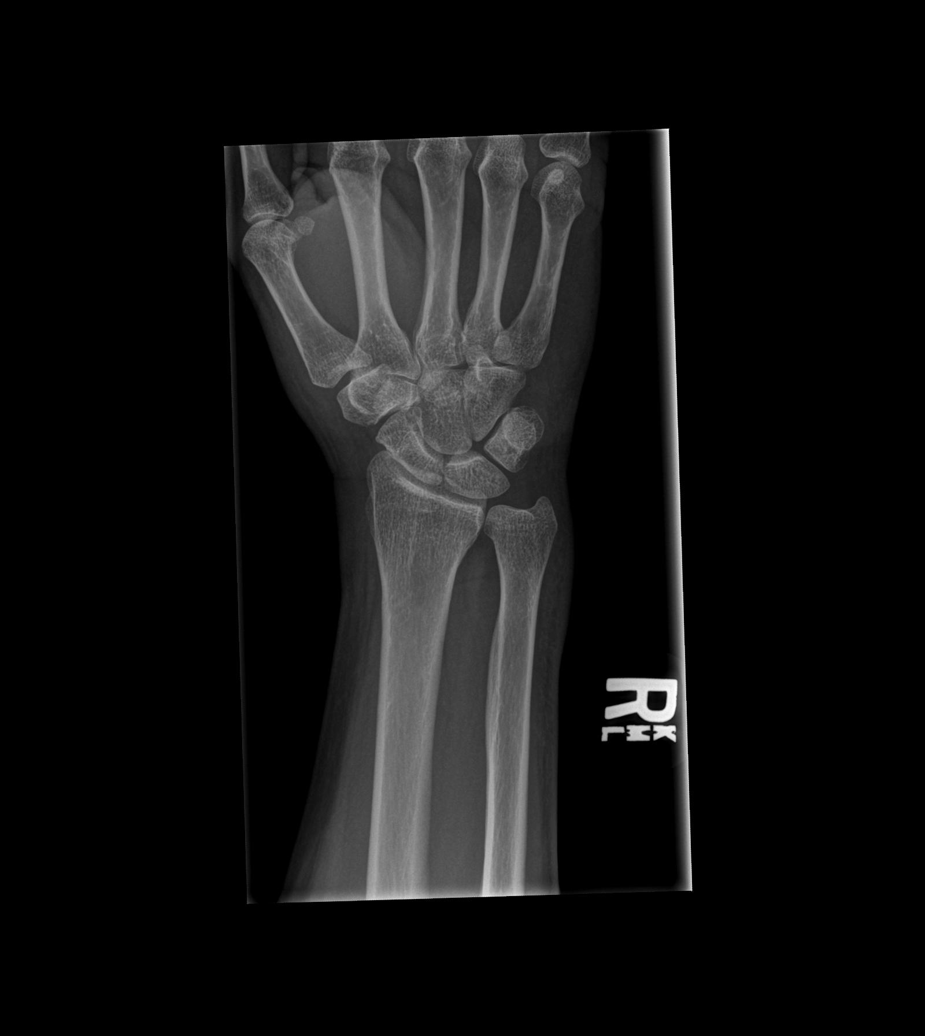

[x wrist obl right]
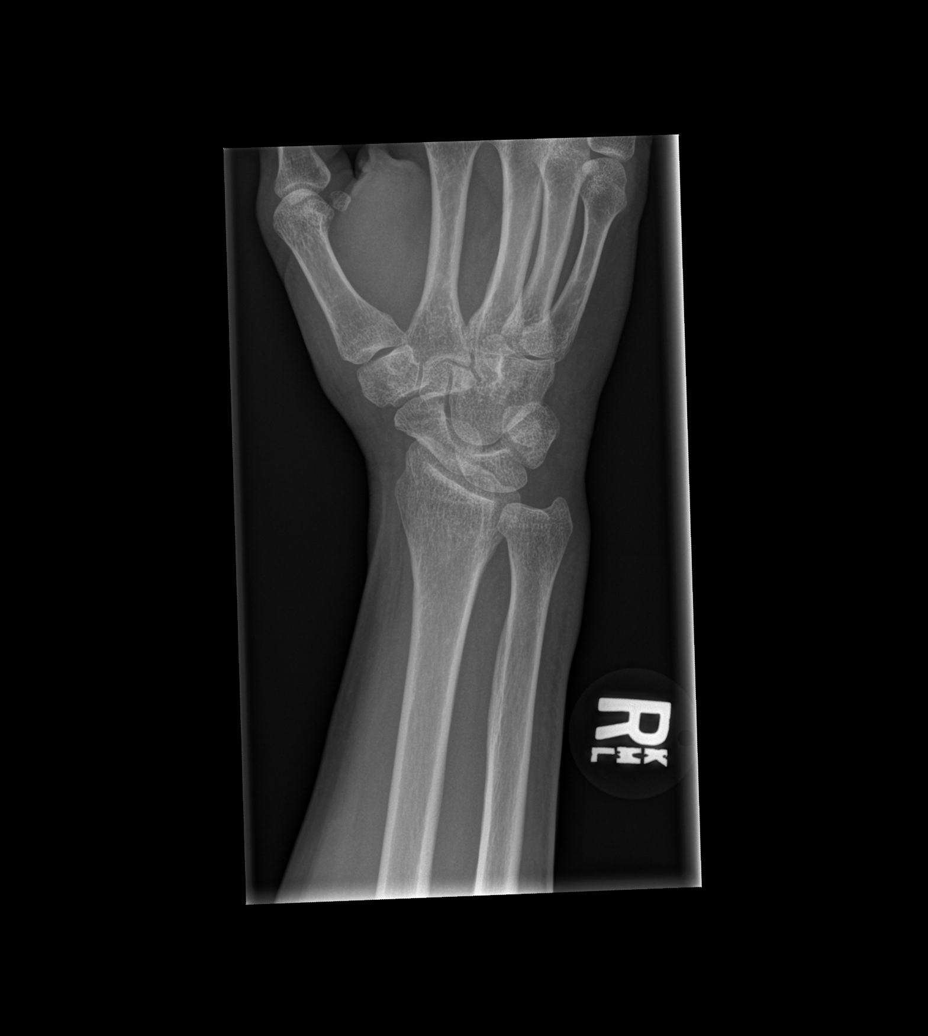

[x wrist lat right]
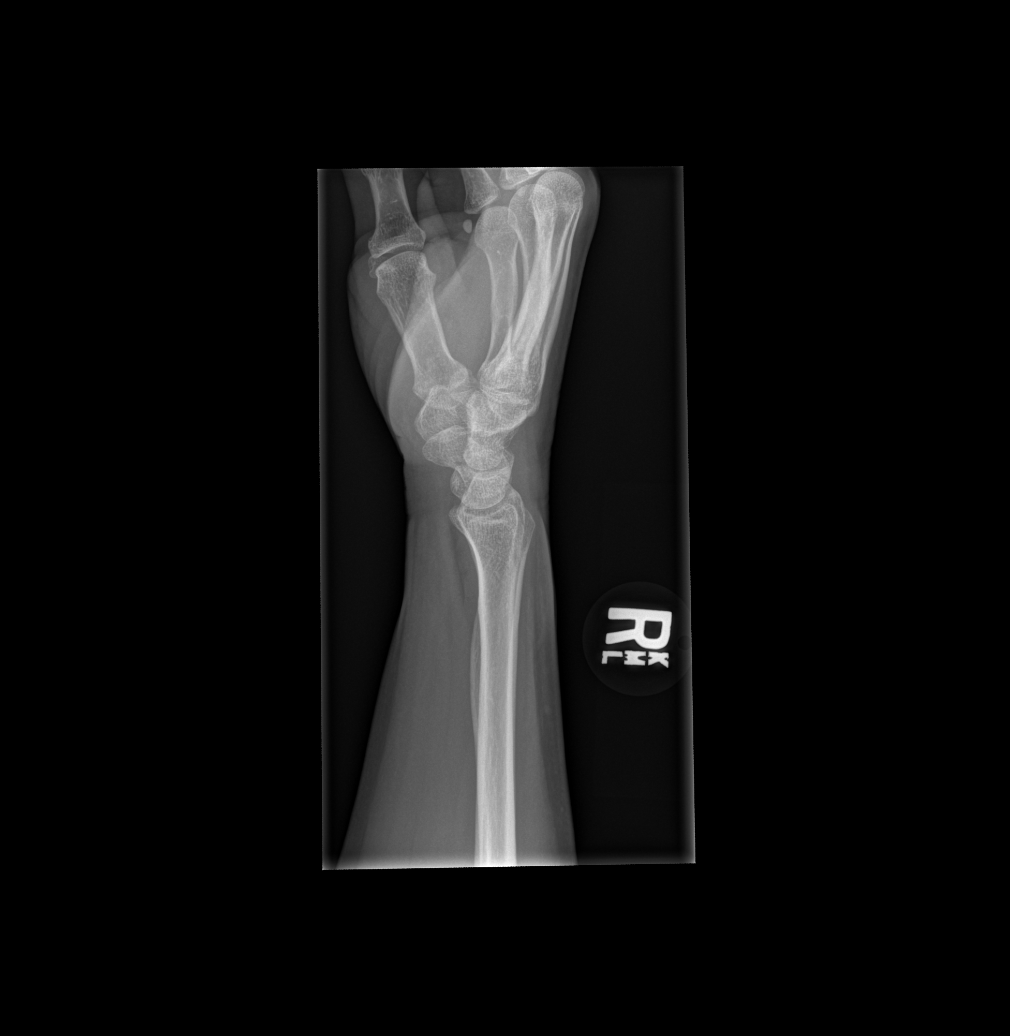

[x wrist navicular view right]
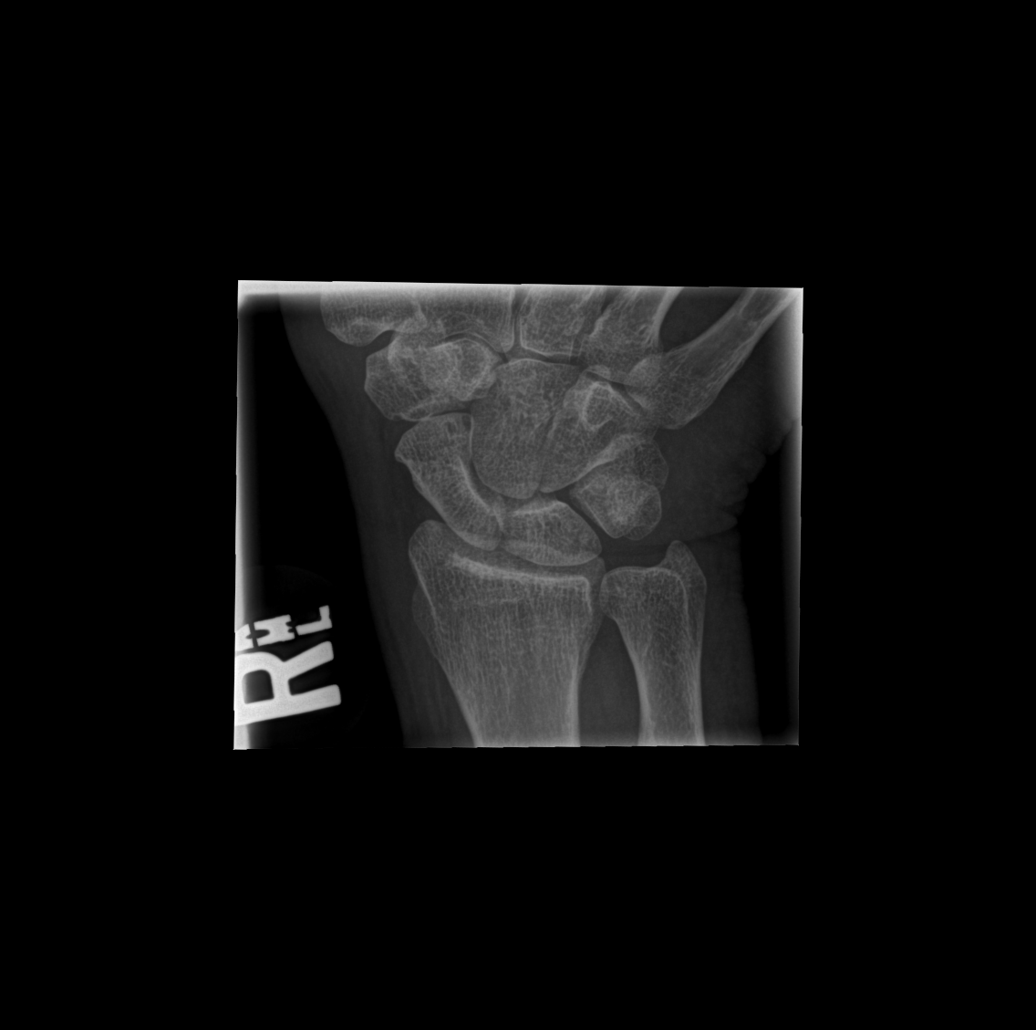

[4 of 4 positions shown; findings below may reference images not displayed]

FINDINGS: There is no evidence of fracture or dislocation. There is no
evidence of arthropathy or other focal bone abnormality. Soft
tissues are unremarkable.
IMPRESSION: Negative.

## 2020-08-05 ENCOUNTER — Encounter (HOSPITAL_COMMUNITY): Payer: Self-pay | Admitting: Emergency Medicine

## 2020-08-05 ENCOUNTER — Emergency Department (HOSPITAL_COMMUNITY)
Admission: EM | Admit: 2020-08-05 | Discharge: 2020-08-06 | Disposition: A | Discharge status: Home / Self Care | Payer: BLUE CROSS/BLUE SHIELD | Source: Home / Self Care | Attending: Emergency Medicine | Admitting: Emergency Medicine

## 2020-08-05 ENCOUNTER — Other Ambulatory Visit: Payer: Self-pay

## 2020-08-05 DIAGNOSIS — Z20822 Contact with and (suspected) exposure to covid-19: Secondary | ICD-10-CM | POA: Insufficient documentation

## 2020-08-05 DIAGNOSIS — Z950 Presence of cardiac pacemaker: Secondary | ICD-10-CM | POA: Insufficient documentation

## 2020-08-05 DIAGNOSIS — Z79899 Other long term (current) drug therapy: Secondary | ICD-10-CM | POA: Insufficient documentation

## 2020-08-05 DIAGNOSIS — F419 Anxiety disorder, unspecified: Secondary | ICD-10-CM | POA: Insufficient documentation

## 2020-08-05 DIAGNOSIS — F29 Unspecified psychosis not due to a substance or known physiological condition: Secondary | ICD-10-CM

## 2020-08-05 DIAGNOSIS — I10 Essential (primary) hypertension: Secondary | ICD-10-CM | POA: Insufficient documentation

## 2020-08-05 DIAGNOSIS — R456 Violent behavior: Secondary | ICD-10-CM | POA: Insufficient documentation

## 2020-08-05 DIAGNOSIS — F312 Bipolar disorder, current episode manic severe with psychotic features: Secondary | ICD-10-CM | POA: Insufficient documentation

## 2020-08-05 DIAGNOSIS — R451 Restlessness and agitation: Secondary | ICD-10-CM | POA: Insufficient documentation

## 2020-08-05 DIAGNOSIS — Z046 Encounter for general psychiatric examination, requested by authority: Secondary | ICD-10-CM | POA: Insufficient documentation

## 2020-08-05 HISTORY — DX: Generalized anxiety disorder: F41.1

## 2020-08-05 HISTORY — DX: Bipolar disorder, current episode manic severe with psychotic features: F31.2

## 2020-08-05 HISTORY — DX: Schizophrenia, unspecified: F20.9

## 2020-08-05 LAB — CBC
HCT: 32.2 % — ABNORMAL LOW (ref 36.0–46.0)
Hemoglobin: 10.3 g/dL — ABNORMAL LOW (ref 12.0–15.0)
MCH: 27.7 pg (ref 26.0–34.0)
MCHC: 32 g/dL (ref 30.0–36.0)
MCV: 86.6 fL (ref 80.0–100.0)
Platelets: 372 10*3/uL (ref 150–400)
RBC: 3.72 MIL/uL — ABNORMAL LOW (ref 3.87–5.11)
RDW: 16.9 % — ABNORMAL HIGH (ref 11.5–15.5)
WBC: 11.8 10*3/uL — ABNORMAL HIGH (ref 4.0–10.5)
nRBC: 0 % (ref 0.0–0.2)

## 2020-08-05 LAB — ETHANOL: Alcohol, Ethyl (B): <10 mg/dL (ref <10)

## 2020-08-05 LAB — ACETAMINOPHEN LEVEL: Acetaminophen (Tylenol), Serum: <10 ug/mL — ABNORMAL LOW (ref 10–30)

## 2020-08-05 LAB — COMPREHENSIVE METABOLIC PANEL
ALT: 29 U/L (ref 0–44)
AST: 29 U/L (ref 15–41)
Albumin: 4.5 g/dL (ref 3.5–5.0)
Alkaline Phosphatase: 93 U/L (ref 38–126)
Anion gap: 11 (ref 5–15)
BUN: 19 mg/dL (ref 6–20)
CO2: 21 mmol/L — ABNORMAL LOW (ref 22–32)
Calcium: 9.2 mg/dL (ref 8.9–10.3)
Chloride: 108 mmol/L (ref 98–111)
Creatinine, Ser: 1.03 mg/dL — ABNORMAL HIGH (ref 0.44–1.00)
GFR, Estimated: >60 mL/min (ref >60)
Glucose, Bld: 122 mg/dL — ABNORMAL HIGH (ref 70–99)
Potassium: 3.5 mmol/L (ref 3.5–5.1)
Sodium: 140 mmol/L (ref 135–145)
Total Bilirubin: 0.3 mg/dL (ref 0.3–1.2)
Total Protein: 7.9 g/dL (ref 6.5–8.1)

## 2020-08-05 LAB — I-STAT BETA HCG BLOOD, ED (MC, WL, AP ONLY): I-stat hCG, quantitative: 5.3 m[IU]/mL — ABNORMAL HIGH (ref <5)

## 2020-08-05 LAB — SALICYLATE LEVEL: Salicylate Lvl: <7 mg/dL — ABNORMAL LOW (ref 7.0–30.0)

## 2020-08-05 LAB — RESP PANEL BY RT-PCR (FLU A&B, COVID) ARPGX2
Influenza A by PCR: NEGATIVE
Influenza B by PCR: NEGATIVE
SARS Coronavirus 2 by RT PCR: NEGATIVE

## 2020-08-05 MED ORDER — ONDANSETRON HCL 4 MG PO TABS: 4.0000 mg | ORAL_TABLET | Freq: Three times a day (TID) | ORAL | Status: DC | PRN

## 2020-08-05 MED ORDER — ZIPRASIDONE MESYLATE 20 MG IM SOLR: 20.0000 mg | INTRAMUSCULAR | Status: DC | PRN

## 2020-08-05 MED ORDER — LORAZEPAM 1 MG PO TABS
1.0000 mg | ORAL_TABLET | ORAL | Status: AC | PRN
  Administered 2020-08-06: 1 mg via ORAL
  Filled 2020-08-05: qty 1

## 2020-08-05 MED ORDER — OLANZAPINE 10 MG PO TBDP
10.0000 mg | ORAL_TABLET | Freq: Every day | ORAL | Status: DC
  Administered 2020-08-05 – 2020-08-06 (×2): 10 mg via ORAL
  Filled 2020-08-05 (×2): qty 1

## 2020-08-05 MED ORDER — RISPERIDONE 1 MG PO TBDP
2.0000 mg | ORAL_TABLET | Freq: Three times a day (TID) | ORAL | Status: DC | PRN
  Administered 2020-08-06: 2 mg via ORAL
  Filled 2020-08-05: qty 2

## 2020-08-05 MED ORDER — ALUM & MAG HYDROXIDE-SIMETH 200-200-20 MG/5ML PO SUSP: 30.0000 mL | Freq: Four times a day (QID) | ORAL | Status: DC | PRN

## 2020-08-05 MED ORDER — LORAZEPAM 2 MG/ML IJ SOLN
2.0000 mg | Freq: Once | INTRAMUSCULAR | Status: AC
  Administered 2020-08-05: 2 mg via INTRAMUSCULAR
  Filled 2020-08-05: qty 1
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
## 2020-08-05 NOTE — ED Notes (Signed)
Patient noted to be delusional, perseverated on celebrities that she believes hair she has cut, and who have died. Patient noted to be redirectable, but very disorganized and difficulty following commands due to this. Patient is tangential in thought, with idea of reference and delusions of grandeur. Patient is cooperative at this time. Patient given oral antipsychotic for symptoms, which she took willingly.
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
## 2020-08-05 NOTE — ED Notes (Signed)
Patient redirected back to room. Patient is hyper-verbal. Patient also noted to be responding to internal stimuli, speaking with someone that is not present in the room.

## 2020-08-06 ENCOUNTER — Inpatient Hospital Stay (HOSPITAL_COMMUNITY)
Admission: RE | Admit: 2020-08-06 | Discharge: 2020-08-11 | DRG: 885 | Disposition: A | Discharge status: Home / Self Care | Payer: BLUE CROSS/BLUE SHIELD | Source: Intra-hospital | Attending: Psychiatry | Admitting: Psychiatry

## 2020-08-06 DIAGNOSIS — I5022 Chronic systolic (congestive) heart failure: Secondary | ICD-10-CM | POA: Diagnosis present

## 2020-08-06 DIAGNOSIS — Z888 Allergy status to other drugs, medicaments and biological substances status: Secondary | ICD-10-CM | POA: Diagnosis not present

## 2020-08-06 DIAGNOSIS — I110 Hypertensive heart disease with heart failure: Secondary | ICD-10-CM | POA: Diagnosis present

## 2020-08-06 DIAGNOSIS — Z833 Family history of diabetes mellitus: Secondary | ICD-10-CM

## 2020-08-06 DIAGNOSIS — I428 Other cardiomyopathies: Secondary | ICD-10-CM | POA: Diagnosis not present

## 2020-08-06 DIAGNOSIS — F32A Depression, unspecified: Secondary | ICD-10-CM | POA: Diagnosis present

## 2020-08-06 DIAGNOSIS — D649 Anemia, unspecified: Secondary | ICD-10-CM | POA: Diagnosis present

## 2020-08-06 DIAGNOSIS — Z950 Presence of cardiac pacemaker: Secondary | ICD-10-CM | POA: Diagnosis not present

## 2020-08-06 DIAGNOSIS — F312 Bipolar disorder, current episode manic severe with psychotic features: Secondary | ICD-10-CM | POA: Diagnosis present

## 2020-08-06 DIAGNOSIS — Z9114 Patient's other noncompliance with medication regimen: Secondary | ICD-10-CM

## 2020-08-06 DIAGNOSIS — F23 Brief psychotic disorder: Secondary | ICD-10-CM | POA: Diagnosis not present

## 2020-08-06 DIAGNOSIS — I10 Essential (primary) hypertension: Secondary | ICD-10-CM | POA: Diagnosis present

## 2020-08-06 DIAGNOSIS — F319 Bipolar disorder, unspecified: Secondary | ICD-10-CM | POA: Diagnosis not present

## 2020-08-06 DIAGNOSIS — Z20822 Contact with and (suspected) exposure to covid-19: Secondary | ICD-10-CM | POA: Diagnosis present

## 2020-08-06 DIAGNOSIS — F419 Anxiety disorder, unspecified: Secondary | ICD-10-CM | POA: Diagnosis present

## 2020-08-06 DIAGNOSIS — I442 Atrioventricular block, complete: Secondary | ICD-10-CM | POA: Diagnosis present

## 2020-08-06 NOTE — ED Notes (Signed)
Pt is very anxious and tearful about being here.  States, "Nobody cares".  Refused Risperidone earlier, but later agreed to take it. She preferred Xanax.
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
## 2020-08-06 NOTE — Progress Notes (Signed)
Pt accepted to  [facility], [address] [facility], Room 503-1   Harriett Sine, NP is the accepting provider.    Dr. Mallie Darting is the attending provider.    Call report to [phone], if no answer [phone]    [facility] @ [facility] ED notified.     Pt is IVC.    Pt may be transported by  Law Enforcement   Pt scheduled  to arrive at [facility] tonight(12/7) after 11pm.   [place] Disposition [phone] (cell)

## 2020-08-06 NOTE — ED Notes (Signed)
[employer] officers at bedside pt transported to [facility] room 503-1

## 2020-08-06 NOTE — ED Notes (Signed)
TTS commendation is inpatient care.

## 2020-08-06 NOTE — ED Notes (Signed)
[employer] called for transport of IVC pt

## 2020-08-07 ENCOUNTER — Encounter (HOSPITAL_COMMUNITY): Payer: Self-pay | Admitting: Psychiatry

## 2020-08-07 ENCOUNTER — Other Ambulatory Visit: Payer: Self-pay

## 2020-08-07 DIAGNOSIS — F319 Bipolar disorder, unspecified: Secondary | ICD-10-CM | POA: Diagnosis present

## 2020-08-07 LAB — HEMOGLOBIN A1C
Hgb A1c MFr Bld: 5.6 % (ref 4.8–5.6)
Mean Plasma Glucose: 114.02 mg/dL

## 2020-08-07 LAB — TSH: TSH: 4.813 u[IU]/mL — ABNORMAL HIGH (ref 0.350–4.500)

## 2020-08-07 LAB — RAPID URINE DRUG SCREEN, HOSP PERFORMED
Amphetamines: NOT DETECTED
Barbiturates: NOT DETECTED
Benzodiazepines: NOT DETECTED
Cocaine: NOT DETECTED
Opiates: NOT DETECTED
Tetrahydrocannabinol: NOT DETECTED

## 2020-08-07 LAB — T4, FREE: Free T4: 0.72 ng/dL (ref 0.61–1.12)

## 2020-08-07 LAB — LIPID PANEL
Cholesterol: 217 mg/dL — ABNORMAL HIGH (ref 0–200)
HDL: 66 mg/dL (ref >40)
LDL Cholesterol: 141 mg/dL — ABNORMAL HIGH (ref 0–99)
Total CHOL/HDL Ratio: 3.3 RATIO
Triglycerides: 51 mg/dL (ref <150)
VLDL: 10 mg/dL (ref 0–40)

## 2020-08-07 MED ORDER — FERROUS SULFATE 325 (65 FE) MG PO TABS
325.0000 mg | ORAL_TABLET | Freq: Every day | ORAL | Status: DC
  Administered 2020-08-08 – 2020-08-11 (×4): 325 mg via ORAL
  Filled 2020-08-07 (×6): qty 1

## 2020-08-07 MED ORDER — ACETAMINOPHEN 325 MG PO TABS
650.0000 mg | ORAL_TABLET | Freq: Four times a day (QID) | ORAL | Status: DC | PRN
  Administered 2020-08-10 – 2020-08-11 (×2): 650 mg via ORAL
  Filled 2020-08-07 (×2): qty 2

## 2020-08-07 MED ORDER — TRAZODONE HCL 50 MG PO TABS: 50.0000 mg | ORAL_TABLET | Freq: Every evening | ORAL | Status: DC | PRN

## 2020-08-07 MED ORDER — ALUM & MAG HYDROXIDE-SIMETH 200-200-20 MG/5ML PO SUSP: 30.0000 mL | ORAL | Status: DC | PRN

## 2020-08-07 MED ORDER — LORAZEPAM 1 MG PO TABS: 1.0000 mg | ORAL_TABLET | ORAL | Status: DC | PRN

## 2020-08-07 MED ORDER — OLANZAPINE 10 MG PO TBDP
10.0000 mg | ORAL_TABLET | Freq: Three times a day (TID) | ORAL | Status: DC | PRN
  Filled 2020-08-07: qty 1

## 2020-08-07 MED ORDER — CARVEDILOL 25 MG PO TABS
25.0000 mg | ORAL_TABLET | Freq: Two times a day (BID) | ORAL | Status: DC
  Administered 2020-08-07 – 2020-08-11 (×7): 25 mg via ORAL
  Filled 2020-08-07 (×12): qty 1

## 2020-08-07 MED ORDER — RISPERIDONE 2 MG PO TBDP
2.0000 mg | ORAL_TABLET | Freq: Two times a day (BID) | ORAL | Status: DC
  Administered 2020-08-07 – 2020-08-08 (×2): 2 mg via ORAL
  Filled 2020-08-07 (×5): qty 1

## 2020-08-07 MED ORDER — ZIPRASIDONE MESYLATE 20 MG IM SOLR: 20.0000 mg | INTRAMUSCULAR | Status: DC | PRN

## 2020-08-07 MED ORDER — HYDROXYZINE HCL 25 MG PO TABS
25.0000 mg | ORAL_TABLET | Freq: Three times a day (TID) | ORAL | Status: DC | PRN
  Administered 2020-08-09: 25 mg via ORAL
  Filled 2020-08-07: qty 1

## 2020-08-07 MED ORDER — MAGNESIUM HYDROXIDE 400 MG/5ML PO SUSP: 30.0000 mL | Freq: Every day | ORAL | Status: DC | PRN

## 2020-08-07 NOTE — Progress Notes (Signed)
Pt denied SI/HI/AVH.  Pt was pleasant this shift and took medications without incident.  RN collected urine sample and female RN performed pt's EKG.  Pt has a pacemaker. Pt remains safe with q 15 min checks in place.  RN will continue to assess and intervene as indicated.

## 2020-08-07 NOTE — Progress Notes (Signed)
   08/07/20 0000  Psych Admission Type (Psych Patients Only)  Admission Status Involuntary  Psychosocial Assessment  Patient Complaints Other (Comment) (tired)  Eye Contact Brief  Facial Expression Sad  Affect Sad  Speech Slow  Interaction Assertive  Motor Activity Slow  Appearance/Hygiene In scrubs  Behavior Characteristics Cooperative  Mood Sad  Thought Process  Coherency WDL  Content Blaming others  Delusions Persecutory  Perception WDL  Hallucination None reported or observed  Judgment Poor  Confusion None  Danger to Self  Current suicidal ideation? Denies  Danger to Others  Danger to Others None reported or observed

## 2020-08-07 NOTE — Tx Team (Signed)
Interdisciplinary Treatment and Diagnostic Plan Update  08/07/2020 Time of Session: 9:05am HERAN CAMPAU MRN: [id number]  Principal Diagnosis: <principal problem not specified>  Secondary Diagnoses: Active Problems:   Bipolar 1 disorder (HCC)   Current Medications:  Current Facility-Administered Medications  Medication Dose Route Frequency Provider Last Rate Last Admin  . acetaminophen (TYLENOL) tablet 650 mg  650 mg Oral Q6H PRN Rozetta Nunnery, NP      . alum & mag hydroxide-simeth (MAALOX/MYLANTA) 200-200-20 MG/5ML suspension 30 mL  30 mL Oral Q4H PRN Lindon Romp A, NP      . hydrOXYzine (ATARAX/VISTARIL) tablet 25 mg  25 mg Oral TID PRN Lindon Romp A, NP      . magnesium hydroxide (MILK OF MAGNESIA) suspension 30 mL  30 mL Oral Daily PRN Lindon Romp A, NP      . traZODone (DESYREL) tablet 50 mg  50 mg Oral QHS PRN Rozetta Nunnery, NP       PTA Medications: Medications Prior to Admission  Medication Sig Dispense Refill Last Dose  . benztropine (COGENTIN) 0.5 MG tablet Take 1 tablet (0.5 mg total) by mouth 2 (two) times daily. (Patient not taking: Reported on 08/06/2020) 60 tablet 2 Not Taking at Unknown time  . carvedilol (COREG) 25 MG tablet Take 25 mg by mouth daily.      . ferrous sulfate 325 (65 FE) MG tablet Take 325 mg by mouth daily with breakfast.     . lisinopril-hydrochlorothiazide (PRINZIDE,ZESTORETIC) 20-12.5 MG tablet Take 2 tablets by mouth daily. (Patient not taking: Reported on 08/06/2020)     . risperiDONE (RISPERDAL) 3 MG tablet Take 1 tablet (3 mg total) by mouth 2 (two) times daily. (Patient not taking: Reported on 08/06/2020) 60 tablet 2 Not Taking at Unknown time  . temazepam (RESTORIL) 30 MG capsule Take 1 capsule (30 mg total) by mouth at bedtime. (Patient not taking: Reported on 08/06/2020) 30 capsule 0 Not Taking at Unknown time    Patient Stressors: Other: issues with her neighbors  Patient Strengths: Average or above average intelligence Capable of  independent living Communication skills Supportive family/friends  Treatment Modalities: Medication Management, Group therapy, Case management,  1 to 1 session with clinician, Psychoeducation, Recreational therapy.   Physician Treatment Plan for Primary Diagnosis: <principal problem not specified> Long Term Goal(s):     Short Term Goals:    Medication Management: Evaluate patient's response, side effects, and tolerance of medication regimen.  Therapeutic Interventions: 1 to 1 sessions, Unit Group sessions and Medication administration.  Evaluation of Outcomes: Not Met  Physician Treatment Plan for Secondary Diagnosis: Active Problems:   Bipolar 1 disorder ([place])  Long Term Goal(s):     Short Term Goals:       Medication Management: Evaluate patient's response, side effects, and tolerance of medication regimen.  Therapeutic Interventions: 1 to 1 sessions, Unit Group sessions and Medication administration.  Evaluation of Outcomes: Not Met   RN Treatment Plan for Primary Diagnosis: <principal problem not specified> Long Term Goal(s): Knowledge of disease and therapeutic regimen to maintain health will improve  Short Term Goals: Ability to remain free from injury will improve, Ability to verbalize frustration and anger appropriately will improve, Ability to identify and develop effective coping behaviors will improve and Compliance with prescribed medications will improve  Medication Management: RN will administer medications as ordered by provider, will assess and evaluate patient's response and provide education to patient for prescribed medication. RN will report any adverse and/or side effects to  prescribing provider.  Therapeutic Interventions: 1 on 1 counseling sessions, Psychoeducation, Medication administration, Evaluate responses to treatment, Monitor vital signs and CBGs as ordered, Perform/monitor CIWA, COWS, AIMS and Fall Risk screenings as ordered, Perform wound care  treatments as ordered.  Evaluation of Outcomes: Not Met   LCSW Treatment Plan for Primary Diagnosis: <principal problem not specified> Long Term Goal(s): Safe transition to appropriate next level of care at discharge, Engage patient in therapeutic group addressing interpersonal concerns.  Short Term Goals: Engage patient in aftercare planning with referrals and resources, Increase social support, Increase emotional regulation, Identify triggers associated with mental health/substance abuse issues and Increase skills for wellness and recovery  Therapeutic Interventions: Assess for all discharge needs, 1 to 1 time with Social worker, Explore available resources and support systems, Assess for adequacy in community support network, Educate family and significant other(s) on suicide prevention, Complete Psychosocial Assessment, Interpersonal group therapy.  Evaluation of Outcomes: Not Met   Progress in Treatment: Attending groups: No. Participating in groups: No. Taking medication as prescribed: No. and As evidenced by:  No medications have been ordered for this patient. Toleration medication: No. and As evidenced by:  has not taken medications yet Family/Significant other contact made: No, will contact:  if consent is provided Patient understands diagnosis: Yes. Discussing patient identified problems/goals with staff: Yes. Medical problems stabilized or resolved: Yes. Denies suicidal/homicidal ideation: Yes. Issues/concerns per patient self-inventory: No.   New problem(s) identified: No, Describe:  none  New Short Term/Long Term Goal(s):  medication stabilization, elimination of SI thoughts, development of comprehensive mental wellness plan.   Patient Goals:  "To go home"  Discharge Plan or Barriers: Patient recently admitted. CSW will continue to follow and assess for appropriate referrals and possible discharge planning.   Reason for Continuation of Hospitalization:  Aggression Mania Medication stabilization  Estimated Length of Stay: 3-5 days  Attendees: Patient: Nancy Drake 08/07/2020   Physician: Ernie Hew, MD 08/07/2020   Nursing:  08/07/2020  RN Care Manager: 08/07/2020  Social Worker: Darletta Moll, LCSW 08/07/2020  Recreational Therapist:  08/07/2020   Other:  08/07/2020   Other:  08/07/2020   Other: 08/07/2020     Scribe for Treatment Team: Vassie Moselle, LCSW 08/07/2020 10:15 AM
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
## 2020-08-07 NOTE — Tx Team (Signed)
Initial Treatment Plan 08/07/2020 1:52 AM [facility] NWM:[id number]    PATIENT STRESSORS: Other: issues with her neighbors   PATIENT STRENGTHS: Average or above average intelligence Capable of independent living Communication skills Supportive family/friends   PATIENT IDENTIFIED PROBLEMS: Manic behavior  Medication non-compliance                   DISCHARGE CRITERIA:  Improved stabilization in mood, thinking, and/or behavior Verbal commitment to aftercare and medication compliance  PRELIMINARY DISCHARGE PLAN: Attend aftercare/continuing care group Attend PHP/IOP Outpatient therapy Return to previous living arrangement  PATIENT/FAMILY INVOLVEMENT: This treatment plan has been presented to and reviewed with the patient, [facility], and/or family member.  The patient and family have been given the opportunity to ask questions and make suggestions.  Lajoyce Corners, RN 08/07/2020, 1:52 AM

## 2020-08-07 NOTE — BHH Group Notes (Signed)
[place] LCSW Group Therapy  08/07/2020 2:25 PM  Type of Therapy/Topic: Group Therapy: Emotion Regulation  Participation Level: Did Not Attend  Description of Group: The purpose of this group is to assist patients in learning to regulate negative emotions and experience positive emotions. Patients will be guided to discuss ways in which they have been vulnerable to their negative emotions. These vulnerabilities will be juxtaposed with experiences of positive emotions or situations, and patients will be challenged to use positive emotions to combat negative ones. Special emphasis will be placed on coping with negative emotions in conflict situations, and patients will process healthy conflict resolution skills.  Therapeutic Goals: 1. Patient will identify two positive emotions or experiences to reflect on in order to balance out negative emotions 2. Patient will label two or more emotions that they find the most difficult to experience 3. Patient will demonstrate positive conflict resolution skills through discussion and/or role plays  Summary of Patient Progress: Patient did not attend.  Therapeutic Modalities: Cognitive Behavioral Therapy Feelings Identification Dialectical Behavioral Therapy     Illene Labrador Amiere Cawley 08/07/2020, 2:25 PM

## 2020-08-07 NOTE — Progress Notes (Signed)
Recreation Therapy Notes  Date: 12.8.21 Time: 0950 Location: 500 Hall Dayroom  Group Topic: Coping Skills  Goal Area(s) Addresses:  Patient will identify positive coping skills. Patient will identify benefit of using coping skills.  Intervention: Worksheet; Pencils  Activity: Mind Map.  LRT and patients came up with 8 (anger, anxiety, communication, family/people, stressed, emotions, needs/wants and self care) instances in which coping skills would be used.  Patients were to then come up with at least 3 coping skills to deal with each area identified.  Education: Radiographer, therapeutic, Dentist.   Education Outcome: Acknowledges understanding/In group clarification offered/Needs additional education.   Clinical Observations/Feedback: Pt did not attend group.    Victorino Sparrow, LRT/CTRS         Victorino Sparrow A 08/07/2020 12:05 PM

## 2020-08-07 NOTE — Progress Notes (Signed)
Patient ID: Nancy Drake, female   DOB: 04-21-1961, 59 y.o.   MRN: [id number]  D: Pt here IVC from [facility]. Pt denies SI/HI/AVH and pain at this time. Pt says she is here because of her neighbors. "I don't do anything. They always accuse me of things. My driving is fine. I don't drive fast. The ones who drive fast are the ones with kids." Pt was accused of driving recklessly and fast through her neighborhood. Pt was also accused of harassing her neighbors. Pt has not been on any psych meds for some time. "I was told that I didn't need them anymore. My doctor wanted to take me off Risperdal. I don't need any meds but I know I'll get some here anyway." Pt is very tired. Was given Zyprexa 10 mg and Ativan 1 mg in the ED. Pt states that she has no stressors right now other than her neighbors blaming her for things. Pt previously at [facility] twice in 2020.  Pt doesn't know what she wants to work on. She does not think she needs medication. Pt counts her sister, [name] as her main support. Pt also has an adult daughter who is studying for a masters degree abroad in [place]. Pt was living with and caring for her father, who passed away in 07-10-20. Now she lives alone.   A: Pt was offered support and encouragement. Pt is cooperative during assessment. VS assessed and admission paperwork signed. Belongings searched and contraband items placed in locker. Non-invasive skin search completed: pt doesn't have any marks or scars on skin. Pt offered food and drink and both refused. Pt wants to sleep. Pt introduced to unit milieu by nursing staff. Q 15 minute checks were started for safety.   R: Pt in room. Pt safety maintained on unit.
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
## 2020-08-08 MED ORDER — RISPERIDONE 1 MG PO TBDP
3.0000 mg | ORAL_TABLET | Freq: Two times a day (BID) | ORAL | Status: DC
  Administered 2020-08-08 – 2020-08-11 (×6): 3 mg via ORAL
  Filled 2020-08-08 (×8): qty 3

## 2020-08-08 NOTE — BHH Group Notes (Signed)
Occupational Therapy Group Note Date: 08/08/2020 Group Topic/Focus: Socialization/Social Skills  Group Description: Group encouraged increased social engagement and participation through discussion/activity focused on improving socialization and age appropriate social skills. Patients were encouraged to fill out a structured worksheet with the following three prompts: one thing I want you to know about me, the hardest thing that I am dealing with today is, and my goal for today is... Discussion followed with patients sharing their responses and then transitioned into an interactive "Name 5" activity to promote socialization and orientation.   Therapeutic Goal(s): Demonstrate age-appropriate social skills and socialization within a structured group setting Demonstrate orientation to topic and identify relevant responses to group discussion. Participation Level: Active   Participation Quality: Independent   Behavior: Calm, Cooperative and Interactive   Speech/Thought Process: Disorganized   Affect/Mood: Full range   Insight: Limited   Judgement: Limited   Individualization: Farhiya was active in her participation of discussion and completing worksheet, however presented as disorganized at the start of group. Pt asked this writer a disorganized question about "three white chaplains and one black priest", though other remained relevant and on-topic to group discussion.   Modes of Intervention: Discussion, Education and Socialization  Patient Response to Interventions:  Attentive, Engaged and Receptive   Plan: Continue to engage patient in OT groups 2 - 3x/week.  08/08/2020  Ponciano Ort, MOT, OTR/L
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
## 2020-08-08 NOTE — Progress Notes (Signed)
   08/08/20 0625  Psych Admission Type (Psych Patients Only)  Admission Status Involuntary  Psychosocial Assessment  Patient Complaints Sadness (grieving loss of her father and a child)  Eye Contact Brief  Facial Expression Sad  Affect Sad  Speech Slow  Interaction Assertive  Motor Activity Slow  Appearance/Hygiene In scrubs  Behavior Characteristics Cooperative;Appropriate to situation  Mood Anxious  Thought Process  Coherency Circumstantial;Tangential  Content Blaming others  Delusions Persecutory  Perception WDL  Hallucination None reported or observed  Judgment Poor  Confusion None  Danger to Self  Current suicidal ideation? Denies  Danger to Others  Danger to Others None reported or observed   Pt slept the entire shift. Pt this morning is sad d/t the fact that her father passed away in 07-07-23. When asked about her grief and if she has allowed herself to grieve pt states, "Not really. I stay busy at home. I help others grieve more than myself." Pt still talking about the neighbor that dislikes her. Pt minimizing her actions before arriving at [facility].

## 2020-08-08 NOTE — Progress Notes (Signed)
Pt reports that she is feeling better now that she is back on her medication. Pt denied SI/HI/AVH.  Pt stated that she is grieving the loss of her dad and thinking about her family such as her daughter.  Pt attending groups and interacting with her peers on the unit.  Pt appears to be in no acute distress and is coping well at this time.  RN provided support, assessed for needs and concerns and administered medications.  Pt remains safe with q 15 min checks in place.

## 2020-08-08 NOTE — Progress Notes (Signed)
Recreation Therapy Notes  Date: 12.9.21 Time: 1000 Location: 500 Hall Dayroom  Group Topic: Self-Esteem  Goal Area(s) Addresses:  Patient will identify positive character traits about themselves.  Patient will identify positive activities they like. Patient will identify any positive things that sets them apart from others.  Behavioral Response: Engaged  Intervention: Visual merchandiser, safety scissors, glue sticks, magazines  Activity: Collage.  Patients were to create a collage, using the supplies provided, that would highlight any positive characteristics or positive activities that describes them using any pictures or words from the magazines.  Education:  Self-esteem, Discharge planning  Education Outcome: Acknowledges understanding/In group clarification offered/Needs additional education  Clinical Observations/Feedback: Pt left early but was able to complete the collage.  Pt used pictures to show her love of meditation, the ocean, the mountains and horseback riding.  Pt was pleasant and engaged for the time she was in group.    Victorino Sparrow, LRT/CTRS     Victorino Sparrow A 08/08/2020 12:34 PM

## 2020-08-08 NOTE — Progress Notes (Signed)
   08/08/20 2200  Psych Admission Type (Psych Patients Only)  Admission Status Involuntary  Psychosocial Assessment  Patient Complaints Anxiety  Eye Contact Brief  Facial Expression Sad  Affect Sad  Speech Slow  Interaction Assertive  Motor Activity Slow  Appearance/Hygiene In scrubs  Behavior Characteristics Cooperative  Mood Sad  Thought Process  Coherency Tangential  Content WDL  Delusions WDL  Perception WDL  Hallucination None reported or observed  Judgment WDL  Confusion None  Danger to Self  Current suicidal ideation? Denies  Danger to Others  Danger to Others None reported or observed

## 2020-08-09 DIAGNOSIS — F23 Brief psychotic disorder: Secondary | ICD-10-CM

## 2020-08-09 DIAGNOSIS — I428 Other cardiomyopathies: Secondary | ICD-10-CM

## 2020-08-09 DIAGNOSIS — I5022 Chronic systolic (congestive) heart failure: Secondary | ICD-10-CM

## 2020-08-09 DIAGNOSIS — Z950 Presence of cardiac pacemaker: Secondary | ICD-10-CM

## 2020-08-09 DIAGNOSIS — F419 Anxiety disorder, unspecified: Secondary | ICD-10-CM

## 2020-08-09 DIAGNOSIS — F32A Depression, unspecified: Secondary | ICD-10-CM

## 2020-08-09 DIAGNOSIS — I10 Essential (primary) hypertension: Secondary | ICD-10-CM

## 2020-08-09 LAB — T3, FREE: T3, Free: 3 pg/mL (ref 2.0–4.4)

## 2020-08-09 NOTE — Progress Notes (Signed)
   08/09/20 2000  Psych Admission Type (Psych Patients Only)  Admission Status Involuntary  Psychosocial Assessment  Patient Complaints Anxiety  Eye Contact Brief  Facial Expression Sad  Affect Sad  Speech Slow  Interaction Assertive  Motor Activity Slow  Appearance/Hygiene In scrubs  Behavior Characteristics Cooperative  Mood Pleasant;Anxious  Thought Process  Coherency Tangential  Content WDL  Delusions WDL  Perception WDL  Hallucination None reported or observed  Judgment WDL  Confusion None  Danger to Self  Current suicidal ideation? Denies  Danger to Others  Danger to Others None reported or observed

## 2020-08-09 NOTE — [facility] Group Notes (Signed)
[facility] LCSW Group Therapy  08/09/2020 1:48 PM  Type of Therapy:  Psychoeducational Skills  Participation Level:  Minimal  Participation Quality:  Sharing  Affect:  Appropriate  Cognitive:  Appropriate  Insight:  Developing/Improving  Engagement in Therapy:  Engaged  Modes of Intervention:  Discussion and Rapport Building  Summary of Progress/Problems: Patient came to group half way through. Patient was able to jump into group discussion right away.   Issa Kosmicki A Huong Luthi 08/09/2020, 1:48 PM

## 2020-08-09 NOTE — Progress Notes (Signed)
Pt woke stating she felt nervous, pt educated on Vistaril, pt took it per [facility]

## 2020-08-09 NOTE — Progress Notes (Signed)
Recreation Therapy Notes  Date: 12.10.21 Time: 1000 Location: 500 Hall Dayroom  Group Topic: Wellness  Goal Area(s) Addresses:  Patient will define components of whole wellness. Patient will verbalize benefit of whole wellness.  Intervention: Music  Activity: Exercise.  LRT led patients in a series of stretches to loosen up the body.  Patients were then given the opportunity to lead the group in exercises of their choosing.  The group was to get at least 30 minutes of exercise.  Patients could take breaks and get water as needed.  Education: Wellness, Dentist.   Education Outcome: Acknowledges education/In group clarification offered/Needs additional education.   Clinical Observations/Feedback: Pt did not attend group session.   Victorino Sparrow, LRT/CTRS        Ria Comment, Joncarlo Friberg A 08/09/2020 11:40 AM
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
## 2020-08-09 NOTE — Progress Notes (Signed)
Dar Note: Patient presents with anxious affect and mood.  Denies suicidal thoughts, auditory and visual hallucinations.  Medication given as prescribed.  Routine safety checks maintained.  Described energy level as normal and concentration as good.  Attended group and participated.  Patient is visible in milieu interacting with staff and peers.  Patient is safe on and off the unit.

## 2020-08-09 NOTE — BHH Suicide Risk Assessment (Signed)
[place] INPATIENT:  Family/Significant Other Suicide Prevention Education  Suicide Prevention Education:  Contact Attempts: sister [name] [phone]), has been identified by the patient as the family member/significant other with whom the patient will be residing, and identified as the person(s) who will aid the patient in the event of a mental health crisis.  With written consent from the patient, two attempts were made to provide suicide prevention education, prior to and/or following the patient's discharge.  We were unsuccessful in providing suicide prevention education.  A suicide education pamphlet was given to the patient to share with family/significant other.   Date and time of first attempt: 08/09/20 CSW left a HIPAA compliant voicemail for Manuela Schwartz D'Ruiz. Date and time of second attempt: Second attempt still needs to be made.   Darletta Moll MSW, LCSW Clincal Social Worker  [facility]

## 2020-08-10 NOTE — Progress Notes (Signed)
   08/10/20 1000  Psych Admission Type (Psych Patients Only)  Admission Status Involuntary  Psychosocial Assessment  Patient Complaints Anxiety  Eye Contact Brief  Facial Expression Sad  Affect Sad  Speech Slow  Interaction Assertive  Motor Activity Slow  Appearance/Hygiene In scrubs  Behavior Characteristics Cooperative  Mood Anxious  Thought Process  Coherency Tangential  Content WDL  Delusions WDL  Perception WDL  Hallucination None reported or observed  Judgment WDL  Confusion None  Danger to Self  Current suicidal ideation? Denies  Danger to Others  Danger to Others None reported or observed  Dar Note: Patient presents with anxious affect and irritable mood.  Preoccupied with getting discharge.  States she is bored being here.  Denies suicidal thoughts, auditory and visual hallucinations.  Patient is safe on the unit.

## 2020-08-10 NOTE — Progress Notes (Signed)
Adult Psychoeducational Group Note  Date:  08/10/2020 Time:  8:56 PM  Group Topic/Focus:  Wrap-Up Group:   The focus of this group is to help patients review their daily goal of treatment and discuss progress on daily workbooks.  Participation Level:  Did Not Attend  Participation Quality:  Did Not Atttend  Affect:  Did Not Atttend  Cognitive:  Did Not Atttend  Insight: None  Engagement in Group:  Did Not Atttend  Modes of Intervention:  Did Not Atttend  Additional Comments:  Pt did not attend evening wrap up group tonight.  Candy Sledge 08/10/2020, 8:56 PM

## 2020-08-10 NOTE — BHH Group Notes (Signed)
.  Psychoeducational Group Note  Date: 08-10-20 Time: 0900-1000    Goal Setting   Purpose of Group: This group helps to provide patients with the steps of setting a goal that is specific, measurable, attainable, realistic and time specific. A discussion on how we keep ourselves stuck with negative self talk.    Participation Level:  Did not attend   Nancy Drake

## 2020-08-10 NOTE — BHH Group Notes (Signed)
[facility] Group Notes: (Clinical Social Work)   08/10/2020      Type of Therapy:  Group Therapy   Participation Level:  Did Not Attend - was invited both individually by MHT and by overhead announcement, chose not to attend.   Selmer Dominion, LCSW 08/10/2020, 1:00 PM
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
## 2020-08-10 NOTE — Progress Notes (Addendum)
   08/10/20 2015  Psych Admission Type (Psych Patients Only)  Admission Status Involuntary  Psychosocial Assessment  Patient Complaints Anxiety;Sadness  Eye Contact Fair  Facial Expression Sad  Affect Sad  Interaction Assertive  Motor Activity Slow  Appearance/Hygiene In scrubs  Behavior Characteristics Cooperative  Mood Anxious  Thought Process  Coherency Tangential  Content WDL  Delusions None reported or observed  Perception WDL  Hallucination None reported or observed  Judgment WDL  Confusion None  Danger to Self  Current suicidal ideation? Denies  Danger to Others  Danger to Others None reported or observed   Pt seen outside her room. Denies SI, HI, AVH and pain. Endorses anxiety 5/10. Talks about how she doesn't need to be here. Rapid speech and tangential.
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
## 2020-08-11 ENCOUNTER — Encounter (HOSPITAL_COMMUNITY): Payer: Self-pay | Admitting: Psychiatric/Mental Health

## 2020-08-11 MED ORDER — CARVEDILOL 25 MG PO TABS: 25.0000 mg | ORAL_TABLET | Freq: Every day | ORAL | 0 refills | Status: AC

## 2020-08-11 MED ORDER — HYDROXYZINE HCL 25 MG PO TABS: 25.0000 mg | ORAL_TABLET | Freq: Three times a day (TID) | ORAL | 0 refills | Status: DC | PRN

## 2020-08-11 MED ORDER — RISPERIDONE 3 MG PO TABS
3.0000 mg | ORAL_TABLET | Freq: Two times a day (BID) | ORAL | Status: DC
  Administered 2020-08-11: 3 mg via ORAL
  Filled 2020-08-11 (×6): qty 1

## 2020-08-11 MED ORDER — RISPERIDONE 3 MG PO TABS: 3.0000 mg | ORAL_TABLET | Freq: Two times a day (BID) | ORAL | 0 refills | Status: DC

## 2020-08-11 MED ORDER — WHITE PETROLATUM EX OINT
TOPICAL_OINTMENT | CUTANEOUS | Status: AC
  Administered 2020-08-11: 1
  Filled 2020-08-11: qty 5

## 2020-08-11 MED ORDER — RISPERIDONE MICROSPHERES ER 37.5 MG IM SRER
37.5000 mg | INTRAMUSCULAR | Status: DC
  Administered 2020-08-11: 37.5 mg via INTRAMUSCULAR
  Filled 2020-08-11: qty 2

## 2020-08-11 MED ORDER — RISPERIDONE MICROSPHERES ER 37.5 MG IM SRER: 37.5000 mg | INTRAMUSCULAR | 0 refills | Status: AC

## 2020-08-11 MED ORDER — FERROUS SULFATE 325 (65 FE) MG PO TABS: 325.0000 mg | ORAL_TABLET | Freq: Every day | ORAL | 0 refills | Status: DC
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
## 2020-08-11 NOTE — BH Assessment (Signed)
D: Pt A & O X 3. Presents with congruent affect and mood. Denies SI, HI, AVH and pain at this time. D/C home as ordered. Picked up in lobby by "My ex-husband". A: D/C instructions reviewed with pt including electronic prescriptions and follow up appointment; compliance encouraged. All belongings from locker 44 returned to pt at time of departure. Risperdal  Consta given as well prior to d/c. Scheduled medications given with verbal education and effects monitored. Safety checks maintained without incident till time of d/c.  R: Pt receptive to care. Compliant with medications when offered. Denies adverse drug reactions when assessed. Verbalized understanding related to d/c instructions. Pt signed belonging sheet in agreement with items received from locker. Pt ambulatory with a steady gait. Appears to be in no physical distress at time of departure.

## 2020-08-11 NOTE — BHH Suicide Risk Assessment (Signed)
[place] INPATIENT:  Family/Significant Other Suicide Prevention Education  Suicide Prevention Education:  Education Completed; sister [name] [phone]), ,  (name of family member/significant other) has been identified by the patient as the family member/significant other with whom the patient will be residing, and identified as the person(s) who will aid the patient in the event of a mental health crisis (suicidal ideations/suicide attempt).  With written consent from the patient, the family member/significant other has been provided the following suicide prevention education, prior to the and/or following the discharge of the patient.  The suicide prevention education provided includes the following:  Suicide risk factors  Suicide prevention and interventions  National Suicide Hotline telephone number  [facility] assessment telephone number  [facility] Emergency Assistance [place] and/or Residential Mobile Crisis Unit telephone number  Request made of family/significant other to:  Remove weapons (e.g., guns, rifles, knives), all items previously/currently identified as safety concern.    Remove drugs/medications (over-the-counter, prescriptions, illicit drugs), all items previously/currently identified as a safety concern.  The family member/significant other verbalizes understanding of the suicide prevention education information provided.  The family member/significant other agrees to remove the items of safety concern listed above.  Berlin Hun Grossman-Orr 08/11/2020, 11:34 AM

## 2020-08-11 NOTE — [facility] Group Notes (Signed)
[place] LCSW Group Therapy Note  Date/Time:  08/11/2020  11:00AM-12:00PM  Type of Therapy and Topic:  Group Therapy:  Music and Mood  Participation Level:  Minimal   Description of Group: In this process group, members listened to a variety of genres of music and identified that different types of music evoke different responses.  Patients were encouraged to identify music that was soothing for them and music that was energizing for them.  Patients discussed how this knowledge can help with wellness and recovery in various ways including managing depression and anxiety as well as encouraging healthy sleep habits.    Therapeutic Goals: Patients will explore the impact of different varieties of music on mood Patients will verbalize the thoughts they have when listening to different types of music Patients will identify music that is soothing to them as well as music that is energizing to them Patients will discuss how to use this knowledge to assist in maintaining wellness and recovery Patients will explore the use of music as a coping skill  Summary of Patient Progress:  At the beginning of group, patient was not present.  As soon as she entered the room, she started talking to other patients.  The music was stopped and they were all requested to listen to the songs and see how the songs affected them.  She was apologetic but then kept talking, which others did as well.  As a result, group was ended early.  Therapeutic Modalities: Solution Focused Brief Therapy Activity   Selmer Dominion, LCSW

## 2020-08-11 NOTE — Progress Notes (Signed)
  [facility] Adult Case Management Discharge Plan :  Will you be returning to the same living situation after discharge:  Yes,  lives alone At discharge, do you have transportation home?: Yes,  either ex-husband or CSW will arrange Do you have the ability to pay for your medications: Yes,  insurance  Release of information consent forms completed and emailed to Medical Records, then turned in to Medical Records by CSW.   Patient to Follow up at:  Follow-up Information    [place] [facility]. Go on 08/20/2020.   Why: You have a hospital follow-up appointment on 08/20/20 at 3:00 pm to establish care for therapy and medication management.  This appointment will be held in person.  Please arrive 15 minutes prior and bring your photo ID and insurance card.  Contact information: [address] [place], [place] [address]  Phone: [phone] Fax: [phone]              Next level of care provider has access to [place] and Suicide Prevention discussed: Yes,  with sister  Have you used any form of tobacco in the last 30 days? (Cigarettes, Smokeless Tobacco, Cigars, and/or Pipes): No  Has patient been referred to the Quitline?: N/A patient is not a smoker  Patient has been referred for addiction treatment: N/A  Maretta Los, LCSW 08/11/2020, 11:33 AM

## 2020-08-11 NOTE — Progress Notes (Signed)
The focus of this group is to help patients establish daily goals to achieve during treatment and discuss how the patient can incorporate goal setting into their daily lives to aide in recovery.The patient attended group and participated .

## 2020-08-23 ENCOUNTER — Other Ambulatory Visit (HOSPITAL_COMMUNITY): Payer: Self-pay | Admitting: Psychiatry

## 2020-09-08 ENCOUNTER — Other Ambulatory Visit (HOSPITAL_COMMUNITY): Payer: Self-pay | Admitting: Psychiatry

## 2020-09-09 ENCOUNTER — Other Ambulatory Visit (HOSPITAL_COMMUNITY): Payer: Self-pay | Admitting: Psychiatry

## 2020-10-08 ENCOUNTER — Other Ambulatory Visit (HOSPITAL_COMMUNITY): Payer: Self-pay | Admitting: Psychiatry

## 2020-11-19 ENCOUNTER — Other Ambulatory Visit: Payer: Self-pay

## 2020-11-19 ENCOUNTER — Encounter (HOSPITAL_BASED_OUTPATIENT_CLINIC_OR_DEPARTMENT_OTHER): Payer: Self-pay | Admitting: Family Medicine

## 2020-11-19 ENCOUNTER — Ambulatory Visit (INDEPENDENT_AMBULATORY_CARE_PROVIDER_SITE_OTHER): Payer: 59 | Admitting: Family Medicine

## 2020-11-19 VITALS — BP 162/112 | HR 63 | Ht 61.0 in | Wt 159.6 lb

## 2020-11-19 DIAGNOSIS — F319 Bipolar disorder, unspecified: Secondary | ICD-10-CM | POA: Diagnosis not present

## 2020-11-19 DIAGNOSIS — I428 Other cardiomyopathies: Secondary | ICD-10-CM | POA: Diagnosis not present

## 2020-11-19 DIAGNOSIS — I10 Essential (primary) hypertension: Secondary | ICD-10-CM | POA: Diagnosis not present

## 2020-11-19 DIAGNOSIS — Z6830 Body mass index (BMI) 30.0-30.9, adult: Secondary | ICD-10-CM | POA: Diagnosis not present

## 2020-11-19 NOTE — Assessment & Plan Note (Signed)
Uncontrolled, not adherent to medications as prescribed, poor insight Reports that she has upcoming appointment with psychiatry, encouraged to continue with close follow-up with psychiatry

## 2020-11-19 NOTE — Assessment & Plan Note (Signed)
Continues to follow with cardiology, stress importance of continuing close follow-up Continue with beta-blocker and ACE inhibitor as prescribed Lifestyle modifications as above

## 2020-11-19 NOTE — Progress Notes (Signed)
New Patient Office Visit  Subjective:  Patient ID: Nancy Drake, female    DOB: [birth year]  Age: 60 y.o. MRN: [id number]  CC:  Chief Complaint  Patient presents with  . Establish Care    HPI Nancy Drake is a 60 year old female presenting to establish clinic.  She has a past medical history of hypertension, nonischemic cardiomyopathy, bipolar disorder.  Patient follows with cardiology related to nonischemic cardiomyopathy, biventricular pacemaker, hypertension.  Next appointment cardiology is in April.  Patient has had inconsistent follow-up with psychiatry related to the management of her bipolar disorder.  She also indicates that she is not currently taking her risperidone and review of the chart reveals that she has not been adherent to her psychiatric medications.  Today, patient denies any issues with chest pain, shortness of breath, lightheadedness, dizziness, no recent visual changes.  She indicates that she is taking her carvedilol, uncertain regarding her lisinopril-hydrochlorothiazide.  Past Medical History:  Diagnosis Date  . Anxiety    lost a child , sees psych  . Bipolar affective disorder, manic, severe, with psychotic behavior ([place])   . Biventricular cardiac pacemaker -[employer]    DOI 2013  . Cardiomyopathy nonischemic    dx after a syncope 10-2010  . Complete heart block ([place]) 03-2012  . GAD (generalized anxiety disorder)   . Mole of skin    bx 2013/06/21  . Schizophrenia ([place])   . Thyroid disease    used to see Dr Debbora Presto    Past Surgical History:  Procedure Laterality Date  . BI-VENTRICULAR PACEMAKER INSERTION N/A 04/11/2012   Procedure: BI-VENTRICULAR PACEMAKER INSERTION (CRT-P);  Surgeon: Deboraha Sprang, MD;  Location: [facility] CATH LAB;  Service: Cardiovascular;  Laterality: N/A;  . CESAREAN SECTION    . COLONOSCOPY     2010  . LEAD REVISION N/A 04/12/2012   Procedure: LEAD REVISION;  Surgeon: Evans Lance, MD;  Location: [facility] CATH LAB;  Service:  Cardiovascular;  Laterality: N/A;  . PACEMAKER INSERTION  03-2012  . TEMPORARY PACEMAKER INSERTION Right 04/08/2012   Procedure: TEMPORARY PACEMAKER INSERTION;  Surgeon: Burnell Blanks, MD;  Location: [facility] CATH LAB;  Service: Cardiovascular;  Laterality: Right;    Family History  Problem Relation Age of Onset  . Other Other        uncle with pacemaker  . Heart disease Other        PACERMAKER  . Breast cancer Other        cousin  . Diabetes Mother   . Cancer Father        prostate cancer  . Colon cancer Neg Hx     Social History   Socioeconomic History  . Marital status: Divorced    Spouse name: Not on file  . Number of children: 2  . Years of education: Not on file  . Highest education level: Not on file  Occupational History  . Occupation: not working at present    Employer: [employer] Use  . Smoking status: Never Smoker  . Smokeless tobacco: Never Used  Vaping Use  . Vaping Use: Never used  Substance and Sexual Activity  . Alcohol use: Not Currently    Comment: socially   . Drug use: No  . Sexual activity: Yes    Birth control/protection: Post-menopausal  Other Topics Concern  . Not on file  Social History Narrative   Born in [place], father passed away Jun 21, 2020   2 child, lost one    Social Determinants  of Health   Financial Resource Strain: Not on file  Food Insecurity: Not on file  Transportation Needs: Not on file  Physical Activity: Not on file  Stress: Not on file  Social Connections: Not on file  Intimate Partner Violence: Not on file    Objective:   Today's Vitals: BP (!) 162/112   Pulse 63   Ht 5\' 1"  (1.549 m)   Wt 159 lb 9.6 oz (72.4 kg)   SpO2 98%   BMI 30.16 kg/m   Physical Exam  60 year old female in no acute distress Cardiovascular [place] regular rate and rhythm, no murmurs appreciated Lungs clear to auscultation bilaterally Patient was tangential during office visit with discussions regarding prior physicians she has  seen, family that she has in [place], loss of friends and family members over the years.  Patient did have some emotional lability during encounter.  Assessment & Plan:   Problem List Items Addressed This Visit      Cardiovascular and Mediastinum   Nonischemic cardiomyopathy ([place])    Continues to follow with cardiology, stress importance of continuing close follow-up Continue with beta-blocker and ACE inhibitor as prescribed Lifestyle modifications as above      Relevant Medications   lisinopril-hydrochlorothiazide (ZESTORETIC) 10-12.5 MG tablet   Essential hypertension, benign - Primary    Blood pressure elevated in the office today, patient relates that this is related to his increased stress Currently prescribed carvedilol, lisinopril, hydrochlorothiazide -uncertain of adherence at this time Stressed importance of taking medications as prescribed Patient has upcoming follow-up with cardiology Discussed importance of lifestyle interventions including appropriate diet and exercise      Relevant Medications   lisinopril-hydrochlorothiazide (ZESTORETIC) 10-12.5 MG tablet     Other   Bipolar 1 disorder (HCC)    Uncontrolled, not adherent to medications as prescribed, poor insight Reports that she has upcoming appointment with psychiatry, encouraged to continue with close follow-up with psychiatry      BMI 30.0-30.9,adult    Lifestyle modifications including appropriate diet and exercise discussed as above Plan for continued monitoring of BMI at follow-up visit        Patient is due for colonoscopy, has appointment with gastroenterologist in 2 days, she will discuss further with them regarding colon cancer screening Will request records from outside specialist, particularly including GYN to review for appropriate cancer screening measures  Plan to check labs at follow-up visit, likely including CBC, CMP, A1c, lipid panel  Outpatient Encounter Medications as of 11/19/2020   Medication Sig  . carvedilol (COREG) 25 MG tablet Take 1 tablet (25 mg total) by mouth daily.  Marland Kitchen lisinopril-hydrochlorothiazide (ZESTORETIC) 10-12.5 MG tablet Take 1 tablet by mouth daily.  . risperiDONE microspheres (RISPERDAL CONSTA) 37.5 MG injection Inject 2 mLs (37.5 mg total) into the muscle every 14 (fourteen) days. First dose given on 08/11/20. Next dose due on 08/25/20  . [DISCONTINUED] ferrous sulfate 325 (65 FE) MG tablet Take 1 tablet (325 mg total) by mouth daily with breakfast. (Patient not taking: Reported on 11/19/2020)  . [DISCONTINUED] hydrOXYzine (ATARAX/VISTARIL) 25 MG tablet Take 1 tablet (25 mg total) by mouth 3 (three) times daily as needed for anxiety. (Patient not taking: Reported on 11/19/2020)  . [DISCONTINUED] risperiDONE (RISPERDAL) 3 MG tablet Take 1 tablet (3 mg total) by mouth 2 (two) times daily. (Patient not taking: Reported on 11/19/2020)   No facility-administered encounter medications on file as of 11/19/2020.   Spent 45 minutes on this patient encounter, including preparation, chart review, face-to-face counseling with  patient and coordination of care, and documentation of encounter  Follow-up: Return in about 6 months (around 05/22/2021) for follow up - in office.   Odalys Win J De [place], MD

## 2020-11-19 NOTE — Patient Instructions (Signed)
   Medication Instructions:  Your physician recommends that you continue on your current medications as directed. Please refer to the Current Medication list given to you today. --If you need a refill on any your medications before your next appointment, please call your pharmacy first. If no refills are authorized on file call the office.--  Lab Work: If you have labs (blood work) drawn today and your tests are completely normal, you will receive your results only by: Marland Kitchen MyChart Message (if you have MyChart) OR . A phone call from our staff. Please ensure you check your voicemail in the event that you authorized detailed messages to be left on a delegated number. If you have any lab test that is abnormal or we need to change your treatment, we will call you to review the results.  Follow-Up: Your next appointment:   Your physician recommends that you schedule a follow-up appointment in: 6 MONTHS with Dr. [place]  We recommend signing up for the patient portal called "MyChart".  Sign up information is provided on this After Visit Summary.  MyChart is used to connect with patients for Virtual Visits (Telemedicine).  Patients are able to view lab/test results, encounter notes, upcoming appointments, etc.  Non-urgent messages can be sent to your provider as well.   To learn more about what you can do with MyChart, go to [email or web address].    Thanks for letting us be apart of your health journey!!  Primary Care and Sports Medicine    Dr. de [place] and Worthy Keeler, DNP, AGNP

## 2020-11-19 NOTE — Assessment & Plan Note (Signed)
Blood pressure elevated in the office today, patient relates that this is related to his increased stress Currently prescribed carvedilol, lisinopril, hydrochlorothiazide -uncertain of adherence at this time Stressed importance of taking medications as prescribed Patient has upcoming follow-up with cardiology Discussed importance of lifestyle interventions including appropriate diet and exercise

## 2020-11-19 NOTE — Assessment & Plan Note (Signed)
Lifestyle modifications including appropriate diet and exercise discussed as above Plan for continued monitoring of BMI at follow-up visit

## 2020-11-25 ENCOUNTER — Ambulatory Visit (INDEPENDENT_AMBULATORY_CARE_PROVIDER_SITE_OTHER): Payer: 59 | Admitting: Psychiatry

## 2020-11-25 ENCOUNTER — Other Ambulatory Visit: Payer: Self-pay

## 2020-11-25 DIAGNOSIS — R69 Illness, unspecified: Secondary | ICD-10-CM | POA: Diagnosis not present

## 2020-11-25 DIAGNOSIS — F3112 Bipolar disorder, current episode manic without psychotic features, moderate: Secondary | ICD-10-CM
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
## 2020-12-23 ENCOUNTER — Other Ambulatory Visit (HOSPITAL_BASED_OUTPATIENT_CLINIC_OR_DEPARTMENT_OTHER): Payer: Self-pay | Admitting: Family Medicine

## 2020-12-23 ENCOUNTER — Telehealth (HOSPITAL_BASED_OUTPATIENT_CLINIC_OR_DEPARTMENT_OTHER): Payer: Self-pay

## 2020-12-23 DIAGNOSIS — Z1211 Encounter for screening for malignant neoplasm of colon: Secondary | ICD-10-CM

## 2020-12-23 NOTE — Telephone Encounter (Signed)
patient called office to inform Dr. De [place] that she cancelled her colonoscopy procedure due to scheduling conflicts and fear of infection, Pt requests to have cologuard instead Confirmed mailing address with patient and informed patient that cologuard order will be placed and testing materials and instructions will be mailed to her. Patient is aware and agreeable

## 2020-12-23 NOTE — Progress Notes (Signed)
Order placed for Cologuard per patient request to complete colon cancer screening

## 2021-05-21 ENCOUNTER — Ambulatory Visit (HOSPITAL_BASED_OUTPATIENT_CLINIC_OR_DEPARTMENT_OTHER): Payer: 59 | Admitting: Family Medicine

## 2021-05-22 ENCOUNTER — Ambulatory Visit (HOSPITAL_BASED_OUTPATIENT_CLINIC_OR_DEPARTMENT_OTHER): Payer: 59 | Admitting: Family Medicine

## 2021-06-05 ENCOUNTER — Encounter (HOSPITAL_BASED_OUTPATIENT_CLINIC_OR_DEPARTMENT_OTHER): Payer: Self-pay | Admitting: Family Medicine

## 2022-04-23 ENCOUNTER — Ambulatory Visit (INDEPENDENT_AMBULATORY_CARE_PROVIDER_SITE_OTHER): Payer: Commercial Managed Care - HMO | Admitting: Family Medicine

## 2022-04-23 ENCOUNTER — Encounter (HOSPITAL_BASED_OUTPATIENT_CLINIC_OR_DEPARTMENT_OTHER): Payer: Self-pay | Admitting: Family Medicine

## 2022-04-23 DIAGNOSIS — F319 Bipolar disorder, unspecified: Secondary | ICD-10-CM | POA: Diagnosis not present

## 2022-04-23 DIAGNOSIS — R42 Dizziness and giddiness: Secondary | ICD-10-CM | POA: Diagnosis not present

## 2022-04-23 NOTE — Assessment & Plan Note (Signed)
Patient was seen at emergency department at [facility] earlier this month due to experiencing dizziness.  This occurred after having procedure for pacemaker generator replacement.  Symptoms occurred when she got up to go to the bathroom.  EMS was activated and she was transported to [place] at specific request as she did not want to go to the nearest facility which she reports was a [place] facility.  She did not need to be admitted after evaluation in emergency department.  She has not had any further issues since discharge from the emergency department.  She does have questions today regarding management of surgical site and overlying Steri-Strips. On exam, patient is in no acute distress, does have elevated blood pressure, normal pulse rate.  Inspection of surgical site does not show any erythema, redness, drainage.  Steri-Strips still in place overlying the area Discussed recommendation for following up with her cardiologist to further review management of wound site.  Today it appears to be healing well with no signs of infection.  Will defer to them regarding management of Steri-Strips and any specific instructions they may have for her.

## 2022-04-23 NOTE — Patient Instructions (Signed)
  Medication Instructions:  Your physician recommends that you continue on your current medications as directed. Please refer to the Current Medication list given to you today. --If you need a refill on any your medications before your next appointment, please call your pharmacy first. If no refills are authorized on file call the office.-- Lab Work: Your physician has recommended that you have lab work today: No If you have labs (blood work) drawn today and your tests are completely normal, you will receive your results via MyChart message OR a phone call from our staff.  Please ensure you check your voicemail in the event that you authorized detailed messages to be left on a delegated number. If you have any lab test that is abnormal or we need to change your treatment, we will call you to review the results.  Referrals/Procedures/Imaging: No  Follow-Up: Your next appointment:   Your physician recommends that you schedule a follow-up appointment in: 6 months with Dr. de [place].  You will receive a text message or e-mail with a link to a survey about your care and experience with us today! We would greatly appreciate your feedback!   Thanks for letting us be apart of your health journey!!  Primary Care and Sports Medicine   Dr. Raymond de Cuba   We encourage you to activate your patient portal called "MyChart".  Sign up information is provided on this After Visit Summary.  MyChart is used to connect with patients for Virtual Visits (Telemedicine).  Patients are able to view lab/test results, encounter notes, upcoming appointments, etc.  Non-urgent messages can be sent to your provider as well. To learn more about what you can do with MyChart, please visit --  [email or web address].    

## 2022-04-23 NOTE — Progress Notes (Signed)
    Procedures performed today:    None.  Independent interpretation of notes and tests performed by another provider:   None.  Brief History, Exam, Impression, and Recommendations:    BP (!) 187/105   Pulse 70   Ht '5\' 1"'$  (1.549 m)   Wt 177 lb 3.2 oz (80.4 kg)   SpO2 100%   BMI 33.48 kg/m   Dizziness Patient was seen at emergency department at [facility] earlier this month due to experiencing dizziness.  This occurred after having procedure for pacemaker generator replacement.  Symptoms occurred when she got up to go to the bathroom.  EMS was activated and she was transported to [place] at specific request as she did not want to go to the nearest facility which she reports was a [place] facility.  She did not need to be admitted after evaluation in emergency department.  She has not had any further issues since discharge from the emergency department.  She does have questions today regarding management of surgical site and overlying Steri-Strips. On exam, patient is in no acute distress, does have elevated blood pressure, normal pulse rate.  Inspection of surgical site does not show any erythema, redness, drainage.  Steri-Strips still in place overlying the area Discussed recommendation for following up with her cardiologist to further review management of wound site.  Today it appears to be healing well with no signs of infection.  Will defer to them regarding management of Steri-Strips and any specific instructions they may have for her.  Bipolar 1 disorder [facility]) Patient generally has poor insight into underlying diagnosis.  Has had several prior emergency department visits as well as inpatient stays related to acute psychosis.  She has worked with psychiatry in the past as well as counseling/therapy but unfortunately she has either not followed up or been discharged from practices in the past.  Adherence to medications has been questionable as she does not feel medications are necessary and  that she does not have any acute issues or diagnoses. She does not have a current psychiatrist, but is amenable to referral, referral placed today so that she may establish with new provider.  She previously was seeing Dr. Casimiro Needle  Return in about 6 months (around 10/24/2022).   ___________________________________________ Evelette Hollern de [place], MD, ABFM, CAQSM Primary Care and [place]

## 2022-04-23 NOTE — Assessment & Plan Note (Deleted)
Patient was seen at emergency department at [facility] earlier this month due to experiencing dizziness.  This occurred after having procedure for pacemaker generator replacement.  Symptoms occurred when she got up to go to the bathroom.  EMS was activated and she was transported to [place] at specific request as she did not want to go to the nearest facility which she reports was a [place] facility.  She did not need to be admitted after evaluation in emergency department.  She has not had any further issues since discharge from the emergency department.  She does have questions today regarding management of surgical site and overlying Steri-Strips. On exam, patient is in no acute distress, does have elevated blood pressure, normal pulse rate.  Inspection of surgical site does not show any erythema, redness, drainage.  Steri-Strips still in place overlying the area Discussed recommendation for following up with her cardiologist to further review management of wound site.  Today it appears to be healing well with no signs of infection.  Will defer to them regarding management of Steri-Strips and any specific instructions they may have for her.

## 2022-04-24 NOTE — Assessment & Plan Note (Signed)
Patient generally has poor insight into underlying diagnosis.  Has had several prior emergency department visits as well as inpatient stays related to acute psychosis.  She has worked with psychiatry in the past as well as counseling/therapy but unfortunately she has either not followed up or been discharged from practices in the past.  Adherence to medications has been questionable as she does not feel medications are necessary and that she does not have any acute issues or diagnoses. She does not have a current psychiatrist, but is amenable to referral, referral placed today so that she may establish with new provider.  She previously was seeing Dr. Casimiro Needle

## 2022-04-28 ENCOUNTER — Telehealth (HOSPITAL_BASED_OUTPATIENT_CLINIC_OR_DEPARTMENT_OTHER): Payer: Self-pay | Admitting: Family Medicine

## 2022-04-28 NOTE — Telephone Encounter (Signed)
Pt LVM with upfront regarding her past appt regarding her BP at the visit. Pt would like a call from [place] regarding this. Please advise.

## 2022-06-18 ENCOUNTER — Encounter (HOSPITAL_BASED_OUTPATIENT_CLINIC_OR_DEPARTMENT_OTHER): Payer: Self-pay

## 2022-06-18 ENCOUNTER — Inpatient Hospital Stay (HOSPITAL_BASED_OUTPATIENT_CLINIC_OR_DEPARTMENT_OTHER): Payer: Commercial Managed Care - HMO | Admitting: Family Medicine

## 2022-08-28 ENCOUNTER — Ambulatory Visit (INDEPENDENT_AMBULATORY_CARE_PROVIDER_SITE_OTHER): Payer: Commercial Managed Care - HMO | Admitting: Psychiatry

## 2022-08-28 DIAGNOSIS — F3112 Bipolar disorder, current episode manic without psychotic features, moderate: Secondary | ICD-10-CM

## 2022-08-28 NOTE — Progress Notes (Signed)
Psychotherapy Progress Note [facility] [name], PhD LP  Patient ID: Nancy Drake [facility]") -- goes by "Nancy Drake   MRN: [id number] Therapy format: Individual psychotherapy Date: 08/28/2022      Start: 11:16a     Stop: 12:06p     Time Spent: 50 min Location: In-person   Session narrative (presenting needs, interim history, self-report of stressors and symptoms, applications of prior therapy, status changes, and interventions made in session) Seen once before, Mar 2022, while in a manic state.  Similarly scattered today, and vague about interests.  Reports  she has gotten her 2nd pacemaker since last seen, at [facility], wanting to avoid [facility] for a set of unclear reasons.  Says she has a new PCP now, since November, Raymond de [place] at [facility], who has been insisting she see a Teacher, music.  She tried downstairs at [facility], but says "they're all over the place."  Wants an avenue to unpack all the things in her head, which she attempts to do today, including hx one of her twin daughters dying, and a lawsuit with OB about not catching strep B infection.  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  Convoluted claims about Rice's love child, named Norval Gable...  unable to decipher.    Alleges her [place] doctors know better about her and what her real problems are.  Hx on Risperdal, but claims her [place] psychiatrist wrote her a letter (produced at our first meeting, and now quite dated).  Carries the letter now, dated 2014, from her psychiatrist in [place] who served her [phone], and all it states is that she was not dx'd with Schizophrenia at the time, only a postpartum psychosis that responded well, with only limited relapses thereafter.  Not clear why carry it or what need at this point, but the original context for the letter reportedly had to do her  gynecologist lawsuit, somehow.  Consulting EHR, it shows a December 2021 hospitalization where she got injectable Risperdal, obviously out of concern that she would not be compliant on her own, which seems to be the case.  Only reason she recalls for going to the hospital then was conflict with a new neighbor she believes was involved in a conspiracy.  Says the police checked on her just last week, supposedly sent by [employer]  Another convoluted story, unable to decipher, but plausible that she came off manic and policy was to treat it as potentially dangerous to cover liability.  Eventually reveals she's known several suicides and other losses of children, and that her sister has been urging her to care less than she does.  Again not clear what this is about, either, seems  to presume [place] is aware of things not spoken.    In winding down a rambling visit, states her only real interest is in having an occasional Xanax made available to her to calm anxiety.  When asked, she does understand the difference between psychiatry and therapy.  Offered to refer to psychiatry, with the added suggestion that Xanax is not necessarily the right call and she should be open to other recommendations.  Says she would like to come back to therapy, agreed she could, and oriented how to schedule.   Therapeutic modalities: Ego-Supportive and pragmatic  Mental Status/Observations:  Appearance:   Casual     Behavior:  Monopolizing  Motor:  Normal  Speech/Language:   Generally fluent.  Noted speech errors -- "zaynax" for Xanax, "Clarinex" for Claritin D  Affect:  Appropriate  Mood:  hypomanic  Thought process:  loose associations  Thought content:    Delusions suspected  Sensory/Perceptual disturbances:    WNL  Orientation:  Fully oriented  Attention:  Good    Concentration:  Fair  Memory:  Subject to distortion  Insight:    Poor  Judgment:   Fair  Impulse Control:  Fair   Risk Assessment: Danger to  Self: No Self-injurious Behavior: No Danger to Others: No Physical Aggression / Violence: No Duty to Warn: No Access to Firearms a concern: No  Assessment of progress:  uncertain  Diagnosis:   ICD-10-CM   1. Bipolar I disorder, most recent episode (or current) manic, moderate ([place])  F31.12    r/o schizoaffective     Plan:  May schedule if interested and can organize it Open offer to refer to psychiatry for further assessment and plausible Xanax Return message to PCP that she is not sufficiently organized to profit from psychotherapy unless successfully medicated, and cannot guarantee her adherence to treatment  Other recommendations/advice as may be noted above Continue to utilize previously learned skills ad lib Maintain medication as prescribed and work faithfully with relevant prescriber(s) if any changes are desired or seem indicated Call the clinic on-call service, 988/hotline, 911, or present to [facility] or ER if any life-threatening psychiatric crisis Return for time as available. Already scheduled visit in this office 10/23/2022.  Blanchie Serve, PhD [name], PhD LP Clinical Psychologist, [facility] [address], [place], [place] [address] [phone]

## 2022-10-02 ENCOUNTER — Ambulatory Visit (HOSPITAL_BASED_OUTPATIENT_CLINIC_OR_DEPARTMENT_OTHER): Payer: Commercial Managed Care - HMO | Admitting: Family Medicine

## 2022-10-23 ENCOUNTER — Ambulatory Visit: Payer: Commercial Managed Care - HMO | Admitting: Psychiatry

## 2022-10-23 ENCOUNTER — Ambulatory Visit (HOSPITAL_BASED_OUTPATIENT_CLINIC_OR_DEPARTMENT_OTHER): Payer: Commercial Managed Care - HMO | Admitting: Family Medicine

## 2022-12-30 ENCOUNTER — Ambulatory Visit: Payer: Self-pay | Admitting: Psychiatry

## 2023-04-15 ENCOUNTER — Ambulatory Visit (HOSPITAL_BASED_OUTPATIENT_CLINIC_OR_DEPARTMENT_OTHER): Payer: Commercial Managed Care - HMO | Admitting: Family Medicine

## 2023-05-17 ENCOUNTER — Ambulatory Visit: Payer: Commercial Managed Care - HMO | Admitting: Psychiatry

## 2023-06-16 ENCOUNTER — Ambulatory Visit: Payer: Commercial Managed Care - HMO | Admitting: Psychiatry

## 2023-06-16 NOTE — Progress Notes (Signed)
Psychotherapy Progress Note [facility] Nancy Czar, PhD LP  Patient ID: Nancy Drake [facility] "Nancy Drake")    MRN: [id number] Date: 06/16/2023      Missed appt, 10am  Record review -- Return Pt, seen twice in two and half years, last in December 2023, both times rambling, tangential, and referencing suspicious behavior by others.  Brief review of available EHR shows she's been active this year with primary care in [facility] and cardiology in [place], both in the [facility] system, and a primary care in the [facility] system.  Family med and primary care encounters in the spring reference 2 separate c/o insect bites and observation of psychosis with suggestion to f/u with behavioral health.  Note in March with [facility] states that she has alleged her psychologist (referring to me) told her she does not need to take medication, thought this is not true, only presumed by the patient in the absence of effective communication in the 2 previous meetings, in which both times she proclaimed that her psychiatrist in [place] (going on 20 years ago) refuted the old diagnosis of schizophrenia and credited her symptoms entirely to hormonal issues at the time.  8/29 [facility] system phone note references a distressed caller who was tangential and very unclear, with failed attempts to reach her or voicemail on return calls.  Notably reported to have said at one point that she wouldn't care if she walked or drove into heavy oncoming traffic then stated she wanted an office appt with her cardiologist to get a handicapped placard.  Latest note (new PCP in the [facility] system, [place], dated 10/7) addresses her wish for a handicapped parking form (d/t cardiac history), warnings against talking with her previous providers at [facility], and reference to having had to take legal action before with health care providers, though she honestly states in this note that she has not been in regular mental health treatment, and  she is credibly noted to have been tangential and intermittently paranoid (e.g., how physicians have tried to control her), without manifesting any risk of harm to self or others.    Review of prior sessions indicates a longer history of changing PCPs, suggesting a more persistently paranoid orientation to health care, or at least to generalists, and the impulse to withdraw and change working relationships to manage the perceived threat of being controlled, and possibly the unacknowledged, underlying threat of being better recognized with a chronic mental illness.  Also noted that she failed to return after both prior visits with me, despite seeming to indicate that she wanted to continue and being told she could.  Further review of hospital record [facility], Dec 2021) has it that she went off her bipolar medication 10/26/2021after her father's death, leading to the decompensation that precipitated her IVC by [facility] police and the treatment decision to put her on an injectable antipsychotic.  When first seen Mar 2022, she was off medication over 3 months and had never followed through on her 1st outpatient followup dose of the injectable.  No show for 10am appt -- System states it was made 04/27/23, and as of 12:30p she has not been seen nor heard from about any need to cancel.  Review of prior appointment record here shows she never scheduled followup after the first visit, and she cancelled 3 others this year on 2/23, 5/1, and 9/16.  Given ample enough indications that she cannot profit from nor organize psychotherapy without reestablishing antipsychotic and/or mood stabilizing  medication, I am designating her not to reschedule unless or until we can have a direct phone assessment to update and determine suitability for services.  In consideration of her apparent competence to organize services, will waive NS fee and send termination letter.  Nancy Fries, PhD Nancy Czar, PhD LP Clinical  Psychologist, [facility] [address] [place], [place] [address] [phone]

## 2023-07-22 ENCOUNTER — Telehealth: Payer: Self-pay | Admitting: Psychiatry

## 2023-07-22 NOTE — Telephone Encounter (Signed)
Nancy Drake came by the office today. She was noticably psychotic, talking off of the wall, but requested to make an appt with you. Please advise. She left her cell # [phone] and number of [name], her ex spouse and POA [phone].

## 2023-07-23 NOTE — Telephone Encounter (Signed)
Well do you want to get a term letter going? I will just wait to see if she calls back. Most of the time, she doesn't.
